# Patient Record
Sex: Female | Born: 1985
Health system: Southern US, Community
[De-identification: ages and names within clinical notes are randomized; demographics above are authoritative.]

## PROBLEM LIST (undated history)

## (undated) DIAGNOSIS — F411 Generalized anxiety disorder: Secondary | ICD-10-CM

## (undated) DIAGNOSIS — Z8659 Personal history of other mental and behavioral disorders: Secondary | ICD-10-CM

## (undated) DIAGNOSIS — G5692 Unspecified mononeuropathy of left upper limb: Secondary | ICD-10-CM

## (undated) DIAGNOSIS — R002 Palpitations: Secondary | ICD-10-CM

## (undated) DIAGNOSIS — G8929 Other chronic pain: Principal | ICD-10-CM

## (undated) DIAGNOSIS — K219 Gastro-esophageal reflux disease without esophagitis: Secondary | ICD-10-CM

## (undated) DIAGNOSIS — E041 Nontoxic single thyroid nodule: Secondary | ICD-10-CM

## (undated) DIAGNOSIS — F909 Attention-deficit hyperactivity disorder, unspecified type: Secondary | ICD-10-CM

## (undated) DIAGNOSIS — F419 Anxiety disorder, unspecified: Secondary | ICD-10-CM

## (undated) DIAGNOSIS — Z972 Presence of dental prosthetic device (complete) (partial): Secondary | ICD-10-CM

## (undated) DIAGNOSIS — I10 Essential (primary) hypertension: Secondary | ICD-10-CM

## (undated) DIAGNOSIS — Z973 Presence of spectacles and contact lenses: Secondary | ICD-10-CM

## (undated) DIAGNOSIS — F32A Depression, unspecified: Secondary | ICD-10-CM

## (undated) DIAGNOSIS — I7300 Raynaud's syndrome without gangrene: Secondary | ICD-10-CM

## (undated) DIAGNOSIS — F329 Major depressive disorder, single episode, unspecified: Secondary | ICD-10-CM

## (undated) HISTORY — DX: Raynaud's syndrome without gangrene: I73.00

## (undated) HISTORY — PX: WISDOM TOOTH EXTRACTION: SHX21

---
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
## 2007-05-10 ENCOUNTER — Other Ambulatory Visit: Admission: RE | Admit: 2007-05-10 | Discharge: 2007-05-10 | Payer: Self-pay | Admitting: Family Medicine

## 2008-05-22 ENCOUNTER — Other Ambulatory Visit: Admission: RE | Admit: 2008-05-22 | Discharge: 2008-05-22 | Payer: Self-pay | Admitting: Family Medicine

## 2009-06-25 ENCOUNTER — Other Ambulatory Visit: Admission: RE | Admit: 2009-06-25 | Discharge: 2009-06-25 | Payer: Self-pay | Admitting: Family Medicine

## 2010-06-16 ENCOUNTER — Other Ambulatory Visit
Admission: RE | Admit: 2010-06-16 | Discharge: 2010-06-16 | Payer: Self-pay | Source: Home / Self Care | Admitting: Family Medicine

## 2011-06-29 ENCOUNTER — Other Ambulatory Visit: Payer: Self-pay | Admitting: Family Medicine

## 2011-06-29 ENCOUNTER — Other Ambulatory Visit (HOSPITAL_COMMUNITY)
Admission: RE | Admit: 2011-06-29 | Discharge: 2011-06-29 | Disposition: A | Discharge status: Home / Self Care | Payer: BC Managed Care – PPO | Source: Ambulatory Visit | Attending: Family Medicine | Admitting: Family Medicine

## 2011-06-29 DIAGNOSIS — Z124 Encounter for screening for malignant neoplasm of cervix: Secondary | ICD-10-CM | POA: Insufficient documentation

## 2011-06-29 DIAGNOSIS — Z1159 Encounter for screening for other viral diseases: Secondary | ICD-10-CM | POA: Insufficient documentation

## 2012-07-19 ENCOUNTER — Other Ambulatory Visit (HOSPITAL_COMMUNITY)
Admission: RE | Admit: 2012-07-19 | Discharge: 2012-07-19 | Disposition: A | Discharge status: Home / Self Care | Payer: BC Managed Care – PPO | Source: Ambulatory Visit | Attending: Family Medicine | Admitting: Family Medicine

## 2012-07-19 ENCOUNTER — Other Ambulatory Visit: Payer: Self-pay | Admitting: Family Medicine

## 2012-07-19 DIAGNOSIS — Z1151 Encounter for screening for human papillomavirus (HPV): Secondary | ICD-10-CM | POA: Insufficient documentation

## 2012-07-19 DIAGNOSIS — Z124 Encounter for screening for malignant neoplasm of cervix: Secondary | ICD-10-CM | POA: Insufficient documentation

## 2012-07-19 DIAGNOSIS — R87810 Cervical high risk human papillomavirus (HPV) DNA test positive: Secondary | ICD-10-CM | POA: Insufficient documentation

## 2013-08-22 ENCOUNTER — Other Ambulatory Visit (HOSPITAL_COMMUNITY)
Admission: RE | Admit: 2013-08-22 | Discharge: 2013-08-22 | Disposition: A | Discharge status: Home / Self Care | Payer: BC Managed Care – PPO | Source: Ambulatory Visit | Attending: Family Medicine | Admitting: Family Medicine

## 2013-08-22 ENCOUNTER — Other Ambulatory Visit: Payer: Self-pay | Admitting: Family Medicine

## 2013-08-22 DIAGNOSIS — Z124 Encounter for screening for malignant neoplasm of cervix: Secondary | ICD-10-CM | POA: Insufficient documentation

## 2014-08-24 ENCOUNTER — Other Ambulatory Visit: Payer: Self-pay | Admitting: Family Medicine

## 2014-08-24 ENCOUNTER — Other Ambulatory Visit (HOSPITAL_COMMUNITY)
Admission: RE | Admit: 2014-08-24 | Discharge: 2014-08-24 | Disposition: A | Discharge status: Home / Self Care | Payer: No Typology Code available for payment source | Source: Ambulatory Visit | Attending: Family Medicine | Admitting: Family Medicine

## 2014-08-24 DIAGNOSIS — Z124 Encounter for screening for malignant neoplasm of cervix: Secondary | ICD-10-CM | POA: Insufficient documentation

## 2014-08-30 LAB — CYTOLOGY - PAP

## 2015-07-15 ENCOUNTER — Encounter (HOSPITAL_COMMUNITY): Payer: Self-pay

## 2015-07-15 ENCOUNTER — Emergency Department (INDEPENDENT_AMBULATORY_CARE_PROVIDER_SITE_OTHER)
Admission: EM | Admit: 2015-07-15 | Discharge: 2015-07-15 | Disposition: A | Discharge status: Home / Self Care | Payer: BLUE CROSS/BLUE SHIELD | Source: Home / Self Care

## 2015-07-15 DIAGNOSIS — J069 Acute upper respiratory infection, unspecified: Secondary | ICD-10-CM

## 2015-07-15 LAB — POCT RAPID STREP A: Streptococcus, Group A Screen (Direct): NEGATIVE

## 2015-07-15 MED ORDER — HYDROCODONE-HOMATROPINE 5-1.5 MG/5ML PO SYRP: 5.0000 mL | ORAL_SOLUTION | Freq: Four times a day (QID) | ORAL | Status: DC | PRN

## 2015-07-15 NOTE — Discharge Instructions (Signed)
Upper Respiratory Infection, Adult We have diagnosed U with a mild upper respiratory infection Your strep test was non-suggestive of strep throat There are many causes for upper respiratory illness and most of them are viral Please contact your physician if you or no better in a week and consider getting a chest x-ray and labs however from my standpoint I think you're stable and we will send home on a cough suppressant   Most upper respiratory infections (URIs) are a viral infection of the air passages leading to the lungs. A URI affects the nose, throat, and upper air passages. The most common type of URI is nasopharyngitis and is typically referred to as "the common cold." URIs run their course and usually go away on their own. Most of the time, a URI does not require medical attention, but sometimes a bacterial infection in the upper airways can follow a viral infection. This is called a secondary infection. Sinus and middle ear infections are common types of secondary upper respiratory infections. Bacterial pneumonia can also complicate a URI. A URI can worsen asthma and chronic obstructive pulmonary disease (COPD). Sometimes, these complications can require emergency medical care and may be life threatening.  CAUSES Almost all URIs are caused by viruses. A virus is a type of germ and can spread from one person to another.  RISKS FACTORS You may be at risk for a URI if:   You smoke.   You have chronic heart or lung disease.  You have a weakened defense (immune) system.   You are very young or very old.   You have nasal allergies or asthma.  You work in crowded or poorly ventilated areas.  You work in health care facilities or schools. SIGNS AND SYMPTOMS  Symptoms typically develop 2-3 days after you come in contact with a cold virus. Most viral URIs last 7-10 days. However, viral URIs from the influenza virus (flu virus) can last 14-18 days and are typically more severe. Symptoms  may include:   Runny or stuffy (congested) nose.   Sneezing.   Cough.   Sore throat.   Headache.   Fatigue.   Fever.   Loss of appetite.   Pain in your forehead, behind your eyes, and over your cheekbones (sinus pain).  Muscle aches.  DIAGNOSIS  Your health care provider may diagnose a URI by:  Physical exam.  Tests to check that your symptoms are not due to another condition such as:  Strep throat.  Sinusitis.  Pneumonia.  Asthma. TREATMENT  A URI goes away on its own with time. It cannot be cured with medicines, but medicines may be prescribed or recommended to relieve symptoms. Medicines may help:  Reduce your fever.  Reduce your cough.  Relieve nasal congestion. HOME CARE INSTRUCTIONS   Take medicines only as directed by your health care provider.   Gargle warm saltwater or take cough drops to comfort your throat as directed by your health care provider.  Use a warm mist humidifier or inhale steam from a shower to increase air moisture. This may make it easier to breathe.  Drink enough fluid to keep your urine clear or pale yellow.   Eat soups and other clear broths and maintain good nutrition.   Rest as needed.   Return to work when your temperature has returned to normal or as your health care provider advises. You may need to stay home longer to avoid infecting others. You can also use a face mask and careful hand  washing to prevent spread of the virus.  Increase the usage of your inhaler if you have asthma.   Do not use any tobacco products, including cigarettes, chewing tobacco, or electronic cigarettes. If you need help quitting, ask your health care provider. PREVENTION  The best way to protect yourself from getting a cold is to practice good hygiene.   Avoid oral or hand contact with people with cold symptoms.   Wash your hands often if contact occurs.  There is no clear evidence that vitamin C, vitamin E, echinacea, or  exercise reduces the chance of developing a cold. However, it is always recommended to get plenty of rest, exercise, and practice good nutrition.  SEEK MEDICAL CARE IF:   You are getting worse rather than better.   Your symptoms are not controlled by medicine.   You have chills.  You have worsening shortness of breath.  You have brown or red mucus.  You have yellow or brown nasal discharge.  You have pain in your face, especially when you bend forward.  You have a fever.  You have swollen neck glands.  You have pain while swallowing.  You have white areas in the back of your throat. SEEK IMMEDIATE MEDICAL CARE IF:   You have severe or persistent:  Headache.  Ear pain.  Sinus pain.  Chest pain.  You have chronic lung disease and any of the following:  Wheezing.  Prolonged cough.  Coughing up blood.  A change in your usual mucus.  You have a stiff neck.  You have changes in your:  Vision.  Hearing.  Thinking.  Mood. MAKE SURE YOU:   Understand these instructions.  Will watch your condition.  Will get help right away if you are not doing well or get worse.   This information is not intended to replace advice given to you by your health care provider. Make sure you discuss any questions you have with your health care provider.   Document Released: 12/23/2000 Document Revised: 11/13/2014 Document Reviewed: 10/04/2013 Elsevier Interactive Patient Education [employer]

## 2015-07-15 NOTE — ED Notes (Signed)
Patient complains of having a cough for the past  Five days Not able to sleep because all she does is cough Patient states having fever on an off as well

## 2015-07-15 NOTE — ED Provider Notes (Signed)
CSN: [id number]     Arrival date & time 07/15/15  1305 History   None    Chief Complaint  Patient presents with  . Cough    HPI   30 year old female No significant past medical history presents with cold Tells me that when she has a cold this is typical for her She has had however fever as high as 101 No sick contacts Works in [employer] therefore exposed to many sick people No myalgias No chest pain however has pain on coughing No nausea No vomiting No blurred or double vision however does feel some throat tenderness and discomfort and feels like her glands are swollen No dysuria No diarrhea   History reviewed. No pertinent past medical history. History reviewed. No pertinent past surgical history. No family history on file. Social History  Substance Use Topics  . Smoking status: None  . Smokeless tobacco: None  . Alcohol Use: None   OB History    No data available     Review of Systems  Allergies  Review of patient's allergies indicates no known allergies.  Home Medications   Prior to Admission medications   Not on File   Meds Ordered and Administered this Visit  Medications - No data to display  BP 131/80 mmHg  Pulse 97  Temp(Src) 98.6 F (37 C) (Oral)  Resp 18  SpO2 100% No data found.   Physical Exam EOMI NCAT slightly tired-appearing female no icterus no pallor Slightly injected bilateral fauces TMs are bilaterally slightly swollen however no insufflation test was done Chest is clinically clear with no added sound no rails no rhonchi Abdomen is soft nontender nondistended no rebound No lower extremity edema   ED Course  Procedures (including critical care time)  Labs Review Labs Reviewed - No data to display  Imaging Review No results found.   Visual Acuity Review  Right Eye Distance:   Left Eye Distance:   Bilateral Distance:    Right Eye Near:   Left Eye Near:    Bilateral Near:         MDM  No diagnosis  found. Impression  Possible upper respiratory illness likely viral We will get rapid strep I will prescribe dextromethorphan to reduce cough symptoms at night  Her strep test is negative and therefore we will treat this as a viral illness Return precautions have been stressed She should follow-up with her primary care physician if no better within the next 2-4 days  Pleas KochJai Nikalas Bramel, MD [facility] [phone]) [phone]     Rhetta MuraJai-Gurmukh Dawnette Mione, MD 07/15/15 1421

## 2015-07-17 LAB — CULTURE, GROUP A STREP

## 2015-08-29 ENCOUNTER — Other Ambulatory Visit (HOSPITAL_COMMUNITY)
Admission: RE | Admit: 2015-08-29 | Discharge: 2015-08-29 | Disposition: A | Discharge status: Home / Self Care | Payer: BLUE CROSS/BLUE SHIELD | Source: Ambulatory Visit | Attending: Family Medicine | Admitting: Family Medicine

## 2015-08-29 ENCOUNTER — Other Ambulatory Visit: Payer: Self-pay | Admitting: Family Medicine

## 2015-08-29 DIAGNOSIS — Z01411 Encounter for gynecological examination (general) (routine) with abnormal findings: Secondary | ICD-10-CM | POA: Diagnosis present

## 2015-09-02 LAB — CYTOLOGY - PAP

## 2016-06-03 DIAGNOSIS — M5412 Radiculopathy, cervical region: Secondary | ICD-10-CM | POA: Diagnosis not present

## 2016-06-03 DIAGNOSIS — G5602 Carpal tunnel syndrome, left upper limb: Secondary | ICD-10-CM | POA: Diagnosis not present

## 2016-06-22 DIAGNOSIS — I7300 Raynaud's syndrome without gangrene: Secondary | ICD-10-CM | POA: Diagnosis not present

## 2016-06-22 DIAGNOSIS — M5412 Radiculopathy, cervical region: Secondary | ICD-10-CM | POA: Diagnosis not present

## 2016-06-27 ENCOUNTER — Encounter (HOSPITAL_COMMUNITY): Payer: Self-pay | Admitting: Emergency Medicine

## 2016-06-27 ENCOUNTER — Emergency Department (HOSPITAL_COMMUNITY)
Admission: EM | Admit: 2016-06-27 | Discharge: 2016-06-27 | Disposition: A | Discharge status: Home / Self Care | Payer: BLUE CROSS/BLUE SHIELD | Attending: Emergency Medicine | Admitting: Emergency Medicine

## 2016-06-27 DIAGNOSIS — G5602 Carpal tunnel syndrome, left upper limb: Secondary | ICD-10-CM | POA: Insufficient documentation

## 2016-06-27 DIAGNOSIS — M25512 Pain in left shoulder: Secondary | ICD-10-CM | POA: Diagnosis not present

## 2016-06-27 MED ORDER — LORAZEPAM 0.5 MG PO TABS: 1.0000 mg | ORAL_TABLET | Freq: Three times a day (TID) | ORAL | 0 refills | Status: DC | PRN

## 2016-06-27 MED ORDER — HYDROCODONE-ACETAMINOPHEN 5-325 MG PO TABS: 2.0000 | ORAL_TABLET | Freq: Four times a day (QID) | ORAL | 0 refills | Status: DC | PRN

## 2016-06-27 NOTE — Discharge Instructions (Signed)
Please read attached information. If you experience any new or worsening signs or symptoms please return to the emergency room for evaluation. Please follow-up with your primary care provider or specialist as discussed. Please use medication prescribed only as directed and discontinue taking if you have any concerning signs or symptoms. Do not take prescribed medications together as they can cause significant life-threatening complications. Please allow at least 6 hours in between medication administration.

## 2016-06-27 NOTE — ED Provider Notes (Signed)
[facility]-EMERGENCY DEPT Provider Note   CSN: [id number] Arrival date & time: 06/27/16  1616  By signing my name below, I, Vista Minkobert Ross, attest that this documentation has been prepared under the direction and in the presence of H&R BlockJeffrey Anaysha Andre PA-C.  Electronically Signed: Vista Minkobert Ross, ED Scribe. 06/27/16. 6:45 PM.  History   Chief Complaint Chief Complaint  Patient presents with  . Shoulder Pain  . Hand Pain   HPI HPI Comments: Michelle Villa is a 30 y.o. female, with no pertinent PMHx, who presents to the Emergency Department complaining of gradually worsening, constant left shoulder and left hand pain and intermittent numbness that started approximately 3 weeks ago. Pt states that these symptoms started as a dull ache in her left shoulder for one week and has progressively gotten worse, started to have numbness and pain in her left hand. Pt states, "it feels like there is a knot below my left shoulder blade". Pt saw her PCP immediately when the intermittent numbness in her fingers started approximately 3 weeks ago. She was prescribed Prednisone and Hydrocodone for her symptoms which only mildly alleviates the symptoms for a short period. She is scheduled to follow up with Neurologist, Dr. Lucia GaskinsAhern, on 07/21/15.   The history is provided by the patient. No language interpreter was used.    History reviewed. No pertinent past medical history.  There are no active problems to display for this patient.   Past Surgical History:  Procedure Laterality Date  . WISDOM TOOTH EXTRACTION      OB History    No data available       Home Medications    Prior to Admission medications   Medication Sig Start Date End Date Taking? Authorizing Provider  HYDROcodone-acetaminophen (NORCO/VICODIN) 5-325 MG tablet Take 2 tablets by mouth every 6 (six) hours as needed. 06/27/16   Tinnie GensJeffrey Mustapha Colson, PA-C  HYDROcodone-homatropine (HYCODAN) 5-1.5 MG/5ML syrup Take 5 mLs by mouth every 6 (six) hours as  needed for cough. 07/15/15   Rhetta MuraJai-Gurmukh Samtani, MD  LORazepam (ATIVAN) 0.5 MG tablet Take 2 tablets (1 mg total) by mouth 3 (three) times daily as needed for anxiety. 06/27/16   Eyvonne MechanicJeffrey Edi Gorniak, PA-C    Family History Family History  Problem Relation Age of Onset  . Hypertension Father   . Hyperlipidemia Father     Social History Social History  Substance Use Topics  . Smoking status: Never Smoker  . Smokeless tobacco: Never Used  . Alcohol use 0.6 oz/week    1 Glasses of wine per week    Allergies   Patient has no known allergies.   Review of Systems Review of Systems A complete 10 system review of systems was obtained and all systems are negative except as noted in the HPI and PMH.    Physical Exam Updated Vital Signs BP 150/94 (BP Location: Right Arm)   Pulse 95   Temp 98.7 F (37.1 C) (Oral)   Resp 18   Ht 5\' 3"  (1.6 m)   Wt 131 lb 2 oz (59.5 kg)   LMP 05/30/2016 (Approximate)   SpO2 100%   BMI 23.23 kg/m   Physical Exam  Constitutional: She is oriented to person, place, and time. She appears well-developed and well-nourished. No distress.  HENT:  Head: Normocephalic and atraumatic.  Neck: Normal range of motion.  Pulmonary/Chest: Effort normal.  Musculoskeletal: Normal range of motion. She exhibits tenderness.  TTP of the left trapezius and surrounding scapular musculature. No rashes, swelling or deformities noted. Full active  ROM. No pain or neuro with axial compression.   Left arm grip strength 5/5, sensation grossly intact. Reduced sensation 1st,2nd and 3rd digits. Minor pain to compression over carpal tunnel.  Neurological: She is alert and oriented to person, place, and time.  Skin: Skin is warm and dry. She is not diaphoretic.  Psychiatric: She has a normal mood and affect. Judgment normal.  Nursing note and vitals reviewed.   ED Treatments / Results  DIAGNOSTIC STUDIES: Oxygen Saturation is 100% on RA, normal by my  interpretation.  COORDINATION OF CARE: 6:44 PM-Discussed treatment plan with pt at bedside and pt agreed to plan.   Labs (all labs ordered are listed, but only abnormal results are displayed) Labs Reviewed - No data to display  EKG  EKG Interpretation None       Radiology No results found.  Procedures Procedures (including critical care time)  Medications Ordered in ED Medications - No data to display   Initial Impression / Assessment and Plan / ED Course  I have reviewed the triage vital signs and the nursing notes.  Pertinent labs & imaging results that were available during my care of the patient were reviewed by me and considered in my medical decision making (see chart for details).  Clinical Course     Patient presents with pain in her shoulder and arm and hand. She has associated numbness in the first 3 digits. Patient is very anxious, and as her exam goes on she becomes more and more anxious. She is very concerned about the pain, and does not provide a clear history. She originally noted pain followed by neurological complaints at week later, later in the exam she reports typical started the same time. Patient has a normal neuro exam with slightly decreased sensation of the first 3 digits. She also has pain over the carpal tunnel. Patient most concerned about being able to continue exercising and working. I have low suspicion for significant pathology in this patient, likely muscle spasm or impingement. She has no involvement of the neck or spine. Patient and he has a neurology follow-up, she is encouraged to follow up with a neurologist. Due to significant anxiety and questionable spasm of the scapular muscle  she will be given a benzodiazepine. Patient will also be given short course of hydrocodone. She was extensively counseled on not using these 2 medications together as they can potentially cause life-threatening problems. Both the patient and her fianc verbalized her  understanding and agreement to today's plan had no further questions or concerns at time of discharge  Final Clinical Impressions(s) / ED Diagnoses   Final diagnoses:  Acute pain of left shoulder  Carpal tunnel syndrome of left wrist    New Prescriptions Discharge Medication List as of 06/27/2016  7:00 PM    START taking these medications   Details  HYDROcodone-acetaminophen (NORCO/VICODIN) 5-325 MG tablet Take 2 tablets by mouth every 6 (six) hours as needed., Starting Sat 06/27/2016, Print    LORazepam (ATIVAN) 0.5 MG tablet Take 2 tablets (1 mg total) by mouth 3 (three) times daily as needed for anxiety., Starting Sat 06/27/2016, Print        I personally performed the services described in this documentation, which was scribed in my presence. The recorded information has been reviewed and is accurate.    Eyvonne MechanicJeffrey Auther Lyerly, PA-C 06/28/16 1115    Donnetta HutchingBrian Cook, MD 07/03/16 [phone]

## 2016-06-27 NOTE — ED Triage Notes (Signed)
Pt reports pain in l/shoulder radiating to l/arm and hand x 3 weeks. PCP scheduled appointment for January 6 with neurologist. Pt has been using pain medication for spasms in l/hand and arm. Pt is requesting evaluation for harp pains in hands today.

## 2016-07-02 DIAGNOSIS — G5602 Carpal tunnel syndrome, left upper limb: Secondary | ICD-10-CM | POA: Diagnosis not present

## 2016-07-13 DIAGNOSIS — I7789 Other specified disorders of arteries and arterioles: Secondary | ICD-10-CM

## 2016-07-13 DIAGNOSIS — E042 Nontoxic multinodular goiter: Secondary | ICD-10-CM

## 2016-07-13 DIAGNOSIS — I999 Unspecified disorder of circulatory system: Secondary | ICD-10-CM

## 2016-07-13 HISTORY — DX: Nontoxic multinodular goiter: E04.2

## 2016-07-13 HISTORY — DX: Other specified disorders of arteries and arterioles: I77.89

## 2016-07-13 HISTORY — DX: Unspecified disorder of circulatory system: I99.9

## 2016-07-20 ENCOUNTER — Encounter: Payer: Self-pay | Admitting: Neurology

## 2016-07-20 ENCOUNTER — Ambulatory Visit (INDEPENDENT_AMBULATORY_CARE_PROVIDER_SITE_OTHER): Payer: BLUE CROSS/BLUE SHIELD | Admitting: Neurology

## 2016-07-20 VITALS — BP 132/85 | HR 91 | Resp 20 | Ht 63.0 in | Wt 146.0 lb

## 2016-07-20 DIAGNOSIS — I7300 Raynaud's syndrome without gangrene: Secondary | ICD-10-CM | POA: Diagnosis not present

## 2016-07-20 DIAGNOSIS — D7589 Other specified diseases of blood and blood-forming organs: Secondary | ICD-10-CM

## 2016-07-20 DIAGNOSIS — M5010 Cervical disc disorder with radiculopathy, unspecified cervical region: Secondary | ICD-10-CM

## 2016-07-20 DIAGNOSIS — I742 Embolism and thrombosis of arteries of the upper extremities: Secondary | ICD-10-CM

## 2016-07-20 DIAGNOSIS — R202 Paresthesia of skin: Secondary | ICD-10-CM

## 2016-07-20 DIAGNOSIS — R29898 Other symptoms and signs involving the musculoskeletal system: Secondary | ICD-10-CM | POA: Diagnosis not present

## 2016-07-20 NOTE — Patient Instructions (Addendum)
Remember to drink plenty of fluid, eat healthy meals and do not skip any meals. Try to eat protein with a every meal and eat a healthy snack such as fruit or nuts in between meals. Try to keep a regular sleep-wake schedule and try to exercise daily, particularly in the form of walking, 20-30 minutes a day, if you can.   As far as diagnostic testing: MRI cervical spine, emg/ncs and physical therpay  I would like to see you back for emg/ncs, sooner if we need to. Please call us with any interim questions, concerns, problems, updates or refill requests.   Do not lift more than 5 pounds.   Our phone number is [phone]. We also have an after hours call service for urgent matters and there is a physician on-call for urgent questions. For any emergencies you know to call 911 or go to the nearest emergency room

## 2016-07-20 NOTE — Progress Notes (Addendum)
[name]    Provider:  Dr Lucia Gaskins Referring Provider: Shirlean Mylar, MD Primary Care Physician:  Frederich Chick, MD  CC:  Constant hand/shoulder pain, cold fingers go numb  Addendum: emg/ncs was negative. US revealed Ulnar artery thrombosis and vascular consult pending. MRI of the cervical spine shows fatty marrow changes. Artery occlusion and cervical fatty marrow changes both highly unusual in a 31 year old otherwise healthy female. Will refer to hematology for evaluation.   HPI:  Michelle Villa is a 31 y.o. female here as a referral from Dr. Hyman Hopes for possible cervical radiculopathy. Past medical history of dysmenorrhea, is also a Academic librarian and takes vitamin B12.Her left 3 fingers go blue, digits 1-3 and hurt. It started without inciting events except maybe she has slipped multiple times at work and symptoms started before Thanksgiving and progressively worsening. She also has pain in her shoulder she has a knot on the back and when she presses on it it feels better but makes her fingers tingle. She feels it constantly. Hurts with use and radiates into the forearm and hand. Her left hand feels like a block of ice. She wakes up in the middle of the night with pain in her hand. She has gone to a chiropractor, had massages, nothing is helping maybe temporarily but nothing has resolved the symptoms. The pain starts in the back of the left shoulder. It can be severe in pain. She feels her left arm is weak and she is having difficulty picking up objects and drops things. It is affecting her life as she works in [employer] and difficulty with serving food. Digits on the left hand 1-3 feel tingling, numb, cold, she sleeps with a carpal tunnel brace at night but still feels like her hand is falling asleep. Progressively worsening and continuous all day long. She has had at least 6 weeks of chiropractor, massage and medical management with steroids and percocet.   Reviewed notes, labs and  imaging from outside physicians, which showed:  Reviewed primary care notes. Patient complains of pain that starts her left shoulder blade and radiates down her left arm and left hand. Her left hand is much colder than her right hand. Patient's been seen several times for the same issue. Pain is becoming even worse and more persistent. She was given hydrocodone acetaminophen, prednisone, and referral with neurology and an EMG test was recommended. An MRI of the cervical spine was also ordered. She was diagnosed with Raynaud's phenomena, noted this is benign, good blood flow and exam, advised to keep hands warm, also given alprazolam before MRI. Patient was also seen in the emergency room 06/27/2016 with gradually worsening constant left shoulder and left hand pain and intermittent numbness. She reported a dull ache in the left shoulder for 1 week and numbness and pain in her left hand. She was prescribed prednisone and hydrocodone for her symptoms which only mildly alleviated symptoms. Exam showed tenderness of the left trapezius since around the scapular musculature. No rashes swelling or deformities noted. Full active range of motion.  Labs: rapid strep 07/15/2015 negative  Review of Systems: Patient complains of symptoms per HPI as well as the following symptoms: Numbness, weakness, trouble sleeping. . Pertinent negatives per HPI. All others negative.   Social History   Social History  . Marital status: Single    Spouse name: N/A  . Number of children: N/A  . Years of education: N/A   Occupational History  . waitress  Social History Main Topics  . Smoking status: Never Smoker  . Smokeless tobacco: Never Used  . Alcohol use 4.2 oz/week    7 Standard drinks or equivalent per week  . Drug use:     Types: Marijuana  . Sexual activity: Not on file   Other Topics Concern  . Not on file   Social History Narrative  . No narrative on file    Family History  Problem Relation Age of  Onset  . Hypertension Father   . Hyperlipidemia Father   . Neuropathy Neg Hx     Past Medical History:  Diagnosis Date  . Raynaud phenomenon     Past Surgical History:  Procedure Laterality Date  . WISDOM TOOTH EXTRACTION      Current Outpatient Prescriptions  Medication Sig Dispense Refill  . Cyanocobalamin (VITAMIN B-12) 3000 MCG SUBL Place under the tongue.    . drospirenone-ethinyl estradiol (YASMIN,ZARAH,SYEDA) 3-0.03 MG tablet Take 1 tablet by mouth daily.    Marland Kitchen. ibuprofen (ADVIL,MOTRIN) 100 MG tablet Take 100 mg by mouth every 6 (six) hours as needed for fever.    . Multiple Vitamins-Minerals (ECHINACEA ACZ PO) Take by mouth.    . Multiple Vitamins-Minerals (MULTI COMPLETE PO) Take by mouth.     No current facility-administered medications for this visit.     Allergies as of 07/20/2016  . (No Known Allergies)    Vitals: BP 132/85   Pulse 91   Resp 20   Ht 5\' 3"  (1.6 m)   Wt 146 lb (66.2 kg)   LMP 05/30/2016 (Approximate)   BMI 25.86 kg/m  Last Weight:  Wt Readings from Last 1 Encounters:  07/20/16 146 lb (66.2 kg)   Last Height:   Ht Readings from Last 1 Encounters:  07/20/16 5\' 3"  (1.6 m)   Physical exam: Exam: Gen: NAD, conversant, well nourised, obese, well groomed                     CV: RRR, no MRG. No Carotid Bruits. No peripheral edema, warm, nontender Eyes: Conjunctivae clear without exudates or hemorrhage  Neuro: Detailed Neurologic Exam  Speech:    Speech is normal; fluent and spontaneous with normal comprehension.  Cognition:    The patient is oriented to person, place, and time;     recent and remote memory intact;     language fluent;     normal attention, concentration,     fund of knowledge Cranial Nerves:    The pupils are equal, round, and reactive to light. The fundi are normal and spontaneous venous pulsations are present. Visual fields are full to finger confrontation. Extraocular movements are intact. Trigeminal sensation is  intact and the muscles of mastication are normal. The face is symmetric. The palate elevates in the midline. Hearing intact. Voice is normal. Shoulder shrug is normal. The tongue has normal motion without fasciculations.   Coordination:    Normal finger to nose and heel to shin. Normal rapid alternating movements.   Gait:    Heel-toe and tandem gait are normal.   Motor Observation:    No asymmetry, no atrophy, and no involuntary movements noted.Tenderness of palpation of the left trapezius and scapular muscles, full range of motion  Tone:    Normal muscle tone.    Posture:    Posture is normal. normal erect    Strength:    Left deltoid 4/5, decreased grip strength and interossei/opponens strength of the left hand.  Sensation: dec left hand     Reflex Exam:  DTR's:    Deep tendon reflexes in the upper and lower extremities are normal bilaterally.   Toes:    The toes are downgoing bilaterally.   Clonus:    Clonus is absent.  + Tinel sign and +Phalen's maneuver     Assessment/Plan:  31 year old with 2 months of left hand paresthesias and weakness, left arm weakness, radiating pain, she has tried medical management, chiropractor, massage therapy for at least 6 weeks. Needs MRI of the cervical spine to evaluate for cervical radiculopathy for surgical intervention or epidural steroid injection therapy, also an EMG nerve conduction study of the bilateral upper extremities, we will refer to physical therapy as well. Do not lift more than 5 punds, continue to wear wrist braces at night. + Tinel sign and +Phalen's maneuver.  Addendum: emg/ncs was negative. US revealed Ulnar artery thrombosis and vascular consult pending. MRI of the cervical spine shows fatty marrow changes. Artery occlusion and cervical fatty marrow changes both highly unusual in a 31 year old otherwise healthy female. Will refer to hematology for evaluation.    Cc: Dr. Yolande Jolly, MD  [facility] [address] [place], [place] [address]  Phone [phone] Fax [phone]

## 2016-07-21 ENCOUNTER — Telehealth: Payer: Self-pay | Admitting: Neurology

## 2016-07-21 ENCOUNTER — Telehealth: Payer: Self-pay | Admitting: *Deleted

## 2016-07-21 NOTE — Telephone Encounter (Signed)
Called and spoke to pt fiance (on DPR), Sharlet SalinaBenjamin. Advised we have openings for appt tomorrow, check in 315pm, Thursday, check in 930am and Friday, check in 8am. He is going to try and get a hold of the patient and call back to let us know if these work. Advised them to call [facility] phone number. He verbalized understanding.

## 2016-07-21 NOTE — Telephone Encounter (Signed)
Called and LVM for pt to call back. Offered EMG.NCS appt with Dr Terrace ArabiaYan this Friday at 815am, check in 800am. Asked her to call back ASAP to let us know if this works. Gave [facility] phone number.

## 2016-07-21 NOTE — Telephone Encounter (Signed)
Pt returned Emma's call  She said 8 am on Friday will work

## 2016-07-21 NOTE — Telephone Encounter (Signed)
[employer] did not approve the MRI, [facility] and she said she was unable to approve it on her level due to the patient has not had 6 weeks of physical therapy. The phone number for the peer to peer is [phone] and the member ID is [id number] & DOB is May 11, 1986. Please do in 2 business days. Thank you for your help!

## 2016-07-21 NOTE — Telephone Encounter (Signed)
Schedule pt for Friday at 815am with Dr Terrace ArabiaYan. Pt advised to check in at 8am.

## 2016-07-21 NOTE — Telephone Encounter (Signed)
LVM again for pt to call and schedule EMG/NCS. Gave [facility] phone number.

## 2016-07-22 ENCOUNTER — Telehealth: Payer: Self-pay | Admitting: Neurology

## 2016-07-22 NOTE — Telephone Encounter (Signed)
Patient is scheduled to have her MRI on 07/29/16 at our [facility] mobile unit. But she informed me that she is somewhat claustrophobia and needs something to calm her nerves.

## 2016-07-22 NOTE — Telephone Encounter (Signed)
Dr Lucia GaskinsAhern- ok to give xanax for MRI?

## 2016-07-23 ENCOUNTER — Ambulatory Visit: Payer: BLUE CROSS/BLUE SHIELD | Admitting: Physical Therapy

## 2016-07-23 MED ORDER — ALPRAZOLAM 0.5 MG PO TABS: ORAL_TABLET | ORAL | 0 refills | Status: DC

## 2016-07-23 NOTE — Telephone Encounter (Signed)
Noted, thank you for your help!  °

## 2016-07-23 NOTE — Telephone Encounter (Signed)
Faxed printed/signed rx to pt pharmacy. Received confirmation. 

## 2016-07-23 NOTE — Telephone Encounter (Signed)
Called and spoke to pt. Advised we sent in rx xanax to take prior to MRI. Went over instructions and advised she must have a driver to and from the test. Can cause sedation. She verbalized understanding.

## 2016-07-23 NOTE — Telephone Encounter (Signed)
Printed rx, awaiting AA,MD signature 

## 2016-07-23 NOTE — Telephone Encounter (Signed)
That's fine. thanks

## 2016-07-23 NOTE — Telephone Encounter (Signed)
Authorized [id number] [phone] 2018

## 2016-07-24 ENCOUNTER — Ambulatory Visit (INDEPENDENT_AMBULATORY_CARE_PROVIDER_SITE_OTHER): Payer: BLUE CROSS/BLUE SHIELD | Admitting: Neurology

## 2016-07-24 ENCOUNTER — Ambulatory Visit (INDEPENDENT_AMBULATORY_CARE_PROVIDER_SITE_OTHER): Payer: Self-pay | Admitting: Neurology

## 2016-07-24 DIAGNOSIS — R202 Paresthesia of skin: Secondary | ICD-10-CM | POA: Diagnosis not present

## 2016-07-24 DIAGNOSIS — R29898 Other symptoms and signs involving the musculoskeletal system: Secondary | ICD-10-CM

## 2016-07-24 DIAGNOSIS — M542 Cervicalgia: Secondary | ICD-10-CM | POA: Diagnosis not present

## 2016-07-24 DIAGNOSIS — Z0289 Encounter for other administrative examinations: Secondary | ICD-10-CM

## 2016-07-24 DIAGNOSIS — M5010 Cervical disc disorder with radiculopathy, unspecified cervical region: Secondary | ICD-10-CM

## 2016-07-24 NOTE — Procedures (Signed)
        Full Name: Michelle Villa Gender: Female MRN #: [id number] Date of Birth: [birth year]    Visit Date: 07/24/2016 08:22 Age: 31 Years 7 Months Old Examining Physician: Naomie DeanAntonia Ahern, MD  Referring Physician: Lucia GaskinsAhern History: 31 years old right-handed female presenting subacute onset of left first 3 finger numbness, purplish discoloration, subjective weakness, left neck pain.    Summary of test:  Nerve conduction study: Left median, ulnar sensory and motor responses were normal.  Electromyography: Selected needle examination of left upper extremity and left cervical paraspinal muscles was normal.  Conclusion: This is a normal study, there is no electrodiagnostic evidence of left upper extremity neuropathy or left cervical radiculopathy.    ------------------------------- Levert FeinsteinYijun Heidemarie Goodnow, M.D.  [facility] [address] [place], [place] [address] Tel: [phone] Fax: [phone]        [facility]    Nerve / Sites Rec. Site Peak Lat Ref.  Amp Ref. Segments Distance    ms ms V V  cm  L Median - Orthodromic (Dig II, Mid palm)     Dig II Wrist 2.8 ?3.4 33 ?10 Dig II - Wrist 13  L Ulnar - Orthodromic, (Dig V, Mid palm)     Dig V Wrist 2.6 ?3.1 15 ?5 Dig V - Wrist 11         [facility]    Nerve / Sites Muscle Latency Ref. Amplitude Ref. Rel Amp Segments Distance Lat Diff Velocity Ref. Area    ms ms mV mV %  cm ms m/s m/s mVms  L Median - APB     Wrist APB 2.9 ?4.4 8.0 ?4.0 100 Wrist - APB 7    36.3     Upper arm APB 6.7  8.2  102 Upper arm - Wrist 22 3.8 58  36.5  L Ulnar - ADM     Wrist ADM 2.3 ?3.3 10.5 ?6.0 100 Wrist - ADM 7    38.6     B.Elbow ADM 4.8  8.6  82.4 B.Elbow - Wrist 15 2.6 59 ?49 33.9     A.Elbow ADM 6.7  9.5  110 A.Elbow - B.Elbow 10 1.9 53 ?49 38.1         A.Elbow - Wrist  4.4            F  Wave    Nerve F Lat Ref.   ms ms  L Median - APB 24.7 ?31.0  L Ulnar - ADM 24.9 ?32.0         EMG full       EMG Summary Table    Spontaneous  MUAP Recruitment  Muscle IA Fib PSW Fasc Other Amp Dur. Poly Pattern  L. First dorsal interosseous Normal None None None _______ Normal Normal Normal Normal  L. Pronator teres Normal None None None _______ Normal Normal Normal Normal  L. Extensor digitorum communis Normal None None None _______ Normal Normal Normal Normal  L. Brachioradialis Normal None None None _______ Normal Normal Normal Normal  L. Biceps brachii Normal None None None _______ Normal Normal Normal Normal  L. Deltoid Normal None None None _______ Normal Normal Normal Normal  L. Triceps brachii Normal None None None _______ Normal Normal Normal Normal  L. Cervical paraspinals Normal None None None _______ Normal Normal Normal Normal

## 2016-07-27 ENCOUNTER — Encounter: Payer: Self-pay | Admitting: Physical Therapy

## 2016-07-27 ENCOUNTER — Ambulatory Visit: Payer: BLUE CROSS/BLUE SHIELD | Attending: Neurology | Admitting: Physical Therapy

## 2016-07-27 DIAGNOSIS — M542 Cervicalgia: Secondary | ICD-10-CM | POA: Insufficient documentation

## 2016-07-27 DIAGNOSIS — M6281 Muscle weakness (generalized): Secondary | ICD-10-CM | POA: Diagnosis not present

## 2016-07-27 DIAGNOSIS — M25512 Pain in left shoulder: Secondary | ICD-10-CM

## 2016-07-28 NOTE — Therapy (Signed)
[facility] [address] [place], [place], [address] Phone: [phone]   Fax:  [phone]  Physical Therapy Evaluation  Patient Details  Name: Michelle Villa MRN: [id number] Date of Birth: [birth year] Referring Provider: Anson FretAntonia B Ahern, MD  Encounter Date: 07/27/2016      PT End of Session - 07/28/16 0729    Visit Number 1   Date for PT Re-Evaluation 09/22/16   PT Start Time 1145   PT Stop Time 1225   PT Time Calculation (min) 40 min   Activity Tolerance Patient tolerated treatment well   Behavior During Therapy [facility] for tasks assessed/performed      Past Medical History:  Diagnosis Date  . Raynaud phenomenon     Past Surgical History:  Procedure Laterality Date  . WISDOM TOOTH EXTRACTION      There were no vitals filed for this visit.       Subjective Assessment - 07/27/16 1150    Subjective Lt neck pain and Lt arm goes numb and pain in forearm.  Hurts any time she uses the arm.  Resting it is about 6/10, but over 10/10 by the end of the night if she has to do a lot.     Limitations Lifting;Other (comment)   Currently in Pain? Yes   Pain Score 10-Worst pain ever   Pain Location Shoulder   Pain Orientation Left;Upper   Pain Descriptors / Indicators Aching;Sharp;Radiating;Shooting;Burning;Tingling   Pain Type Acute pain   Pain Radiating Towards down to arm and into first three fingers   Pain Onset More than a month ago   Pain Frequency Constant   Aggravating Factors  lifting or grabbing   Pain Relieving Factors resting and not using arm   Effect of Pain on Daily Activities can't do normal activities   Multiple Pain Sites No            [facility] PT Assessment - 07/28/16 0001      Assessment   Medical Diagnosis R29.898 Lt arm weakness; M50.10 cervical disc disorder with radiculopathy of cervical region; R20.2 Lt hand parasthesia   Onset Date/Surgical Date --  November 2017   Hand  Dominance Right   Prior Therapy None     Precautions   Precautions None     Restrictions   Weight Bearing Restrictions No     Home Environment   Living Environment Private residence   Living Arrangements Spouse/significant other     Prior Function   Level of Independence Independent   Vocation Full time employment   Multimedia programmerVocation Requirements manager at [employer]   Leisure working out - [employer] and cardio equipment     Cognition   Overall Cognitive Status Within Functional Limits for tasks assessed     Observation/Other Assessments   Focus on Therapeutic Outcomes (FOTO)  42% limitation     Posture/Postural Control   Posture/Postural Control No significant limitations     AROM   Overall AROM  Within functional limits for tasks performed     Strength   Right Shoulder Flexion 5/5   Right Shoulder Extension 5/5   Right Shoulder ABduction 5/5   Right Shoulder Internal Rotation 5/5   Right Shoulder External Rotation 5/5   Left Shoulder Flexion 3+/5   Left Shoulder Extension 4/5   Left Shoulder ABduction 3+/5   Left Shoulder Internal Rotation 4/5   Left Shoulder External Rotation 3+/5   Right Hand Grip (lbs) 60   Left Hand Grip (lbs) 30  Cervical Flexion 5/5   Cervical Extension 5/5  increased pain   Cervical - Right Side Bend 5/5   Cervical - Left Side Bend 5/5  increased pain   Cervical - Right Rotation 5/5   Cervical - Left Rotation 5/5     Palpation   Palpation comment tender to palpation to Lt side: entire circumference of humeral head, scalenes, upper trap, rhomboids, mid trap, cervical paraspinals     Special Tests    Special Tests Cervical   Cervical Tests Spurling's;Dictraction     Spurling's   Findings Positive   Side Left     Distraction Test   Findngs Positive   side Left   Comment --  increased pain     Ambulation/Gait   Gait Pattern Within Functional Limits                             PT Short Term Goals - 07/28/16  4098      PT SHORT TERM GOAL #1   Title independent with initial HEP   Time 4   Period Weeks   Status New     PT SHORT TERM GOAL #2   Title MMT 4+/5 flexion and abduction to demonstrate increased strength for lifting   Time 4   Period Weeks   Status New     PT SHORT TERM GOAL #3   Title pain decreased by 30% for improved function at work   Time 4   Period Weeks   Status New           PT Long Term Goals - 07/28/16 0735      PT LONG TERM GOAL #1   Title independnet with advanced HEP and able to return to exercising at the gym   Time 8   Period Weeks   Status New     PT LONG TERM GOAL #2   Title FOTO < or  = to 27% limitation   Time 8   Period Weeks   Status New     PT LONG TERM GOAL #3   Title pain reduced by 70% or more for improved function at work and return to activities   Time 8   Period Weeks   Status New     PT LONG TERM GOAL #4   Title grip strength improved to 40lb on Lt hand to demonstrate ability to 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  States she has slipped at work and is "clumsy", but does not recall falling and hurting her shoulder or neck.  Pt is currently not working out and the pain and weakness are effecting her ability to perform her job duties.  Pt has very tender muslce insertion points around the humeral head, upper trap, scalenes.  She has trigger point in the left rhomboids were she feels that is where the pain is coming from.  Pt demonstrates shoulder AROM WNL, but does have some scapular dyskinesia on the left with increased upper trap activity.  She demonstrates  weakness Lt grip is 50% of Rt, and  sholder 3+/5 throughout Lt side.  Pt had neurological assessment for nerve conductivity which found no impairments.  Pt is also getting decreased blood flow to Lt hand and her hand is cold and clammy.  Pressure on scalenes increases her symptoms down the arm.  Pt presents with multiple muskuloskeletal impairments that should be addressed with skilled PT in order to return to maximum function and prior activity level.   Rehab Potential Excellent   Clinical Impairments Affecting Rehab Potential none   PT Frequency 2x / week   PT Duration 8 weeks   PT Treatment/Interventions Biofeedback;Cryotherapy;Electrical Stimulation;Iontophoresis 4mg /ml Dexamethasone;Moist Heat;Therapeutic activities;Therapeutic exercise;Neuromuscular re-education;Patient/family education;Manual techniques;Dry needling;Taping   PT Next Visit Plan check cervical AROM, neck and pec stretches, taping, gentle RTC and scapular strengthening, f/u with patient about MRI results   Recommended Other Services none   Consulted and Agree with Plan of Care Patient      Patient will benefit from skilled therapeutic intervention in order to improve the following deficits and impairments:  Decreased activity tolerance, Decreased coordination, Decreased endurance, Pain, Impaired UE functional use, Decreased strength  Visit Diagnosis: Muscle weakness (generalized)  Acute pain of left shoulder  Cervicalgia     Problem List Patient Active Problem List   Diagnosis Date Noted  . Raynaud's phenomenon 07/20/2016    Vincente Poli, PT 07/28/2016, 8:25 AM  [facility] [address], [address] [place], [place], [address] Phone: [phone]   Fax:  [phone]  Name: Michelle Villa MRN: [id number] Date of Birth: Sep 28, 1985

## 2016-07-29 ENCOUNTER — Other Ambulatory Visit: Payer: BLUE CROSS/BLUE SHIELD

## 2016-08-02 ENCOUNTER — Telehealth: Payer: Self-pay | Admitting: Neurology

## 2016-08-02 NOTE — Telephone Encounter (Signed)
Michelle Villa, Dr. Zannie CoveYan's emg/ncs was normal. I'm sure she told patient but I would call and let her know just in case. She has an MRI cervical spine scheduled and if that is negative I may send her for vascular studies to make sure she does not have any vessel disorder that can be causing the pain and coldness in the hand. We can wait until we see the results of the MRI cervical spine or I could order  It now anyway it is up to patient. Please discuss and let me know thanks!

## 2016-08-03 ENCOUNTER — Encounter: Payer: Self-pay | Admitting: Physical Therapy

## 2016-08-03 ENCOUNTER — Ambulatory Visit: Payer: BLUE CROSS/BLUE SHIELD | Admitting: Physical Therapy

## 2016-08-03 DIAGNOSIS — M25512 Pain in left shoulder: Secondary | ICD-10-CM

## 2016-08-03 DIAGNOSIS — M6281 Muscle weakness (generalized): Secondary | ICD-10-CM | POA: Diagnosis not present

## 2016-08-03 DIAGNOSIS — M542 Cervicalgia: Secondary | ICD-10-CM

## 2016-08-03 NOTE — Telephone Encounter (Signed)
Called and spoke to patient. Relayed Dr Trevor MaceAhern's message below. Pt is agreeable to have her refer her for vascular study. She would like her to place order now.   She is scheduled for MRI this Wed. Advised she will hear back about results within the next week after study. She verbalized understanding.

## 2016-08-03 NOTE — Therapy (Signed)
[facility] [address], [address] [place], [place], [address] Phone: [phone]   Fax:  [phone]  Physical Therapy Treatment  Patient Details  Name: Michelle Villa MRN: [id number] Date of Birth: Mar 02, 1986 Referring Provider: Anson FretAntonia B Ahern, MD  Encounter Date: 08/03/2016      PT End of Session - 08/03/16 1140    Visit Number 2   Date for PT Re-Evaluation 09/22/16   PT Start Time 1105   PT Stop Time 1140   PT Time Calculation (min) 35 min   Activity Tolerance Patient tolerated treatment well   Behavior During Therapy [facility] for tasks assessed/performed      Past Medical History:  Diagnosis Date  . Raynaud phenomenon     Past Surgical History:  Procedure Laterality Date  . WISDOM TOOTH EXTRACTION      There were no vitals filed for this visit.      Subjective Assessment - 08/03/16 1112    Subjective Pt reports pain in Lt shoulder under shoulder blade. No tingleing at the moment but increases with use.    Limitations Lifting;Other (comment)   Currently in Pain? Yes   Pain Score 7    Pain Location Shoulder   Pain Orientation Left   Pain Descriptors / Indicators Aching;Sharp;Radiating;Shooting   Pain Type Acute pain   Pain Radiating Towards Down arm and into first three fingers   Pain Onset More than a month ago   Pain Frequency Constant   Aggravating Factors  lifting or grabbing   Pain Relieving Factors resting or not using arm   Effect of Pain on Daily Activities can't do normal activities   Multiple Pain Sites No                         [facility] Adult PT Treatment/Exercise - 08/03/16 0001      Exercises   Exercises Shoulder     Shoulder Exercises: Standing   Row Strengthening;10 reps;Theraband   Theraband Level (Shoulder Row) Level 2 (Red)   Other Standing Exercises 6 way isometrics     Manual Therapy   Manual Therapy Soft tissue mobilization;Taping   Manual therapy comments  Tape to Lt shoulder to facilitate lower trap and assist uppertrap   Soft tissue mobilization Pt supine  Lt upper trap and surrounding scapular muscles                  PT Short Term Goals - 07/28/16 0733      PT SHORT TERM GOAL #1   Title independent with initial HEP   Time 4   Period Weeks   Status New     PT SHORT TERM GOAL #2   Title MMT 4+/5 flexion and abduction to demonstrate increased strength for lifting   Time 4   Period Weeks   Status New     PT SHORT TERM GOAL #3   Title pain decreased by 30% for improved function at work   Time 4   Period Weeks   Status New           PT Long Term Goals - 07/28/16 0735      PT LONG TERM GOAL #1   Title independnet with advanced HEP and able to return to exercising at the gym   Time 8   Period Weeks   Status New     PT LONG TERM GOAL #2   Title FOTO < or  = to 27%  limitation   Time 8   Period Weeks   Status New     PT LONG TERM GOAL #3   Title pain reduced by 70% or more for improved function at work and return to activities   Time 8   Period Weeks   Status New     PT LONG TERM GOAL #4   Title grip strength improved to 40lb on Lt hand to demonstrate 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 No tingling at the moment. Pt has weakness and pain in middle and lower traps, tightness in Lt levator scapula, weakness in all scaupular muscles. Pt had some discumfort under Lt shoulder blade with row. Able to tolerate isometrics well with some muscle fatigue. Pt will continue to benefit from skilled therapy for shoulder strengthening.    Rehab Potential Excellent   Clinical Impairments Affecting Rehab Potential none   PT Frequency 2x / week   PT Duration 8 weeks   PT Treatment/Interventions Biofeedback;Cryotherapy;Electrical  Stimulation;Iontophoresis 4mg /ml Dexamethasone;Moist Heat;Therapeutic activities;Therapeutic exercise;Neuromuscular re-education;Patient/family education;Manual techniques;Dry needling;Taping   PT Next Visit Plan Cervical ROM, gentle shoulder strenghtening and stretching, asses responce to tape, try other modalities   Consulted and Agree with Plan of Care Patient      Patient will benefit from skilled therapeutic intervention in order to improve the following deficits and impairments:  Decreased activity tolerance, Decreased coordination, Decreased endurance, Pain, Impaired UE functional use, Decreased strength  Visit Diagnosis: Muscle weakness (generalized)  Acute pain of left shoulder  Cervicalgia     Problem List Patient Active Problem List   Diagnosis Date Noted  . Raynaud's phenomenon 07/20/2016    [name] PTA 08/03/2016, 1:09 PM  [facility] [address] [place], [place], [address] Phone: [phone]   Fax:  [phone]  Name: Michelle Villa MRN: [id number] Date of Birth: Jan 16, 1986

## 2016-08-05 ENCOUNTER — Other Ambulatory Visit: Payer: Self-pay | Admitting: Neurology

## 2016-08-05 ENCOUNTER — Ambulatory Visit: Payer: BLUE CROSS/BLUE SHIELD | Admitting: Physical Therapy

## 2016-08-05 ENCOUNTER — Encounter: Payer: Self-pay | Admitting: Physical Therapy

## 2016-08-05 ENCOUNTER — Ambulatory Visit (INDEPENDENT_AMBULATORY_CARE_PROVIDER_SITE_OTHER): Payer: BLUE CROSS/BLUE SHIELD

## 2016-08-05 DIAGNOSIS — M6281 Muscle weakness (generalized): Secondary | ICD-10-CM

## 2016-08-05 DIAGNOSIS — R202 Paresthesia of skin: Secondary | ICD-10-CM

## 2016-08-05 DIAGNOSIS — R29898 Other symptoms and signs involving the musculoskeletal system: Secondary | ICD-10-CM | POA: Diagnosis not present

## 2016-08-05 DIAGNOSIS — M25512 Pain in left shoulder: Secondary | ICD-10-CM | POA: Diagnosis not present

## 2016-08-05 DIAGNOSIS — M542 Cervicalgia: Secondary | ICD-10-CM | POA: Diagnosis not present

## 2016-08-05 DIAGNOSIS — M5010 Cervical disc disorder with radiculopathy, unspecified cervical region: Secondary | ICD-10-CM

## 2016-08-05 DIAGNOSIS — I70209 Unspecified atherosclerosis of native arteries of extremities, unspecified extremity: Secondary | ICD-10-CM

## 2016-08-05 DIAGNOSIS — R209 Unspecified disturbances of skin sensation: Secondary | ICD-10-CM

## 2016-08-05 DIAGNOSIS — M79602 Pain in left arm: Secondary | ICD-10-CM

## 2016-08-05 NOTE — Telephone Encounter (Signed)
I ordered this study. The order made me select a site there was no "external" option so [facility] not sure if  imaging does this study. Irving BurtonEmily, I don't order this study often so unsure how to get it done let me know thanks

## 2016-08-05 NOTE — Patient Instructions (Signed)
Strengthening: Isometric Internal Rotation    Using door frame for resistance, press palm of right hand into ball using light pressure. Keep elbow in at side. Hold __5__ seconds. Repeat __10__ times per set. Do __1__ sets per session. Do __1__ sessions per day.  http://orth.exer.us/817   Copyright  VHI. All rights reserved.  Strengthening: Isometric Abduction    Using wall for resistance, press left arm into ball using light pressure. Hold __5__ seconds. Repeat _10___ times per set. Do ___1_ sets per session. Do __1__ sessions per day.  http://orth.exer.us/807   Copyright  VHI. All rights reserved.  Flexion (Isometric)    Press right fist against wall. Hold __5__ seconds. Repeat __10__ times. Do __1__ sessions per day.  http://gt2.exer.us/114   Copyright  VHI. All rights reserved.  External Rotation (Isometric)    Place back of left fist against door frame, with elbow bent. Press fist against door frame. Hold __5__ seconds. Repeat __10__ times. Do __1__ sessions per day.  http://gt2.exer.us/110   Copyright  VHI. All rights reserved.  Extension (Isometric)    Place left bent elbow and back of arm against wall. Press elbow against wall. Hold __5__ seconds. Repeat __10__ times. Do __1__ sessions per day.  http://gt2.exer.us/112   Copyright  VHI. All rights reserved.

## 2016-08-05 NOTE — Telephone Encounter (Signed)
Noted, thank you

## 2016-08-05 NOTE — Therapy (Signed)
[facility] [address] [place], [place], [address] Phone: [phone]   Fax:  [phone]  Physical Therapy Treatment  Patient Details  Name: Michelle Villa MRN: [id number] Date of Birth: 24-Dec-1985 Referring Provider: Anson Fret, MD  Encounter Date: 08/05/2016      PT End of Session - 08/05/16 1156    Visit Number 3   Date for PT Re-Evaluation 09/22/16   PT Start Time 1111   PT Stop Time 1154   PT Time Calculation (min) 43 min   Activity Tolerance Patient tolerated treatment well   Behavior During Therapy [facility] for tasks assessed/performed      Past Medical History:  Diagnosis Date  . Raynaud phenomenon     Past Surgical History:  Procedure Laterality Date  . WISDOM TOOTH EXTRACTION      There were no vitals filed for this visit.      Subjective Assessment - 08/05/16 1112    Subjective Pt states still having pain but was a little better after a full day of activities.  States at the end of the day she is still in more pain and she is trying to do as little as possible.   Limitations Lifting   Currently in Pain? Yes   Pain Score 7    Pain Location Shoulder   Pain Orientation Left   Pain Descriptors / Indicators Aching;Sharp;Radiating;Shooting   Pain Type Acute pain   Pain Onset More than a month ago   Pain Frequency Constant   Aggravating Factors  lifting, working   Pain Relieving Factors resting   Effect of Pain on Daily Activities can't do normal activities   Multiple Pain Sites No                         [facility] Adult PT Treatment/Exercise - 08/05/16 0001      Shoulder Exercises: Standing   External Rotation Strengthening;Left;15 reps;Theraband   Theraband Level (Shoulder External Rotation) Level 1 (Yellow)   Internal Rotation Strengthening;Left;Theraband;10 reps   Theraband Level (Shoulder Internal Rotation) Level 1 (Yellow)   Extension Strengthening;Both;15  reps;Theraband   Theraband Level (Shoulder Extension) Level 1 (Yellow)   Row Strengthening;Theraband;20 reps   Theraband Level (Shoulder Row) Level 1 (Yellow)     Shoulder Exercises: Isometric Strengthening   Flexion 3X5"   Extension 5X5"   External Rotation 5X5"   Internal Rotation 5X5"   ABduction 5X5"     Manual Therapy   Manual Therapy Soft tissue mobilization   Soft tissue mobilization Pt supine; Lt side - subclavius, scalenes, anterior delt, pecs                PT Education - 08/05/16 1155    Education provided Yes   Person(s) Educated Patient   Methods Explanation;Demonstration;Tactile cues;Verbal cues;Handout   Comprehension Verbalized understanding;Returned demonstration          PT Short Term Goals - 08/05/16 1206      PT SHORT TERM GOAL #1   Title independent with initial HEP   Time 4   Period Weeks   Status On-going     PT SHORT TERM GOAL #2   Title MMT 4+/5 flexion and abduction to demonstrate increased strength for lifting   Time 4   Period Weeks   Status On-going     PT SHORT TERM GOAL #3   Title pain decreased by 30% for improved function at work   Time 4  Period Weeks   Status On-going           PT Long Term Goals - 07/28/16 0735      PT LONG TERM GOAL #1   Title independnet with advanced HEP and able to return to exercising at the gym   Time 8   Period Weeks   Status New     PT LONG TERM GOAL #2   Title FOTO < or  = to 27% limitation   Time 8   Period Weeks   Status New     PT LONG TERM GOAL #3   Title pain reduced by 70% or more for improved function at work and return to activities   Time 8   Period Weeks   Status New     PT LONG TERM GOAL #4   Title grip strength improved to 40lb on Lt hand to demonstrate 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 Pt tolerated more pressure during soft tissue release and appeared less anxious today.  Pt had MRI and will find out results next week.  Pt will benefit from skilled PT to continue to strengthen and improve UE function.   Rehab Potential Excellent   Clinical Impairments Affecting Rehab Potential none   PT Frequency 2x / week   PT Duration 8 weeks   PT Treatment/Interventions Biofeedback;Cryotherapy;Electrical Stimulation;Iontophoresis 4mg /ml Dexamethasone;Moist Heat;Therapeutic activities;Therapeutic exercise;Neuromuscular re-education;Patient/family education;Manual techniques;Dry needling;Taping   PT Next Visit Plan continue strenngthening, manual and taping if needed   PT Home Exercise Plan progress as needed - currently doing isometric exercises   Consulted and Agree with Plan of Care Patient      Patient will benefit from skilled therapeutic intervention in order to improve the following deficits and impairments:  Decreased activity tolerance, Decreased coordination, Decreased endurance, Pain, Impaired UE functional use, Decreased strength  Visit Diagnosis: Muscle weakness (generalized)  Acute pain of left shoulder  Cervicalgia     Problem List Patient Active Problem List   Diagnosis Date Noted  . Raynaud's phenomenon 07/20/2016    Vincente PoliJakki Crosser, PT 08/05/2016, 12:09 PM   [facility] [address] [place], [place], [address] Phone: [phone]   Fax:  [phone]  Name: Michelle Villa MRN: [id number] Date of Birth: 1986-01-16

## 2016-08-05 NOTE — Telephone Encounter (Signed)
US Upper Ext Art Left   Order has been sent  [facility] preformed [facility] telephone [phone] - fax [phone].  Patient has telephone number as well spoke to her at check out.

## 2016-08-10 ENCOUNTER — Ambulatory Visit: Payer: BLUE CROSS/BLUE SHIELD | Admitting: Physical Therapy

## 2016-08-10 ENCOUNTER — Encounter: Payer: Self-pay | Admitting: Physical Therapy

## 2016-08-10 ENCOUNTER — Telehealth: Payer: Self-pay | Admitting: Neurology

## 2016-08-10 DIAGNOSIS — M6281 Muscle weakness (generalized): Secondary | ICD-10-CM | POA: Diagnosis not present

## 2016-08-10 DIAGNOSIS — M25512 Pain in left shoulder: Secondary | ICD-10-CM

## 2016-08-10 DIAGNOSIS — M542 Cervicalgia: Secondary | ICD-10-CM | POA: Diagnosis not present

## 2016-08-10 NOTE — Therapy (Signed)
[facility] [address], [address] [place], [place], [address] Phone: [phone]   Fax:  [phone]  Physical Therapy Treatment  Patient Details  Name: Michelle Villa MRN: [id number] Date of Birth: 1985-11-30 Referring Provider: Anson FretAntonia B Ahern, MD  Encounter Date: 08/10/2016      PT End of Session - 08/10/16 1201    Visit Number 4   Date for PT Re-Evaluation 09/22/16   PT Start Time 1107   PT Stop Time 1152   PT Time Calculation (min) 45 min   Activity Tolerance Patient tolerated treatment well   Behavior During Therapy [facility] for tasks assessed/performed      Past Medical History:  Diagnosis Date  . Raynaud phenomenon     Past Surgical History:  Procedure Laterality Date  . WISDOM TOOTH EXTRACTION      There were no vitals filed for this visit.      Subjective Assessment - 08/10/16 1109    Subjective Pt reports symptoms have been better, hand still gets tingly but not constant and getting better.    Currently in Pain? Yes   Pain Score 5    Pain Location Shoulder   Pain Orientation Left   Pain Descriptors / Indicators Aching;Sharp;Radiating;Shooting   Pain Type Acute pain   Pain Radiating Towards Down arm and into first three fingers   Pain Onset More than a month ago   Pain Frequency Constant   Aggravating Factors  lifting, working   Pain Relieving Factors resting   Multiple Pain Sites No                         [facility] Adult PT Treatment/Exercise - 08/10/16 0001      Shoulder Exercises: Prone   Extension Strengthening;Left;20 reps  #2   Horizontal ABduction 1 Strengthening;Both;20 reps  #0     Shoulder Exercises: Standing   Protraction Strengthening;Both;20 reps  Serratus wall slides   Flexion Strengthening;Left;20 reps  #1   ABduction Strengthening;Left;20 reps  #1   Other Standing Exercises Shoulder shrugs  Lt #5 x20     Shoulder Exercises: Power Tower   Extension 20  reps  #20   Row 20 reps  #20   Other Power Tower Exercises Lat bar  #20     Manual Therapy   Manual Therapy Soft tissue mobilization   Soft tissue mobilization Prone upper trap, scalenes, levator scap                PT Education - 08/10/16 1158    Education provided Yes   Education Details Shoulder strengthening   Person(s) Educated Patient   Methods Explanation;Demonstration;Handout   Comprehension Verbalized understanding          PT Short Term Goals - 08/05/16 1206      PT SHORT TERM GOAL #1   Title independent with initial HEP   Time 4   Period Weeks   Status On-going     PT SHORT TERM GOAL #2   Title MMT 4+/5 flexion and abduction to demonstrate increased strength for lifting   Time 4   Period Weeks   Status On-going     PT SHORT TERM GOAL #3   Title pain decreased by 30% for improved function at work   Time 4   Period Weeks   Status On-going           PT Long Term Goals - 07/28/16 [phone]  PT LONG TERM GOAL #1   Title independnet with advanced HEP and able to return to exercising at the gym   Time 8   Period Weeks   Status New     PT LONG TERM GOAL #2   Title FOTO < or  = to 27% limitation   Time 8   Period Weeks   Status New     PT LONG TERM GOAL #3   Title pain reduced by 70% or more for improved function at work and return to activities   Time 8   Period Weeks   Status New     PT LONG TERM GOAL #4   Title grip strength improved to 40lb on Lt hand to demonstrate 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 Pt able to tolerate all exercises well with no increase in pain or tingling but muscle fatigue. Pt appears to have decreased upper trap activation in Lt shoulder.  Decreased deltoid strength and  scapular strength. Pt will continue to benefit from skilled therapy for shoulder strengthening and stability.     Rehab Potential Excellent   Clinical Impairments Affecting Rehab Potential none   PT Frequency 2x / week   PT Duration 8 weeks   PT Treatment/Interventions Biofeedback;Cryotherapy;Electrical Stimulation;Iontophoresis 4mg /ml Dexamethasone;Moist Heat;Therapeutic activities;Therapeutic exercise;Neuromuscular re-education;Patient/family education;Manual techniques;Dry needling;Taping   PT Next Visit Plan progress shoulder strength in standing and prone, upper trap activation, levator scap release   Consulted and Agree with Plan of Care Patient      Patient will benefit from skilled therapeutic intervention in order to improve the following deficits and impairments:  Decreased activity tolerance, Decreased coordination, Decreased endurance, Pain, Impaired UE functional use, Decreased strength  Visit Diagnosis: Muscle weakness (generalized)  Acute pain of left shoulder  Cervicalgia     Problem List Patient Active Problem List   Diagnosis Date Noted  . Raynaud's phenomenon 07/20/2016    [name] PTA 08/10/2016, 12:08 PM  [facility] [address], [address] [place], [place], [address] Phone: [phone]   Fax:  [phone]  Name: Michelle Villa MRN: [id number] Date of Birth: 1986-03-26

## 2016-08-10 NOTE — Patient Instructions (Addendum)
Abduction: Horizontal - Prone (Dumbbell)    Lie with right arm hanging down. Lift arm out to side, thumb up. Repeat __10__ times per set. Do _2___ sets per session. Do __3__ sessions per week. Use __1 or 2__ lb weight.   Copyright  VHI. All rights reserved.  Extension - Prone (Dumbbell)    Lie with right arm hanging off side of bed. Lift hand back and up. Repeat __10__ times per set. Do __2__ sets per session. Do _3___ sessions per week. Use __1 or 2__ lb weight.   Copyright  VHI. All rights reserved.  ABDUCTION: Standing - Stable: Exercise Band (Active)    Stand, right arm down. Against yellow resistance band, lift arm out to side and up as high as possible, keeping elbow straight. Complete _2__ sets of __10_ repetitions. Perform _3__ sessions per week.  Copyright  VHI. All rights reserved.  Dessa PhiKatherine Wanda Rideout, PTA 08/10/16 11:54 AM  [facility] [address], [address] [place], [place] [address] Phone # [phone] Fax [phone]

## 2016-08-10 NOTE — Telephone Encounter (Signed)
Called and left a message for patient that I will call back to discuss results below. Given the fatty marrow changes need to follow up with some labs including checking for osteoporosis, anemia, other blood disorders with an ife/spep. Also she needs to follow up with pcp for the goiter seen.   IMPRESSION:  Abnormal MRI cervical spine (without) demonstrating: 1. Heterogenous mass within the thyroid gland may represent multi-nodular goiter. 2. Non-specific, heterogenous, T1 and T2 hyperintense marrow signal changes, STIR isointense, scattered throughout the vertebral bodies from C2 to T2. Considerations include fatty marrow changes vs hemangiomas. 3. No spinal stenosis or foraminal stenosis.

## 2016-08-11 NOTE — Telephone Encounter (Signed)
Tried calling back, answering machine again left message thanks

## 2016-08-12 ENCOUNTER — Encounter: Payer: Self-pay | Admitting: Physical Therapy

## 2016-08-12 ENCOUNTER — Ambulatory Visit: Payer: BLUE CROSS/BLUE SHIELD | Admitting: Physical Therapy

## 2016-08-12 DIAGNOSIS — M25512 Pain in left shoulder: Secondary | ICD-10-CM

## 2016-08-12 DIAGNOSIS — M6281 Muscle weakness (generalized): Secondary | ICD-10-CM | POA: Diagnosis not present

## 2016-08-12 DIAGNOSIS — M542 Cervicalgia: Secondary | ICD-10-CM | POA: Diagnosis not present

## 2016-08-12 NOTE — Patient Instructions (Signed)

## 2016-08-12 NOTE — Telephone Encounter (Signed)
Michelle Villa, I called again but didn;t reach her I think possibly she works in the evenings. Please remind me to try and call her mid morning tomorrow and maybe I will catch her then thanls

## 2016-08-12 NOTE — Therapy (Signed)
[facility] [address] [place], [place], [address] Phone: [phone]   Fax:  [phone]  Physical Therapy Treatment  Patient Details  Name: Michelle Villa MRN: [id number] Date of Birth: [birth year] Referring Provider: Anson Fret, MD  Encounter Date: 08/12/2016      PT End of Session - 08/12/16 1106    Visit Number 5   Date for PT Re-Evaluation 09/22/16   PT Start Time 1106   PT Stop Time 1146   PT Time Calculation (min) 40 min   Activity Tolerance Patient tolerated treatment well   Behavior During Therapy [facility] for tasks assessed/performed      Past Medical History:  Diagnosis Date  . Raynaud phenomenon     Past Surgical History:  Procedure Laterality Date  . WISDOM TOOTH EXTRACTION      There were no vitals filed for this visit.      Subjective Assessment - 08/12/16 1107    Subjective Pt reports shoulder feeling sore after strenghtneing but doing well.    Limitations Lifting   Currently in Pain? Yes   Pain Score 6    Pain Location Shoulder   Pain Orientation Left   Pain Descriptors / Indicators Aching;Sharp;Radiating   Pain Type Acute pain   Pain Radiating Towards Down arm and into first three fingers   Pain Onset More than a month ago   Pain Frequency Constant   Aggravating Factors  Lifting, working   Pain Relieving Factors resting                         [facility] Adult PT Treatment/Exercise - 08/12/16 0001      Shoulder Exercises: Prone   Extension Strengthening;Left;20 reps  #2   Horizontal ABduction 1 Strengthening;Both;20 reps  #0     Shoulder Exercises: Standing   Protraction Strengthening;Both;20 reps  Serratus wall slides   Flexion Strengthening;Left;20 reps  #2   ABduction Strengthening;Left;20 reps  #2   Other Standing Exercises Shoulder shrugs  Lt #5 x20     Shoulder Exercises: Power Tower   Extension 20 reps  #20   Row 20 reps  #20   Other  Power [employer] Exercises Lat bar  #20     Modalities   Modalities Electrical Stimulation;Moist Heat;Iontophoresis     Moist Heat Therapy   Number Minutes Moist Heat 15 Minutes   Moist Heat Location Shoulder  Left     Electrical Stimulation   Electrical Stimulation Location Left shoulder upper trap, vertebral boarder scapula   Electrical Stimulation Action IFC   Electrical Stimulation Parameters To tolerance, 15 minutes   Electrical Stimulation Goals Pain;Tone     Iontophoresis   Type of Iontophoresis Dexamethasone  #1   Location Lt shoulder over levator   Dose 1 mL   Time 6 hour patch                PT Education - 08/12/16 1144    Education provided Yes   Education Details ionto   Person(s) Educated Patient   Methods Explanation;Handout   Comprehension Verbalized understanding          PT Short Term Goals - 08/12/16 1108      PT SHORT TERM GOAL #1   Title independent with initial HEP   Time 4   Period Weeks   Status On-going     PT SHORT TERM GOAL #2   Title MMT 4+/5 flexion and abduction to demonstrate  increased strength for lifting   Time 4   Period Weeks   Status On-going     PT SHORT TERM GOAL #3   Title pain decreased by 30% for improved function at work   Time 4   Period Weeks   Status On-going           PT Long Term Goals - 08/12/16 1115      PT LONG TERM GOAL #1   Title independnet with advanced HEP and able to return to exercising at the gym   Time 8   Period Weeks   Status On-going     PT LONG TERM GOAL #2   Title FOTO < or  = to 27% limitation   Time 8   Period Weeks   Status On-going     PT LONG TERM GOAL #3   Title pain reduced by 70% or more for improved function at work and return to activities   Time 8   Period Weeks   Status On-going     PT LONG TERM GOAL #4   Title grip strength improved to 40lb on Lt hand to demonstrate 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 Able to tolerate all exercises well with no increase in pain or symptoms. Discussed with patient possibility of dry needling to decrease tension in shoulder. Pt is open to dry needling but also wanting to try other interventions first. Pt responded well to Estim. Will follow up with results from first ionot patch. Pt will continue to benefit from skilled therapy for shoulder strength and stability.    Rehab Potential Excellent   Clinical Impairments Affecting Rehab Potential none   PT Frequency 2x / week   PT Duration 8 weeks   PT Treatment/Interventions Biofeedback;Cryotherapy;Electrical Stimulation;Iontophoresis 4mg /ml Dexamethasone;Moist Heat;Therapeutic activities;Therapeutic exercise;Neuromuscular re-education;Patient/family education;Manual techniques;Dry needling;Taping   PT Next Visit Plan Ionot #2 ;progress shoulder strength in standing and prone, upper trap activation, levator scap release   Consulted and Agree with Plan of Care Patient      Patient will benefit from skilled therapeutic intervention in order to improve the following deficits and impairments:  Decreased activity tolerance, Decreased coordination, Decreased endurance, Pain, Impaired UE functional use, Decreased strength  Visit Diagnosis: Muscle weakness (generalized)  Acute pain of left shoulder  Cervicalgia     Problem List Patient Active Problem List   Diagnosis Date Noted  . Raynaud's phenomenon 07/20/2016    Dessa PhiKatherine Ryun Velez PTA 08/12/2016, 11:58 AM  [facility] [address] [place], [place], [address] Phone: [phone]   Fax:  [phone]  Name: Michelle Villa MRN: [id number] Date of Birth: Nov 10, 1985

## 2016-08-12 NOTE — Telephone Encounter (Signed)
Patient returning a call regarding MRI results. °

## 2016-08-12 NOTE — Telephone Encounter (Signed)
Dr Lucia GaskinsAhern- patient is returning your call about MRI results

## 2016-08-13 ENCOUNTER — Telehealth: Payer: Self-pay | Admitting: Neurology

## 2016-08-13 ENCOUNTER — Other Ambulatory Visit: Payer: Self-pay | Admitting: Neurology

## 2016-08-13 DIAGNOSIS — D7589 Other specified diseases of blood and blood-forming organs: Secondary | ICD-10-CM

## 2016-08-13 NOTE — Telephone Encounter (Signed)
Routed MRI c-spine to [facility] with the following note:  "Dr. Hyman HopesWebb, I have asked patient to follow up with you on the goiter seen on this imaging. I am ordering some labs(cbc, cmp, ife, iron studies) and a bone density test to try and find a cause for the fatty marrow. FYI thanks. Thank you"

## 2016-08-13 NOTE — Telephone Encounter (Signed)
Spoke to patient. Will forward to Shirlean Mylararol Webb for evaluation of the goiter. Will order labs for the fatty marrow changes and recommended patient follow up with me in 6 months in the office so we can order a repeat MRI to follow and ensure is stable, advised patient's responsibility to follow up with me in 6 months in the office to discuss and order repeat imaging thanks

## 2016-08-13 NOTE — Telephone Encounter (Signed)
Dr Lucia GaskinsAhern- here is phone note so you can document what you spoke with patient about, thank you

## 2016-08-17 ENCOUNTER — Other Ambulatory Visit: Payer: Self-pay | Admitting: Neurology

## 2016-08-17 ENCOUNTER — Telehealth: Payer: Self-pay | Admitting: Physical Therapy

## 2016-08-17 ENCOUNTER — Encounter: Payer: Self-pay | Admitting: Physical Therapy

## 2016-08-17 ENCOUNTER — Telehealth: Payer: Self-pay | Admitting: Neurology

## 2016-08-17 ENCOUNTER — Ambulatory Visit (HOSPITAL_COMMUNITY)
Admission: RE | Admit: 2016-08-17 | Discharge: 2016-08-17 | Disposition: A | Discharge status: Home / Self Care | Payer: BLUE CROSS/BLUE SHIELD | Source: Ambulatory Visit | Attending: Surgery | Admitting: Surgery

## 2016-08-17 ENCOUNTER — Ambulatory Visit: Payer: BLUE CROSS/BLUE SHIELD | Attending: Neurology | Admitting: Physical Therapy

## 2016-08-17 DIAGNOSIS — M542 Cervicalgia: Secondary | ICD-10-CM

## 2016-08-17 DIAGNOSIS — M25512 Pain in left shoulder: Secondary | ICD-10-CM | POA: Insufficient documentation

## 2016-08-17 DIAGNOSIS — R209 Unspecified disturbances of skin sensation: Secondary | ICD-10-CM | POA: Diagnosis not present

## 2016-08-17 DIAGNOSIS — M79602 Pain in left arm: Secondary | ICD-10-CM

## 2016-08-17 DIAGNOSIS — M6281 Muscle weakness (generalized): Secondary | ICD-10-CM | POA: Diagnosis not present

## 2016-08-17 DIAGNOSIS — I70208 Unspecified atherosclerosis of native arteries of extremities, other extremity: Secondary | ICD-10-CM

## 2016-08-17 DIAGNOSIS — I70209 Unspecified atherosclerosis of native arteries of extremities, unspecified extremity: Secondary | ICD-10-CM | POA: Diagnosis not present

## 2016-08-17 NOTE — Telephone Encounter (Signed)
Erica with [facility] is calling to give preliminary findings on a Duplex for the patient.

## 2016-08-17 NOTE — Telephone Encounter (Signed)
Michelle DikeJennifer , I have sent referral  To [facility] . Please get with Dr. Lucia GaskinsAhern on Her results.

## 2016-08-17 NOTE — Therapy (Signed)
[facility] [address], [address] [place], [place], [address] Phone: [phone]   Fax:  [phone]  Physical Therapy Treatment  Patient Details  Name: Michelle MaineLynette Rider-Vega MRN: [id number] Date of Birth: 09-Mar-1986 Referring Provider: Anson FretAntonia B Ahern, MD  Encounter Date: 08/17/2016      PT End of Session - 08/17/16 1108    Visit Number 6   Date for PT Re-Evaluation 09/22/16   PT Start Time 1106   PT Stop Time 1203   PT Time Calculation (min) 57 min   Activity Tolerance Patient tolerated treatment well   Behavior [facility] for tasks assessed/performed      Past Medical History:  Diagnosis Date  . Raynaud phenomenon     Past Surgical History:  Procedure Laterality Date  . WISDOM TOOTH EXTRACTION      There were no vitals filed for this visit.      Subjective Assessment - 08/17/16 1107    Subjective Pt reports shoulder is ok, increased pain over weekend. Had burning and itching with ionot patch. Will discontinue.   Limitations Lifting   Currently in Pain? Yes   Pain Score 6    Pain Location Shoulder   Pain Orientation Left   Pain Descriptors / Indicators Aching;Sharp;Radiating   Pain Type Acute pain   Pain Radiating Towards Down arm and into first three fingers   Pain Onset More than a month ago   Pain Frequency Constant   Aggravating Factors  Lifting, working   Pain Relieving Factors Resting                         [facility] Adult PT Treatment/Exercise - 08/17/16 0001      Shoulder Exercises: Prone   Flexion Strengthening;Left;20 reps  #2   Extension Strengthening;Left;20 reps  #3   Horizontal ABduction 1 Strengthening;Both;20 reps  #2     Shoulder Exercises: Standing   Flexion Strengthening;Left;20 reps  #3   ABduction Strengthening;Left;20 reps  #3   Other Standing Exercises D1 extension/ D2 extnsion     Shoulder Exercises: Power Tower   Extension 20 reps  #20   Row 20  reps  #20   Other Power [employer]ower Exercises Lat bar  #20     Modalities   Modalities Electrical Stimulation;Moist Heat     Moist Heat Therapy   Number Minutes Moist Heat 15 Minutes   Moist Heat Location Shoulder  Left     Electrical Stimulation   Electrical Stimulation Location Left shoulder upper trap, vertebral boarder scapula   Electrical Stimulation Action IFC   Electrical Stimulation Parameters To tolerance, 15 minutes   Electrical Stimulation Goals Tone;Pain     Iontophoresis   Type of Iontophoresis --  Discontinue     Manual Therapy   Manual Therapy Soft tissue mobilization   Soft tissue mobilization Prone upper trap, scalenes, levator scap                  PT Short Term Goals - 08/17/16 1109      PT SHORT TERM GOAL #1   Title independent with initial HEP   Time 4   Period Weeks   Status Achieved     PT SHORT TERM GOAL #2   Title MMT 4+/5 flexion and abduction to demonstrate increased strength for lifting   Time 4   Period Weeks   Status On-going     PT SHORT TERM GOAL #3   Title  pain decreased by 30% for improved function at work   Time 4   Period Weeks   Status On-going           PT Long Term Goals - 08/17/16 1109      PT LONG TERM GOAL #1   Title independnet with advanced HEP and able to return to exercising at the gym   Time 8   Period Weeks   Status On-going     PT LONG TERM GOAL #2   Title FOTO < or  = to 27% limitation   Time 8   Period Weeks   Status On-going               Plan - 08/17/16 1320    Clinical Impression Statement Pt reports having increased soreness and symptoms on Saturday, possibly due to it being the end of her work week. Pt able to tolerate all exercsies well with some muscle fatigue. Had difficulty with D2 extneison exercsies but able to tolerate. Pt had skin sensitivity with ionot patch, will discontinue. Pt responded well to manual massage. Discussed dry needling with patient and patient is  agreeable to try. Pt will continue to benefit from skilled therapy for shoulder strength and stability.    Rehab Potential Excellent   Clinical Impairments Affecting Rehab Potential none   PT Frequency 2x / week   PT Duration 8 weeks   PT Treatment/Interventions Biofeedback;Cryotherapy;Electrical Stimulation;Iontophoresis 4mg /ml Dexamethasone;Moist Heat;Therapeutic activities;Therapeutic exercise;Neuromuscular re-education;Patient/family education;Manual techniques;Dry needling;Taping   PT Next Visit Plan Progress shoulder strength in standing and prone, upper trap activation, levator scap release   Consulted and Agree with Plan of Care Patient      Patient will benefit from skilled therapeutic intervention in order to improve the following deficits and impairments:  Decreased activity tolerance, Decreased coordination, Decreased endurance, Pain, Impaired UE functional use, Decreased strength  Visit Diagnosis: Muscle weakness (generalized)  Acute pain of left shoulder  Cervicalgia     Problem List Patient Active Problem List   Diagnosis Date Noted  . Raynaud's phenomenon 07/20/2016    [name] PTA 08/17/2016, 1:27 PM  [facility] [address] [place], [place], [address] Phone: [phone]   Fax:  [phone]  Name: Lisvet Rasheed MRN: [id number] Date of Birth: Oct 13, 1985

## 2016-08-17 NOTE — Telephone Encounter (Signed)
Michelle Villa called back to report that pt's radial artery is occluded.

## 2016-08-17 NOTE — Telephone Encounter (Signed)
Michelle Villa,  I have a patient with an occluded radial artery in her left arm. Do you know where I would send them? I assume vascular surgery maybe dr todd early? would u call their office and see? And also ask how soon the patient should be seen? Young woman has been ongoing for several months with pain and cold hand prelim US read radial artery occlusion.  Victorino DikeJennifer please let patient know we are sending a referral thanks

## 2016-08-17 NOTE — Telephone Encounter (Signed)
[facility] Left VM mssg to call back.

## 2016-08-17 NOTE — Telephone Encounter (Signed)
PTA called patient to schedule a dry needle session with therapist.  Dessa PhiKatherine Matthews, PTA 08/17/16 5:07 PM

## 2016-08-18 NOTE — Telephone Encounter (Signed)
Michelle HarmanDana did you speak with patient about this? Let me know. If not I can have jennifer call.  There is no formal report yet, would you call over there Victorino DikeJennifer and see if you can let them know it has not been read? thanks

## 2016-08-18 NOTE — Telephone Encounter (Signed)
I sent  Referral on 08/17/2016 [facility] . Victorino DikeJennifer will have to call results. I can only call Normal results.

## 2016-08-18 NOTE — Telephone Encounter (Signed)
Received faxed report from [facility]. Upper extremity duplex eval showed "no color or spectral Doppler flow noted in the left ULNAR artery in its entirety with thrombus visualized." Discussed w/ Dr. Lucia GaskinsAhern. Called to notify pt of results and to let her know that referral has been sent to [facility]. May call back w/ additional questions/concerns.

## 2016-08-18 NOTE — Telephone Encounter (Signed)
Candise BowensJen, let patient know her radial artery was reported occluded on prelim report not sure if the formal report is out yet? But just so she knows that is why she may get a call from vascular thanks

## 2016-08-19 ENCOUNTER — Encounter: Payer: Self-pay | Admitting: Physical Therapy

## 2016-08-19 ENCOUNTER — Ambulatory Visit: Payer: BLUE CROSS/BLUE SHIELD | Admitting: Physical Therapy

## 2016-08-19 DIAGNOSIS — M6281 Muscle weakness (generalized): Secondary | ICD-10-CM

## 2016-08-19 DIAGNOSIS — M542 Cervicalgia: Secondary | ICD-10-CM | POA: Diagnosis not present

## 2016-08-19 DIAGNOSIS — M25512 Pain in left shoulder: Secondary | ICD-10-CM

## 2016-08-19 NOTE — Therapy (Signed)
[facility] [address] [place], [place], [address] Phone: [phone]   Fax:  [phone]  Physical Therapy Treatment  Patient Details  Name: Michelle Villa MRN: [id number] Date of Birth: June 24, 1986 Referring Provider: Anson FretAntonia B Ahern, MD  Encounter Date: 08/19/2016      PT End of Session - 08/19/16 1112    Visit Number 7   Date for PT Re-Evaluation 09/22/16   PT Start Time 1110   PT Stop Time 1206   PT Time Calculation (min) 56 min   Activity Tolerance Patient tolerated treatment well   Behavior During Therapy [facility] for tasks assessed/performed      Past Medical History:  Diagnosis Date  . Raynaud phenomenon     Past Surgical History:  Procedure Laterality Date  . WISDOM TOOTH EXTRACTION      There were no vitals filed for this visit.      Subjective Assessment - 08/19/16 1110    Subjective Pt reports shoulder hurting today, possibly from the rain.   Limitations Lifting   Currently in Pain? Yes   Pain Score 8    Pain Location Shoulder   Pain Orientation Left   Pain Descriptors / Indicators Aching;Sharp;Radiating   Pain Type Acute pain   Pain Onset More than a month ago   Pain Frequency Constant                         [facility] Adult PT Treatment/Exercise - 08/19/16 0001      Shoulder Exercises: Prone   Retraction Strengthening;Both;10 reps     Shoulder Exercises: Standing   Horizontal ABduction Strengthening;Left;20 reps;Theraband   Theraband Level (Shoulder Horizontal ABduction) Level 1 (Yellow)   External Rotation Strengthening;Left;15 reps;Theraband   Theraband Level (Shoulder External Rotation) Level 1 (Yellow)   Other Standing Exercises D1 extension/ D2 extnsion  yellow band     Shoulder Exercises: Power [employer]ower   Other Power Tower Exercises Lat bar  #20     Modalities   Modalities Electrical Stimulation;Moist Heat     Moist Heat Therapy   Number Minutes Moist  Heat 15 Minutes   Moist Heat Location Shoulder  Left     Electrical Stimulation   Electrical Stimulation Location Left shoulder upper trap, vertebral boarder scapula   Electrical Stimulation Action IFC   Electrical Stimulation Parameters 11 ma   Electrical Stimulation Goals Tone;Pain     Manual Therapy   Manual Therapy Soft tissue mobilization;Taping   Manual therapy comments Stability tape to Lt shoulder   Soft tissue mobilization Prone upper trap, scalenes, levator scap                  PT Short Term Goals - 08/17/16 1109      PT SHORT TERM GOAL #1   Title independent with initial HEP   Time 4   Period Weeks   Status Achieved     PT SHORT TERM GOAL #2   Title MMT 4+/5 flexion and abduction to demonstrate increased strength for lifting   Time 4   Period Weeks   Status On-going     PT SHORT TERM GOAL #3   Title pain decreased by 30% for improved function at work   Time 4   Period Weeks   Status On-going           PT Long Term Goals - 08/17/16 1109      PT LONG TERM GOAL #1  Title independnet with advanced HEP and able to return to exercising at the gym   Time 8   Period Weeks   Status On-going     PT LONG TERM GOAL #2   Title FOTO < or  = to 27% limitation   Time 8   Period Weeks   Status On-going               Plan - 08/19/16 1155    Clinical Impression Statement Pt continues to have tightness in neck and dull ache radiating down Lt hand. Able to tolerate all exercises well and is progressing with strengthening. Pt will continue to benefit from skilled therapy for shoudler strength, stability, and mangement of pain symptoms.    Rehab Potential Excellent   Clinical Impairments Affecting Rehab Potential none   PT Frequency 2x / week   PT Duration 8 weeks   PT Treatment/Interventions Biofeedback;Cryotherapy;Electrical Stimulation;Iontophoresis 4mg /ml Dexamethasone;Moist Heat;Therapeutic activities;Therapeutic exercise;Neuromuscular  re-education;Patient/family education;Manual techniques;Dry needling;Taping   PT Next Visit Plan Dry needling Lt shoulder/ neck, shoulder strengthening   Consulted and Agree with Plan of Care Patient      Patient will benefit from skilled therapeutic intervention in order to improve the following deficits and impairments:  Decreased activity tolerance, Decreased coordination, Decreased endurance, Pain, Impaired UE functional use, Decreased strength  Visit Diagnosis: Muscle weakness (generalized)  Acute pain of left shoulder  Cervicalgia     Problem List Patient Active Problem List   Diagnosis Date Noted  . Raynaud's phenomenon 07/20/2016    [name] PTA 08/19/2016, 12:10 PM  [facility] [address], [address] [place], [place], [address] Phone: [phone]   Fax:  [phone]  Name: Michelle Villa MRN: [id number] Date of Birth: [birth year]

## 2016-08-19 NOTE — Telephone Encounter (Signed)
Patient requesting a call back to discuss previous message about referral to [facility]ascular. She has questions.

## 2016-08-19 NOTE — Telephone Encounter (Signed)
Returned pt TC. Said that she did get the results that were called to her yesterday. But, is confused b/c she just went to vascular earlier this week for ultrasound study and now she has another appt scheduled there on 09/09/16. Let her know that the new appt is for evaluation and treatment of the occluded artery that was seen during the duplex study. Verbalized understanding. Asks if there are any recommendations prior to her upcoming appt. Pt is on birth control pills which may increase her chances of developing blood clots.

## 2016-08-20 NOTE — Telephone Encounter (Signed)
Called pt back w/ recommendations of starting OTC blood thinner -  baby aspirin, taking 81 mg per day. Verbalized understanding and appreciation for call.

## 2016-08-20 NOTE — Telephone Encounter (Signed)
I spoke to Michelle Villa her pcp and she recommends a daily baby aspirin but no other changes for now. It is unclear why her artery is occluded and we will not know until she goes to vascular. If her hand acutely changes, worsens, she needs to call vascular or proceed to the emergency room asap otherwise looks like she has an appointment this month. Thanks!

## 2016-08-24 ENCOUNTER — Other Ambulatory Visit (INDEPENDENT_AMBULATORY_CARE_PROVIDER_SITE_OTHER): Payer: Self-pay

## 2016-08-24 ENCOUNTER — Ambulatory Visit: Payer: BLUE CROSS/BLUE SHIELD

## 2016-08-24 DIAGNOSIS — M25512 Pain in left shoulder: Secondary | ICD-10-CM

## 2016-08-24 DIAGNOSIS — Z0289 Encounter for other administrative examinations: Secondary | ICD-10-CM

## 2016-08-24 DIAGNOSIS — D7589 Other specified diseases of blood and blood-forming organs: Secondary | ICD-10-CM | POA: Diagnosis not present

## 2016-08-24 DIAGNOSIS — M542 Cervicalgia: Secondary | ICD-10-CM

## 2016-08-24 DIAGNOSIS — M6281 Muscle weakness (generalized): Secondary | ICD-10-CM | POA: Diagnosis not present

## 2016-08-24 NOTE — Therapy (Addendum)
[facility] [address], [address] [place], [place], [address] Phone: [phone]   Fax:  [phone]  Physical Therapy Treatment  Patient Details  Name: Michelle Villa MRN: [id number] Date of Birth: 05/23/1986 Referring Provider: Anson Fret, MD  Encounter Date: 08/24/2016      PT End of Session - 08/24/16 1228    Visit Number 8   Date for PT Re-Evaluation 09/22/16   PT Start Time 1150  dry needling   PT Stop Time 1240   PT Time Calculation (min) 50 min   Activity Tolerance Patient tolerated treatment well   Behavior During Therapy [facility] for tasks assessed/performed      Past Medical History:  Diagnosis Date  . Raynaud phenomenon     Past Surgical History:  Procedure Laterality Date  . WISDOM TOOTH EXTRACTION      There were no vitals filed for this visit.      Subjective Assessment - 08/24/16 1152    Subjective Wants to try needling.     Currently in Pain? Yes   Pain Score 5    Pain Location Shoulder   Pain Orientation Left   Pain Descriptors / Indicators Sharp;Radiating   Pain Type Acute pain   Pain Onset More than a month ago   Pain Frequency Constant   Aggravating Factors  lifting, work   Pain Relieving Factors rest, not working                         [facility] Adult PT Treatment/Exercise - 08/24/16 0001      Electrical Stimulation   Electrical Stimulation Location Left shoulder upper trap, vertebral boarder scapula   Electrical Stimulation Action IFC   Electrical Stimulation Parameters 15 minutes   Electrical Stimulation Goals Pain     Manual Therapy   Manual Therapy Soft tissue mobilization;Myofascial release   Manual therapy comments elongation and trigger point release to Lt rhomboid, subscapularis and upper trap with pt prone   Soft tissue mobilization PA mobs T4-7 grade 3          Trigger Point Dry Needling - 08/24/16 1157    Consent Given? Yes   Education  Handout Provided Yes  pneumothorax precatustion   Muscles Treated Upper Body Rhomboids;Subscapularis;Upper trapezius   Upper Trapezius Response Twitch reponse elicited;Palpable increased muscle length   Rhomboids Response Twitch response elicited;Palpable increased muscle length   Subscapularis Response Twitch response elicited;Palpable increased muscle length              PT Education - 08/24/16 1152    Education provided Yes   Education Details DN info   Person(s) Educated Patient   Methods Explanation;Handout   Comprehension Verbalized understanding          PT Short Term Goals - 08/24/16 1248      PT SHORT TERM GOAL #3   Title pain decreased by 30% for improved function at work   Time 4   Period Weeks   Status On-going           PT Long Term Goals - 08/24/16 1249      PT LONG TERM GOAL #1   Title independnet with advanced HEP and able to return to exercising at the gym   Time 8   Period Weeks   Status On-going     PT LONG TERM GOAL #3   Title pain reduced by 70% or more for improved function at work and return  to activities   Time 8   Period Weeks   Status On-going               Plan - 08/24/16 1228    Clinical Impression Statement Pt with active trigger points and pain in Lt rhomboids and subscapularis.  Pt with improved tissue mobility after dry needling today.  Pt is tolerating all exercises in the clinic and at home for flexiblity and strength.  Pt will continue to benefit from skilled PT for postural strength, flexibility and manual/modalities for pain.     Rehab Potential Excellent   PT Frequency 2x / week   PT Duration 8 weeks   PT Treatment/Interventions Biofeedback;Cryotherapy;Electrical Stimulation;Iontophoresis 4mg /ml Dexamethasone;Moist Heat;Therapeutic activities;Therapeutic exercise;Neuromuscular re-education;Patient/family education;Manual techniques;Dry needling;Taping   PT Next Visit Plan assess response to dry needling, manual,  strength, flexiblity, modalities.   Consulted and Agree with Plan of Care Patient      Patient will benefit from skilled therapeutic intervention in order to improve the following deficits and impairments:  Decreased activity tolerance, Decreased coordination, Decreased endurance, Pain, Impaired UE functional use, Decreased strength  Visit Diagnosis: Muscle weakness (generalized)  Acute pain of left shoulder  Cervicalgia     Problem List Patient Active Problem List   Diagnosis Date Noted  . Raynaud's phenomenon 07/20/2016     Lorrene ReidKelly Duane Trias, PT 08/24/16 12:50 PM  [facility] [address], [address] [place], [place], [address] Phone: [phone]   Fax:  [phone]  Name: Michelle Villa MRN: [id number] Date of Birth: 16-Feb-1986

## 2016-08-24 NOTE — Patient Instructions (Signed)

## 2016-08-26 ENCOUNTER — Ambulatory Visit: Payer: BLUE CROSS/BLUE SHIELD

## 2016-08-26 DIAGNOSIS — M542 Cervicalgia: Secondary | ICD-10-CM

## 2016-08-26 DIAGNOSIS — M6281 Muscle weakness (generalized): Secondary | ICD-10-CM | POA: Diagnosis not present

## 2016-08-26 DIAGNOSIS — M25512 Pain in left shoulder: Secondary | ICD-10-CM | POA: Diagnosis not present

## 2016-08-26 NOTE — Therapy (Signed)
[facility] [address], [address] [place], [place], [address] Phone: [phone]   Fax:  [phone]  Physical Therapy Treatment  Patient Details  Name: Michelle Villa MRN: [id number] Date of Birth: 1986-06-20 Referring Provider: Anson FretAntonia B Ahern, MD  Encounter Date: 08/26/2016      PT End of Session - 08/26/16 0929    Visit Number 9   Date for PT Re-Evaluation 09/22/16   PT Start Time 0856  pt was late   PT Stop Time 0945   PT Time Calculation (min) 49 min   Activity Tolerance Patient tolerated treatment well   Behavior During Therapy [facility] for tasks assessed/performed      Past Medical History:  Diagnosis Date  . Raynaud phenomenon     Past Surgical History:  Procedure Laterality Date  . WISDOM TOOTH EXTRACTION      There were no vitals filed for this visit.      Subjective Assessment - 08/26/16 0859    Subjective Felt good after dry needling.  Then went to work the next day and aggravated everything again.     Currently in Pain? Yes   Pain Score 8    Pain Location Shoulder   Pain Orientation Left   Pain Descriptors / Indicators Tightness;Sharp;Radiating   Pain Type Acute pain   Pain Onset More than a month ago   Pain Frequency Constant                         [facility] Adult PT Treatment/Exercise - 08/26/16 0001      Shoulder Exercises: Supine   Horizontal ABduction Strengthening;20 reps;Theraband   Theraband Level (Shoulder Horizontal ABduction) Level 1 (Yellow)  on foam roll   External Rotation Strengthening;Both;20 reps   Theraband Level (Shoulder External Rotation) Level 1 (Yellow)  on foam roll   Other Supine Exercises supine on foam roll: decompression x 3 minutes     Modalities   Modalities Electrical Stimulation;Moist Heat;Ultrasound     Moist Heat Therapy   Number Minutes Moist Heat 15 Minutes   Moist Heat Location Cervical;Shoulder     Electrical Stimulation   Electrical Stimulation Location Lt scapular border   Electrical Stimulation Action IFC   Electrical Stimulation Parameters 15 minutes   Electrical Stimulation Goals Pain     Ultrasound   Ultrasound Location Lt rhomboids   Ultrasound Parameters 1.2 w/cm2 cont x 6 minutes     Manual Therapy   Manual Therapy Joint mobilization   Soft tissue mobilization PA mobs T4-7 grade 3                  PT Short Term Goals - 08/24/16 1248      PT SHORT TERM GOAL #3   Title pain decreased by 30% for improved function at work   Time 4   Period Weeks   Status On-going           PT Long Term Goals - 08/24/16 1249      PT LONG TERM GOAL #1   Title independnet with advanced HEP and able to return to exercising at the gym   Time 8   Period Weeks   Status On-going     PT LONG TERM GOAL #3   Title pain reduced by 70% or more for improved function at work and return to activities   Time 8   Period Weeks   Status On-going  Plan - 08/26/16 0904    Clinical Impression Statement Pt reports reduced pain after dry needling last session.  Pt with flare-up of pain at work yesterday.  Pt demonstrates postural weakness with resisted exercise today.  Pt with continued tension over Lt medial scapular border.  Pt will continued to benefit from skilled PT for manual, flexibility and modalities for pain.     Rehab Potential Excellent   PT Frequency 2x / week   PT Duration 8 weeks   PT Treatment/Interventions Biofeedback;Cryotherapy;Electrical Stimulation;Iontophoresis 4mg /ml Dexamethasone;Moist Heat;Therapeutic activities;Therapeutic exercise;Neuromuscular re-education;Patient/family education;Manual techniques;Dry needling;Taping   PT Next Visit Plan  dry needling, manual, strength, flexiblity, modalities.   Consulted and Agree with Plan of Care Patient      Patient will benefit from skilled therapeutic intervention in order to improve the following deficits and  impairments:  Decreased activity tolerance, Decreased coordination, Decreased endurance, Pain, Impaired UE functional use, Decreased strength  Visit Diagnosis: Muscle weakness (generalized)  Acute pain of left shoulder  Cervicalgia     Problem List Patient Active Problem List   Diagnosis Date Noted  . Raynaud's phenomenon 07/20/2016     Michelle Villa, PT 08/26/16 9:32 AM  [facility] [address] [place], [place], [address] Phone: [phone]   Fax:  [phone]  Name: Michelle Villa MRN: [id number] Date of Birth: October 06, 1985

## 2016-08-27 ENCOUNTER — Encounter: Payer: BLUE CROSS/BLUE SHIELD | Admitting: Neurology

## 2016-08-27 LAB — FERRITIN: Ferritin: 236 ng/mL — ABNORMAL HIGH (ref 15–150)

## 2016-08-27 LAB — COMPREHENSIVE METABOLIC PANEL
ALT: 26 IU/L (ref 0–32)
AST: 29 IU/L (ref 0–40)
Albumin/Globulin Ratio: 2 (ref 1.2–2.2)
Albumin: 4.3 g/dL (ref 3.5–5.5)
Alkaline Phosphatase: 46 IU/L (ref 39–117)
BUN/Creatinine Ratio: 11 (ref 9–23)
BUN: 9 mg/dL (ref 6–20)
Bilirubin Total: 0.6 mg/dL (ref 0.0–1.2)
CO2: 22 mmol/L (ref 18–29)
Calcium: 9.3 mg/dL (ref 8.7–10.2)
Chloride: 101 mmol/L (ref 96–106)
Creatinine, Ser: 0.8 mg/dL (ref 0.57–1.00)
GFR calc Af Amer: 116 mL/min/{1.73_m2} (ref >59)
GFR calc non Af Amer: 101 mL/min/{1.73_m2} (ref >59)
Globulin, Total: 2.1 g/dL (ref 1.5–4.5)
Glucose: 95 mg/dL (ref 65–99)
Potassium: 4.5 mmol/L (ref 3.5–5.2)
Sodium: 138 mmol/L (ref 134–144)
Total Protein: 6.4 g/dL (ref 6.0–8.5)

## 2016-08-27 LAB — CBC WITH DIFFERENTIAL/PLATELET
Basophils Absolute: 0 10*3/uL (ref 0.0–0.2)
Basos: 0 %
EOS (ABSOLUTE): 0.3 10*3/uL (ref 0.0–0.4)
Eos: 6 %
Hematocrit: 38.9 % (ref 34.0–46.6)
Hemoglobin: 13.1 g/dL (ref 11.1–15.9)
Immature Grans (Abs): 0 10*3/uL (ref 0.0–0.1)
Immature Granulocytes: 0 %
Lymphocytes Absolute: 2.4 10*3/uL (ref 0.7–3.1)
Lymphs: 42 %
MCH: 33.7 pg — ABNORMAL HIGH (ref 26.6–33.0)
MCHC: 33.7 g/dL (ref 31.5–35.7)
MCV: 100 fL — ABNORMAL HIGH (ref 79–97)
Monocytes Absolute: 0.5 10*3/uL (ref 0.1–0.9)
Monocytes: 10 %
Neutrophils Absolute: 2.3 10*3/uL (ref 1.4–7.0)
Neutrophils: 42 %
Platelets: 230 10*3/uL (ref 150–379)
RBC: 3.89 x10E6/uL (ref 3.77–5.28)
RDW: 12.8 % (ref 12.3–15.4)
WBC: 5.6 10*3/uL (ref 3.4–10.8)

## 2016-08-27 LAB — MULTIPLE MYELOMA PANEL, SERUM
Albumin SerPl Elph-Mcnc: 3.8 g/dL (ref 2.9–4.4)
Albumin/Glob SerPl: 1.5 (ref 0.7–1.7)
Alpha 1: 0.2 g/dL (ref 0.0–0.4)
Alpha2 Glob SerPl Elph-Mcnc: 0.7 g/dL (ref 0.4–1.0)
B-Globulin SerPl Elph-Mcnc: 1 g/dL (ref 0.7–1.3)
Gamma Glob SerPl Elph-Mcnc: 0.7 g/dL (ref 0.4–1.8)
Globulin, Total: 2.6 g/dL (ref 2.2–3.9)
IgA/Immunoglobulin A, Serum: 159 mg/dL (ref 87–352)
IgG (Immunoglobin G), Serum: 689 mg/dL — ABNORMAL LOW (ref 700–1600)
IgM (Immunoglobulin M), Srm: 41 mg/dL (ref 26–217)

## 2016-08-27 LAB — IRON AND TIBC
Iron Saturation: 53 % (ref 15–55)
Iron: 140 ug/dL (ref 27–159)
Total Iron Binding Capacity: 263 ug/dL (ref 250–450)
UIBC: 123 ug/dL — ABNORMAL LOW (ref 131–425)

## 2016-08-31 ENCOUNTER — Telehealth: Payer: Self-pay | Admitting: *Deleted

## 2016-08-31 ENCOUNTER — Ambulatory Visit: Payer: BLUE CROSS/BLUE SHIELD

## 2016-08-31 DIAGNOSIS — M25512 Pain in left shoulder: Secondary | ICD-10-CM | POA: Diagnosis not present

## 2016-08-31 DIAGNOSIS — M542 Cervicalgia: Secondary | ICD-10-CM

## 2016-08-31 DIAGNOSIS — M6281 Muscle weakness (generalized): Secondary | ICD-10-CM | POA: Diagnosis not present

## 2016-08-31 NOTE — Telephone Encounter (Signed)
I'd like her to see hematology if she is willing, they would be more qualified to answer questions. If she is willing I can place referral thanks

## 2016-08-31 NOTE — Therapy (Signed)
[facility] [address], [address] [place], [place], [address] Phone: [phone]   Fax:  [phone]  Physical Therapy Treatment  Patient Details  Name: Lucila MaineLynette Rider-Vega MRN: [id number] Date of Birth: 17-Jun-1986 Referring Provider: Anson FretAntonia B Ahern, MD  Encounter Date: 08/31/2016      PT End of Session - 08/31/16 0926    Visit Number 10   Date for PT Re-Evaluation 09/22/16   PT Start Time 0852  pt late   PT Stop Time 0940   PT Time Calculation (min) 48 min   Activity Tolerance Patient tolerated treatment well   Behavior During Therapy [facility] for tasks assessed/performed      Past Medical History:  Diagnosis Date  . Raynaud phenomenon     Past Surgical History:  Procedure Laterality Date  . WISDOM TOOTH EXTRACTION      There were no vitals filed for this visit.      Subjective Assessment - 08/31/16 0858    Subjective I have to see MD today due to mass on my Thyroid.     Currently in Pain? Yes   Pain Score 8    Pain Location Shoulder   Pain Orientation Left   Pain Descriptors / Indicators Tightness;Sharp;Radiating   Pain Type Acute pain   Pain Onset More than a month ago   Pain Frequency Constant   Aggravating Factors  lifting, work   Pain Relieving Factors rest, not working, dry needling                         [facility] Adult PT Treatment/Exercise - 08/31/16 0001      Shoulder Exercises: ROM/Strengthening   UBE (Upper Arm Bike) Level 1x 6 minutes   PT present to discuss progress     Modalities   Modalities Moist Heat;Electrical Stimulation     Moist Heat Therapy   Number Minutes Moist Heat 15 Minutes   Moist Heat Location Cervical;Shoulder     Electrical Stimulation   Electrical Stimulation Location Lt scapular border   Electrical Stimulation Action IFC   Electrical Stimulation Parameters 15 minutes   Electrical Stimulation Goals Pain     Manual Therapy   Manual Therapy Soft  tissue mobilization;Myofascial release   Manual therapy comments elongation and trigger point release to Lt rhomboid, subscapularis and upper trap with pt prone   Soft tissue mobilization PA mobs T4-7 grade 3          Trigger Point Dry Needling - 08/31/16 0901    Consent Given? Yes   Muscles Treated Upper Body Rhomboids;Subscapularis;Upper trapezius   Upper Trapezius Response Twitch reponse elicited;Palpable increased muscle length   Rhomboids Response Twitch response elicited;Palpable increased muscle length   Subscapularis Response Twitch response elicited;Palpable increased muscle length                PT Short Term Goals - 08/31/16 0859      PT SHORT TERM GOAL #3   Title pain decreased by 30% for improved function at work   Time 4   Period Weeks   Status On-going           PT Long Term Goals - 08/24/16 1249      PT LONG TERM GOAL #1   Title independnet with advanced HEP and able to return to exercising at the gym   Time 8   Period Weeks   Status On-going     PT LONG TERM GOAL #3  Title pain reduced by 70% or more for improved function at work and return to activities   Time 8   Period Weeks   Status On-going               Plan - 08/31/16 0903    Clinical Impression Statement (P)  Pt with reduced pain after dry needling last week.  Pt reports aggravation of symptoms when she works and not enough days off to rest.  Pt with continued trigger points in Lt rhomboids, subscapularis and upper traps and demonstrated improved mobility after dry neelding and manual therapy today.  Pt remains posturally weak. Pt will continue to benefit from skilled PT for manual, flexibility, strength and modalities.     Rehab Potential (P)  Excellent   PT Frequency (P)  2x / week   PT Duration (P)  8 weeks   PT Treatment/Interventions (P)  Biofeedback;Cryotherapy;Electrical Stimulation;Iontophoresis 4mg /ml Dexamethasone;Moist Heat;Therapeutic activities;Therapeutic  exercise;Neuromuscular re-education;Patient/family education;Manual techniques;Dry needling;Taping   PT Next Visit Plan (P)   manual, strength, flexiblity, modalities.   Consulted and Agree with Plan of Care (P)  Patient      Patient will benefit from skilled therapeutic intervention in order to improve the following deficits and impairments:  (P) Decreased activity tolerance, Decreased coordination, Decreased endurance, Pain, Impaired UE functional use, Decreased strength  Visit Diagnosis: Muscle weakness (generalized)  Acute pain of left shoulder  Cervicalgia     Problem List Patient Active Problem List   Diagnosis Date Noted  . Raynaud's phenomenon 07/20/2016     Lorrene Reid, PT 08/31/16 9:28 AM  [facility] [address] [place], [place], [address] Phone: [phone]   Fax:  [phone]  Name: Kanda Deluna MRN: [id number] Date of Birth: Feb 04, 1986

## 2016-08-31 NOTE — Telephone Encounter (Signed)
Spoke with patient and advised her that Dr Lucia GaskinsAhern stated she may continue taking her MVI. Patient inquired if a hematology appointment was necessary. This RN advised that Dr Trevor MaceAhern's concern is related to her blood clot. She recommends a specialist evaluate her for possible causes of increased clotting of her blood. Patient verbalized understanding, agreement to referral.

## 2016-08-31 NOTE — Telephone Encounter (Signed)
Patient called back with two questions: she stated Dr Lucia GaskinsAhern talked to her about osteoporosis, and she wanted to know if she should talk to her PCP about it. Advised she could discuss with PCP or GYN provider. She then stated she is taking a MVI with iron, and she wanted to know if she should stop taking the MVI. Advised her will route to Dr Lucia GaskinsAhern and call her with reply.  She verbalized understanding, appreciation.

## 2016-08-31 NOTE — Telephone Encounter (Signed)
Per Dr Lucia GaskinsAhern, spoke with patient and informed her that her lab results showed that her iron stores are a little elevated which is non specific. The other labs are unremarkable. Dr Lucia GaskinsAhern stated that given her clot in the left arm, Dr Lucia GaskinsAhern would like to send her to hematology to ensure there is nothing that is causing her to be more susceptible to clotting in her blood.  Patient stated that she would see a hematologist if Dr Lucia GaskinsAhern recommended that. Advised her will route to Dr Lucia GaskinsAhern. She verbalized understanding, had no questions.

## 2016-09-02 ENCOUNTER — Ambulatory Visit: Payer: BLUE CROSS/BLUE SHIELD | Admitting: Physical Therapy

## 2016-09-02 ENCOUNTER — Encounter: Payer: Self-pay | Admitting: Vascular Surgery

## 2016-09-02 ENCOUNTER — Encounter: Payer: Self-pay | Admitting: Physical Therapy

## 2016-09-02 DIAGNOSIS — M6281 Muscle weakness (generalized): Secondary | ICD-10-CM | POA: Diagnosis not present

## 2016-09-02 DIAGNOSIS — M25512 Pain in left shoulder: Secondary | ICD-10-CM | POA: Diagnosis not present

## 2016-09-02 DIAGNOSIS — M542 Cervicalgia: Secondary | ICD-10-CM | POA: Diagnosis not present

## 2016-09-02 NOTE — Addendum Note (Signed)
Addended by: Naomie DeanAHERN, ANTONIA B on: 09/02/2016 01:17 PM   Modules accepted: Orders

## 2016-09-02 NOTE — Therapy (Signed)
[facility] [address], [address] [place], [place], [address] Phone: [phone]   Fax:  [phone]  Physical Therapy Treatment  Patient Details  Name: Michelle Villa MRN: [id number] Date of Birth: 1985/07/25 Referring Provider: Anson Fret, MD  Encounter Date: 09/02/2016      PT End of Session - 09/02/16 1101    Visit Number 11   Date for PT Re-Evaluation 09/22/16   PT Start Time 1103   PT Stop Time 1141   PT Time Calculation (min) 38 min   Activity Tolerance Patient tolerated treatment well   Behavior During Therapy [facility] for tasks assessed/performed      Past Medical History:  Diagnosis Date  . Raynaud phenomenon     Past Surgical History:  Procedure Laterality Date  . WISDOM TOOTH EXTRACTION      There were no vitals filed for this visit.      Subjective Assessment - 09/02/16 1105    Subjective Overall doing better and feeling like can do more.  Shoulder pain is way down, but still feeling forearm and feels like it is where they said there is a blood clot.   Limitations Lifting   Currently in Pain? Yes   Pain Score 7    Pain Location Arm   Pain Orientation Left   Pain Descriptors / Indicators Aching   Pain Type Acute pain   Pain Radiating Towards forearm   Pain Onset More than a month ago   Effect of Pain on Daily Activities limited at work   Multiple Pain Sites No                         [facility] Adult PT Treatment/Exercise - 09/02/16 0001      Shoulder Exercises: Standing   Horizontal ABduction Strengthening;Left;20 reps;Theraband   Theraband Level (Shoulder Horizontal ABduction) Level 2 (Red)   External Rotation Strengthening;Left;15 reps;Theraband   Theraband Level (Shoulder External Rotation) Level 2 (Red)   Other Standing Exercises D1 extension/ D2 extnsion - 20x each  yellow band     Shoulder Exercises: ROM/Strengthening   UBE (Upper Arm Bike) Level 1x 6 minutes    PT present to discuss progress   Wall Wash 4 ways with red weighted ball - 20x each way   Wall Pushups 20 reps   Other ROM/Strengthening Exercises wrist extensor stretch - 3 x 30 sec hold 2 ways     Shoulder Exercises: Power Radiation protection practitioner 25 reps  #20   Row 25 reps  #20   External Rotation Limitations 20# 2x 10   Internal Rotation Limitations 15# 2x10   Other Power Tower Exercises Lat bar - 20x with cues for posture and bracing with abdominals  #20                  PT Short Term Goals - 09/02/16 1115      PT SHORT TERM GOAL #1   Title independent with initial HEP   Time 4   Period Weeks   Status Achieved     PT SHORT TERM GOAL #2   Title MMT 4+/5 flexion and abduction to demonstrate increased strength for lifting   Time 4   Period Weeks   Status On-going     PT SHORT TERM GOAL #3   Title pain decreased by 30% for improved function at work   Time 4   Period Weeks   Status Achieved  PT Long Term Goals - 09/02/16 1109      PT LONG TERM GOAL #1   Title independnet with advanced HEP and able to return to exercising at the gym   Baseline tried exercises at the gym and were hurting so hasn't been back to that yet   Time 8   Period Weeks   Status On-going     PT LONG TERM GOAL #2   Title FOTO < or  = to 27% limitation   Baseline 32%   Time 8   Period Weeks   Status On-going     PT LONG TERM GOAL #3   Title pain reduced by 70% or more for improved function at work and return to activities   Baseline 80%   Time 8   Period Weeks   Status Achieved     PT LONG TERM GOAL #4   Title grip strength improved to 40lb on Lt hand to demonstrate 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  She  reports 80% less pain . Continues to have pain in forearm and is being treated medically for blood clot found in her forearm.  Reviewed exercises and able to perform HEP at this time.  Will need skilled PT to ensure she is able to successfully perform HEP safely witout increased pain and able to feel comfortable porgressing on her own.   Rehab Potential Excellent   PT Frequency Biweekly   PT Duration 2 weeks   PT Treatment/Interventions Biofeedback;Cryotherapy;Electrical Stimulation;Iontophoresis 4mg /ml Dexamethasone;Moist Heat;Therapeutic activities;Therapeutic exercise;Neuromuscular re-education;Patient/family education;Manual techniques;Dry needling;Taping   PT Next Visit Plan discharge anticipated next visit in two weeks, review and update HEP as needed to ensure patient is able to progress strengthening on her own   PT Home Exercise Plan current HEP: shoulder IR, ER, ext, row, horizontal abduction, D2 both ways   Consulted and Agree with Plan of Care Patient      Patient will benefit from skilled therapeutic intervention in order to improve the following deficits and impairments:  Decreased activity tolerance, Decreased coordination, Decreased endurance, Pain, Impaired UE functional use, Decreased strength  Visit Diagnosis: Muscle weakness (generalized)  Acute pain of left shoulder  Cervicalgia     Problem List Patient Active Problem List   Diagnosis Date Noted  . Raynaud's phenomenon 07/20/2016    Vincente PoliJakki Crosser, PT 09/02/2016, 12:03 PM  [facility] [address], [address] [place], [place], [address] Phone: [phone]   Fax:  [phone]  Name: Lucila MaineLynette Rider-Vega MRN: [id number] Date of Birth: 16-Oct-1985

## 2016-09-09 ENCOUNTER — Ambulatory Visit (INDEPENDENT_AMBULATORY_CARE_PROVIDER_SITE_OTHER): Payer: BLUE CROSS/BLUE SHIELD | Admitting: Vascular Surgery

## 2016-09-09 ENCOUNTER — Other Ambulatory Visit: Payer: Self-pay

## 2016-09-09 ENCOUNTER — Encounter: Payer: Self-pay | Admitting: Vascular Surgery

## 2016-09-09 VITALS — BP 127/78 | HR 97 | Temp 98.8°F | Resp 16 | Ht 63.0 in | Wt 144.0 lb

## 2016-09-09 DIAGNOSIS — I739 Peripheral vascular disease, unspecified: Secondary | ICD-10-CM | POA: Diagnosis not present

## 2016-09-09 NOTE — Progress Notes (Signed)
Referring Physician: Dr Lucia GaskinsAhern  Patient name: Michelle Villa MRN: [id number] DOB: [birth year] Sex: female  REASON FOR CONSULT: Left hand pain  HPI: Michelle Villa is a 31 y.o. female, with a history of left shoulder and hand pain for about 4 months. The patient denies any trauma. She states that all of the symptoms have improved slightly since November. She is currently getting physical therapy. She was seen by neurology for evaluation of this. She had a negative EMG and cervical spine MRI. 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 The patient denies any family history of early atherosclerosis. She denies any prior history of IV drug abuse. She had been told in the past that she had Raynaud's. However, her symptoms always been unilateral on the left side. Her primary complaint is pain in digits 1, 2 and 3 of the left hand with numbness. She also occasionally has pain extending from the midforearm down to the hand. She was placed on aspirin recently.  Past Medical History:  Diagnosis Date  . Raynaud phenomenon    Past Surgical History:  Procedure Laterality Date  . WISDOM TOOTH EXTRACTION      Family History  Problem Relation Age of Onset  . Hypertension Father   . Hyperlipidemia Father   . Neuropathy Neg Hx     SOCIAL HISTORY: Social History   Social History  . Marital status: Single    Spouse name: N/A  . Number of children: N/A  . Years of education: N/A   Occupational History  . waitress    Social History Main Topics  . Smoking status: Never Smoker  . Smokeless tobacco: Never Used  . Alcohol use 4.2 oz/week    7 Standard drinks or equivalent per week  . Drug use: Yes    Types: Marijuana  . Sexual activity: Not on file   Other Topics Concern  . Not on file   Social History Narrative  . No narrative on file    No Known Allergies  Current Outpatient Prescriptions  Medication Sig  Dispense Refill  . aspirin EC 81 MG tablet Take 81 mg by mouth daily.    . Cyanocobalamin (VITAMIN B-12) 3000 MCG SUBL Place 3,000 mcg under the tongue daily.     . drospirenone-ethinyl estradiol (YASMIN,ZARAH,SYEDA) 3-0.03 MG tablet Take 1 tablet by mouth daily.    . Multiple Vitamins-Minerals (ECHINACEA ACZ PO) Take 1 tablet by mouth daily.     . Multiple Vitamins-Minerals (MULTI COMPLETE PO) Take 1 tablet by mouth daily.     . traMADol (ULTRAM) 50 MG tablet Take 50-100 mg by mouth 2 (two) times daily as needed for moderate pain.     Marland Kitchen. OVER THE COUNTER MEDICATION Take 1 tablet by mouth at bedtime as needed (sleep). Passion Flower otc sleep aid     No current facility-administered medications for this visit.     ROS:   General:  No weight loss, Fever, chills  HEENT: No recent headaches, no nasal bleeding, no visual changes, no sore throat  Neurologic: No dizziness, blackouts, seizures. No recent symptoms of stroke or mini- stroke. No recent episodes of slurred speech, or temporary blindness.  Cardiac: No recent episodes of chest pain/pressure, no shortness of breath at rest.  No shortness of breath with exertion.  Denies history of atrial fibrillation or irregular heartbeat  Vascular: No history of rest pain in feet.  No history of claudication.  No  history of non-healing ulcer, No history of DVT   Pulmonary: No home oxygen, no productive cough, no hemoptysis,  No asthma or wheezing  Musculoskeletal:  [ ]  Arthritis, [ ]  Low back pain,  [ ]  Joint pain  Hematologic:No history of hypercoagulable state.  No history of easy bleeding.  No history of anemia  Gastrointestinal: No hematochezia or melena,  No gastroesophageal reflux, no trouble swallowing  Urinary: [ ]  chronic Kidney disease, [ ]  on HD - [ ]  MWF or [ ]  TTHS, [ ]  Burning with urination, [ ]  Frequent urination, [ ]  Difficulty urinating;   Skin: No rashes  Psychological: No history of anxiety,  No history of  depression   Physical Examination  Vitals:   09/09/16 0928  BP: 127/78  Pulse: 97  Resp: 16  Temp: 98.8 F (37.1 C)  TempSrc: Oral  SpO2: 98%  Weight: 144 lb (65.3 kg)  Height: 5\' 3"  (1.6 m)    Body mass index is 25.51 kg/m.  General:  Alert and oriented, no acute distress HEENT: Normal Neck: No bruit or JVD Pulmonary: Clear to auscultation bilaterally Cardiac: Regular Rate and Rhythm without murmur Abdomen: Soft, non-tender, non-distended, no mass Skin: No rash or ulcer, left hand slightly cooler than the right Extremity Pulses:  2+ radial, brachial, femoral, dorsalis pedis, posterior tibial pulses with the exception of left upper extremity, she has a 1+ left radial pulse absent ulnar pulse  Musculoskeletal: No deformity or edema  Neurologic: Upper and lower extremity motor 5/5 and symmetric  DATA: She had a left upper extremity duplex performed on 08/18/2016. I reviewed this today. This showed patency of her left subclavian axillary brachial and radial arteries. There was triphasic flow. Her ulnar artery was occluded over the entire segment.   ASSESSMENT:  Left hand numbness tingling with decreased temperature known ulnar artery occlusion in a patient with no real known risk factors for this.   PLAN:  Arch aortogram left upper extremity arteriogram. Softener symptoms sound like thoracic outlet syndrome so there is a possibility she could have a area of her artery that embolized to her ulnar artery. She does not have history of atrial fibrillation. Risk benefits possible palpitations and procedure details including not limited to stroke bleeding vessel injury were discussed with the patient today. She understands and agrees to proceed. She will continue her aspirin. We'll consider hematology workup depending on whether or not she has any significant findings on her arteriogram. If she has nothing that is reconstructable consideration may need to be given for possible  sympathectomy. I believe Raynaud's is less likely since she only has unilateral symptoms.   Fabienne Bruns, MD [facility] Office: [phone] Pager: [phone]

## 2016-09-11 ENCOUNTER — Encounter (HOSPITAL_COMMUNITY)
Admission: RE | Disposition: A | Discharge status: Home / Self Care | Payer: Self-pay | Source: Ambulatory Visit | Attending: Vascular Surgery

## 2016-09-11 ENCOUNTER — Encounter (HOSPITAL_COMMUNITY): Payer: Self-pay | Admitting: Vascular Surgery

## 2016-09-11 ENCOUNTER — Telehealth: Payer: Self-pay | Admitting: Vascular Surgery

## 2016-09-11 ENCOUNTER — Ambulatory Visit (HOSPITAL_COMMUNITY)
Admission: RE | Admit: 2016-09-11 | Discharge: 2016-09-11 | Disposition: A | Discharge status: Home / Self Care | Payer: BLUE CROSS/BLUE SHIELD | Source: Ambulatory Visit | Attending: Vascular Surgery | Admitting: Vascular Surgery

## 2016-09-11 ENCOUNTER — Other Ambulatory Visit: Payer: Self-pay | Admitting: *Deleted

## 2016-09-11 DIAGNOSIS — F1290 Cannabis use, unspecified, uncomplicated: Secondary | ICD-10-CM | POA: Diagnosis not present

## 2016-09-11 DIAGNOSIS — Z7982 Long term (current) use of aspirin: Secondary | ICD-10-CM | POA: Insufficient documentation

## 2016-09-11 DIAGNOSIS — M79642 Pain in left hand: Secondary | ICD-10-CM | POA: Diagnosis not present

## 2016-09-11 DIAGNOSIS — G540 Brachial plexus disorders: Secondary | ICD-10-CM

## 2016-09-11 DIAGNOSIS — I739 Peripheral vascular disease, unspecified: Secondary | ICD-10-CM | POA: Diagnosis not present

## 2016-09-11 DIAGNOSIS — Z8249 Family history of ischemic heart disease and other diseases of the circulatory system: Secondary | ICD-10-CM | POA: Diagnosis not present

## 2016-09-11 DIAGNOSIS — I70208 Unspecified atherosclerosis of native arteries of extremities, other extremity: Secondary | ICD-10-CM | POA: Diagnosis not present

## 2016-09-11 DIAGNOSIS — Z01810 Encounter for preprocedural cardiovascular examination: Secondary | ICD-10-CM

## 2016-09-11 DIAGNOSIS — I7300 Raynaud's syndrome without gangrene: Secondary | ICD-10-CM | POA: Insufficient documentation

## 2016-09-11 HISTORY — PX: UPPER EXTREMITY ANGIOGRAPHY: CATH118270

## 2016-09-11 HISTORY — PX: AORTIC ARCH ANGIOGRAPHY: CATH118224

## 2016-09-11 LAB — POCT I-STAT, CHEM 8
BUN: 14 mg/dL (ref 6–20)
Calcium, Ion: 1.16 mmol/L (ref 1.15–1.40)
Chloride: 110 mmol/L (ref 101–111)
Creatinine, Ser: 0.6 mg/dL (ref 0.44–1.00)
Glucose, Bld: 91 mg/dL (ref 65–99)
HCT: 36 % (ref 36.0–46.0)
Hemoglobin: 12.2 g/dL (ref 12.0–15.0)
Potassium: 3.6 mmol/L (ref 3.5–5.1)
Sodium: 142 mmol/L (ref 135–145)
TCO2: 21 mmol/L (ref 0–100)

## 2016-09-11 LAB — PREGNANCY, URINE: Preg Test, Ur: NEGATIVE

## 2016-09-11 SURGERY — AORTIC ARCH ANGIOGRAPHY

## 2016-09-11 MED ORDER — LIDOCAINE HCL (PF) 1 % IJ SOLN
INTRAMUSCULAR | Status: AC
  Filled 2016-09-11: qty 30

## 2016-09-11 MED ORDER — LABETALOL HCL 5 MG/ML IV SOLN: 10.0000 mg | INTRAVENOUS | Status: DC | PRN

## 2016-09-11 MED ORDER — CLOPIDOGREL BISULFATE 300 MG PO TABS
300.0000 mg | ORAL_TABLET | Freq: Once | ORAL | Status: AC
  Administered 2016-09-11: 300 mg via ORAL

## 2016-09-11 MED ORDER — HEPARIN (PORCINE) IN NACL 2-0.9 UNIT/ML-% IJ SOLN
INTRAMUSCULAR | Status: AC
  Filled 2016-09-11: qty 500

## 2016-09-11 MED ORDER — HYDRALAZINE HCL 20 MG/ML IJ SOLN: 5.0000 mg | INTRAMUSCULAR | Status: DC | PRN

## 2016-09-11 MED ORDER — HEPARIN (PORCINE) IN NACL 2-0.9 UNIT/ML-% IJ SOLN
INTRAMUSCULAR | Status: DC | PRN
  Administered 2016-09-11: 500 mL via INTRA_ARTERIAL

## 2016-09-11 MED ORDER — CLOPIDOGREL BISULFATE 75 MG PO TABS: 75.0000 mg | ORAL_TABLET | Freq: Every day | ORAL | 11 refills | Status: DC

## 2016-09-11 MED ORDER — SODIUM CHLORIDE 0.45 % IV SOLN
INTRAVENOUS | Status: DC
  Administered 2016-09-11: 625 mL via INTRAVENOUS

## 2016-09-11 MED ORDER — ACETAMINOPHEN 325 MG PO TABS
325.0000 mg | ORAL_TABLET | ORAL | Status: DC | PRN
  Filled 2016-09-11: qty 2

## 2016-09-11 MED ORDER — CLOPIDOGREL BISULFATE 300 MG PO TABS
ORAL_TABLET | ORAL | Status: AC
  Filled 2016-09-11: qty 1

## 2016-09-11 MED ORDER — CLOPIDOGREL BISULFATE 75 MG PO TABS: 75.0000 mg | ORAL_TABLET | Freq: Every day | ORAL | Status: DC

## 2016-09-11 MED ORDER — MIDAZOLAM HCL 2 MG/2ML IJ SOLN
INTRAMUSCULAR | Status: AC
  Filled 2016-09-11: qty 2

## 2016-09-11 MED ORDER — ONDANSETRON HCL 4 MG/2ML IJ SOLN: 4.0000 mg | Freq: Four times a day (QID) | INTRAMUSCULAR | Status: DC | PRN

## 2016-09-11 MED ORDER — SODIUM CHLORIDE 0.9 % IV SOLN: INTRAVENOUS | Status: DC

## 2016-09-11 MED ORDER — MORPHINE SULFATE (PF) 10 MG/ML IV SOLN
2.0000 mg | INTRAVENOUS | Status: DC | PRN
  Administered 2016-09-11: 2 mg via INTRAVENOUS

## 2016-09-11 MED ORDER — MIDAZOLAM HCL 2 MG/2ML IJ SOLN
INTRAMUSCULAR | Status: DC | PRN
  Administered 2016-09-11: 1 mg via INTRAVENOUS

## 2016-09-11 MED ORDER — MORPHINE SULFATE (PF) 4 MG/ML IV SOLN
INTRAVENOUS | Status: AC
  Filled 2016-09-11: qty 1

## 2016-09-11 MED ORDER — IODIXANOL 320 MG/ML IV SOLN
INTRAVENOUS | Status: DC | PRN
  Administered 2016-09-11: 85 mL via INTRAVENOUS

## 2016-09-11 MED ORDER — LIDOCAINE HCL (PF) 1 % IJ SOLN
INTRAMUSCULAR | Status: DC | PRN
  Administered 2016-09-11: 20 mL

## 2016-09-11 MED ORDER — ACETAMINOPHEN 325 MG RE SUPP
325.0000 mg | RECTAL | Status: DC | PRN
  Filled 2016-09-11: qty 2

## 2016-09-11 SURGICAL SUPPLY — 8 items
CATH ANGIO 5F BER2 100CM (CATHETERS) ×1 IMPLANT
CATH ANGIO 5F PIGTAIL 100CM (CATHETERS) ×1 IMPLANT
KIT PV (KITS) ×2 IMPLANT
SHEATH PINNACLE 5F 10CM (SHEATH) ×2 IMPLANT
SYR MEDRAD MARK V 150ML (SYRINGE) ×2 IMPLANT
TRANSDUCER W/STOPCOCK (MISCELLANEOUS) ×2 IMPLANT
TRAY PV CATH (CUSTOM PROCEDURE TRAY) ×2 IMPLANT
WIRE HITORQ VERSACORE ST 145CM (WIRE) ×1 IMPLANT

## 2016-09-11 NOTE — Discharge Instructions (Signed)
Return to Work [name]  was treated at our facility. Injury or illness was: ___Work related _xx__Not work related ___Undetermined if work related Return to work No work 09/11/16  09/12/16  Employee may return to work on ______________________.  Employee may return to modified work on _3/6/18 no more than 10 lb lift. 09/16/16 regular work duty_. Work activity restrictions Work activities that are not tolerated include: ___Bending ___Prolonged sitting ___Lifting more than ________ lb ___Squatting ___ Prolonged standing ___Climbing ___Reaching ___Pushing and pulling ___ Walking ___Other ______________________ These restrictions are effective until ______________________. Show this Return to Work statement to your supervisor at work as soon as possible. Your employer should be aware of your condition and can help with the necessary work activity restrictions. If you wish to return to work sooner than the date that is listed above, or if you have further problems that make it difficult for you to return at that time, please call our clinic or your health care provider. _Dr C. Fields/ Michelle Villa RN________________________________________ [facility] Provider Name (printed) _________________________________________ [facility] Provider (signature) _________________________________________  Date 09/11/16        This information is not intended to replace advice given to you by your health care provider. Make sure you discuss any questions you have with your health care provider. Document Released: 06/29/2005 Document Revised: 06/12/2016 Document Reviewed: 01/26/2014 Elsevier Interactive Patient Education  2017 [employer] Angiogram, Care After This sheet gives you information about how to care for yourself after your procedure. Your health care provider may also give you more specific instructions. If you have problems or questions, contact your health care  provider. What can I expect after the procedure? After the procedure, it is common to have bruising and tenderness at the catheter insertion area. Follow these instructions at home: Insertion site care   Follow instructions from your health care provider about how to take care of your insertion site. Make sure you:  Wash your hands with soap and water before you change your bandage (dressing). If soap and water are not available, use hand sanitizer.  Change your dressing as told by your health care provider.  Leave stitches (sutures), skin glue, or adhesive strips in place. These skin closures may need to stay in place for 2 weeks or longer. If adhesive strip edges start to loosen and curl up, you may trim the loose edges. Do not remove adhesive strips completely unless your health care provider tells you to do that.  Do not take baths, swim, or use a hot tub until your health care provider approves.  You may shower 24-48 hours after the procedure or as told by your health care provider.  Gently wash the site with plain soap and water.  Pat the area dry with a clean towel.  Do not rub the site. This may cause bleeding.  Do not apply powder or lotion to the site. Keep the site clean and dry.  Check your insertion site every day for signs of infection. Check for:  Redness, swelling, or pain.  Fluid or blood.  Warmth.  Pus or a bad smell. Activity   Rest as told by your health care provider, usually for 1-2 days.  Do not lift anything that is heavier than 10 lbs. (4.5 kg) or as told by your health care provider.  Do not drive for 24 hours if you were given a medicine to help you relax (sedative).  Do not drive or use heavy machinery while taking prescription  pain medicine. General instructions   Return to your normal activities as told by your health care provider, usually in about a week. Ask your health care provider what activities are safe for you.  If the catheter  site starts bleeding, lie flat and put pressure on the site. If the bleeding does not stop, get help right away. This is a medical emergency.  Drink enough fluid to keep your urine clear or pale yellow. This helps flush the contrast dye from your body.  Take over-the-counter and prescription medicines only as told by your health care provider.  Keep all follow-up visits as told by your health care provider. This is important. Contact a health care provider if:  You have a fever or chills.  You have redness, swelling, or pain around your insertion site.  You have fluid or blood coming from your insertion site.  The insertion site feels warm to the touch.  You have pus or a bad smell coming from your insertion site.  You have bruising around the insertion site.  You notice blood collecting in the tissue around the catheter site (hematoma). The hematoma may be painful to the touch. Get help right away if:  You have severe pain at the catheter insertion area.  The catheter insertion area swells very fast.  The catheter insertion area is bleeding, and the bleeding does not stop when you hold steady pressure on the area.  The area near or just beyond the catheter insertion site becomes pale, cool, tingly, or numb. These symptoms may represent a serious problem that is an emergency. Do not wait to see if the symptoms will go away. Get medical help right away. Call your local emergency services (911 in the [place] Do not drive yourself to the hospital. Summary  After the procedure, it is common to have bruising and tenderness at the catheter insertion area.  After the procedure, it is important to rest and drink plenty of fluids.  Do not take baths, swim, or use a hot tub until your health care provider says it is okay to do so. You may shower 24-48 hours after the procedure or as told by your health care provider.  If the catheter site starts bleeding, lie flat and put pressure on the  site. If the bleeding does not stop, get help right away. This is a medical emergency. This information is not intended to replace advice given to you by your health care provider. Make sure you discuss any questions you have with your health care provider. Document Released: 01/15/2005 Document Revised: 06/03/2016 Document Reviewed: 06/03/2016 Elsevier Interactive Patient Education  2017 [employer]

## 2016-09-11 NOTE — Telephone Encounter (Signed)
-----   Message from Sharee Pimple, RN sent at 09/11/2016 12:48 PM EST ----- Regarding:  CTA of chest and appt w/ [facility] next week    ----- Message ----- From: Sherren Kerns, MD Sent: 09/11/2016   8:38 AM To: [facility] Charge Pool  Arch aortogram, [place] right groin, first order left subclavian artery.  She needs CT angio of chest next week and see me on Thursday after the CT scan  Fabienne Bruns

## 2016-09-11 NOTE — Progress Notes (Addendum)
Site area: RFA Site Prior to Removal:  Level 0 Pressure Applied For: 30 min Manual:   yes Patient Status During Pull:  stable Post Pull Site:  Level 0 Post Pull Instructions Given:  yes Post Pull Pulses Present: palpable Dressing Applied:  tegaderm Bedrest begins @  0920 1420 Comments: growth seen post 25 min

## 2016-09-11 NOTE — Interval H&P Note (Signed)
History and Physical Interval Note:  09/11/2016 7:36 AM  Michelle Villa  has presented today for surgery, with the diagnosis of left radial artery accultion  The various methods of treatment have been discussed with the patient and family. After consideration of risks, benefits and other options for treatment, the patient has consented to  Procedure(s): Aortic Arch Angiography (N/A) Upper Extremity Angiography - Left (N/A) as a surgical intervention .  The patient's history has been reviewed, patient examined, no change in status, stable for surgery.  I have reviewed the patient's chart and labs.  Questions were answered to the patient's satisfaction.     Fabienne BrunsFields, Charles

## 2016-09-11 NOTE — Op Note (Signed)
Procedure: Arch aortogram left upper extremity angiogram  Preoperative diagnosis: Left hand ischemia  Postoperative diagnosis: Same  Anesthesia: Local with sedation  Operative findings: #1 ulcerated narrowing midportion left subclavian artery adjacent to first rib suggestive of thoracic outlet syndrome   #2 left brachial artery occlusion ulnar artery occlusion radial artery occlusion with outflow via the interosseous and minimal digital artery flow  Operative details: After obtaining informed consent, the patient was taken to the PV lab. The patient was placed in supine position the Angio table. Both groins were prepped and draped in usual sterile fashion. Local anesthesia was infiltrated over the right common femoral artery. Ultrasound was used to identify the right common femoral artery. An introducer needle was used to cannulate the right common femoral artery without difficulty. An 035 versacore wire was then threaded up into the abdominal aorta under fluoroscopic guidance a 5 French sheath was placed over the guidewire into the right common femoral artery. This was thoroughly flushed with heparinized saline. Next a 5 [place] pig cath was placed over the guidewire and these were advanced as a unit up into the aortic arch. An arch aortogram was performed in a LAO projection a 40. This showed normal arch anatomy with widely patent innominate right common and right subclavian arteries area the visualized portion of the right vertebral artery is widely patent. The left subclavian artery is patent at its origin. The left vertebral artery is widely patent. The left common carotid artery is widely patent. There is a 50% stenosis with some ulceration and irregularity in the midportion of the left subclavian artery distal to the vertebral and adjacent to where the first rib crosses over this. Next the pig catheter was removed over a guidewire and exchanged for a 5 [place] Berenstein 2 catheter. This was used  to selectively catheterize the proximal left subclavian artery. Contrast angiogram was then performed of the left upper extremity. The distal left subclavian left axillary and proximal left brachial artery is patent. The brachial artery is occluded at the takeoff of the runoff vessels. There is thrombus within the ulnar artery proximally. There is no flow seen within the radial artery. It does reconstitute for a short segment at the level of the wrist. The interosseous artery is the primary runoff to the hand. There is minimal digital artery flow only to the third digit.  At this point the [facility] 2 catheter was pulled back over a guidewire. The 5 French sheath was left in place to be pulled in the holding area. This was thoroughly flushed with heparinized saline.  Operative management: The patient most likely has a left thoracic outlet syndrome that has caused injury to her left subclavian artery and then produced an atheroembolic event to her left upper extremity. We will obtain a CT angiogram of her early next week to make sure that this is not aneurysmal development of the subclavian artery as opposed to narrowing occlusive disease. The patient will eventually require left first rib resection left brachial artery endarterectomy and thrombectomy of her radial and ulnar arteries and possible reconstruction of her left subclavian with either stenting or possible open repair. All of these findings were discussed with the patient today. She was also started on Plavix in addition to her aspirin to reduce risk of embolic events while we are doing operative planning.  Fabienne Brunsharles Fields, MD [facility] [place] Office: [phone] Pager: [phone]

## 2016-09-11 NOTE — Telephone Encounter (Signed)
Sched CTA 09/16/16 at 3:00 at [facility]. Sched [facility] 09/17/16 at 11:45. Lm on hm# to inform pt of appts.

## 2016-09-11 NOTE — H&P (View-Only) (Signed)
Referring Physician: Dr Lucia GaskinsAhern  Patient name: Michelle Villa MRN: [id number] DOB: [birth year] Sex: female  REASON FOR CONSULT: Left hand pain  HPI: Michelle Villa is a 31 y.o. female, with a history of left shoulder and hand pain for about 4 months. The patient denies any trauma. She states that all of the symptoms have improved slightly since November. She is currently getting physical therapy. She was seen by neurology for evaluation of this. She had a negative EMG and cervical spine MRI. 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 The patient denies any family history of early atherosclerosis. She denies any prior history of IV drug abuse. She had been told in the past that she had Raynaud's. However, her symptoms always been unilateral on the left side. Her primary complaint is pain in digits 1, 2 and 3 of the left hand with numbness. She also occasionally has pain extending from the midforearm down to the hand. She was placed on aspirin recently.  Past Medical History:  Diagnosis Date  . Raynaud phenomenon    Past Surgical History:  Procedure Laterality Date  . WISDOM TOOTH EXTRACTION      Family History  Problem Relation Age of Onset  . Hypertension Father   . Hyperlipidemia Father   . Neuropathy Neg Hx     SOCIAL HISTORY: Social History   Social History  . Marital status: Single    Spouse name: N/A  . Number of children: N/A  . Years of education: N/A   Occupational History  . waitress    Social History Main Topics  . Smoking status: Never Smoker  . Smokeless tobacco: Never Used  . Alcohol use 4.2 oz/week    7 Standard drinks or equivalent per week  . Drug use: Yes    Types: Marijuana  . Sexual activity: Not on file   Other Topics Concern  . Not on file   Social History Narrative  . No narrative on file    No Known Allergies  Current Outpatient Prescriptions  Medication Sig  Dispense Refill  . aspirin EC 81 MG tablet Take 81 mg by mouth daily.    . Cyanocobalamin (VITAMIN B-12) 3000 MCG SUBL Place 3,000 mcg under the tongue daily.     . drospirenone-ethinyl estradiol (YASMIN,ZARAH,SYEDA) 3-0.03 MG tablet Take 1 tablet by mouth daily.    . Multiple Vitamins-Minerals (ECHINACEA ACZ PO) Take 1 tablet by mouth daily.     . Multiple Vitamins-Minerals (MULTI COMPLETE PO) Take 1 tablet by mouth daily.     . traMADol (ULTRAM) 50 MG tablet Take 50-100 mg by mouth 2 (two) times daily as needed for moderate pain.     Marland Kitchen. OVER THE COUNTER MEDICATION Take 1 tablet by mouth at bedtime as needed (sleep). Passion Flower otc sleep aid     No current facility-administered medications for this visit.     ROS:   General:  No weight loss, Fever, chills  HEENT: No recent headaches, no nasal bleeding, no visual changes, no sore throat  Neurologic: No dizziness, blackouts, seizures. No recent symptoms of stroke or mini- stroke. No recent episodes of slurred speech, or temporary blindness.  Cardiac: No recent episodes of chest pain/pressure, no shortness of breath at rest.  No shortness of breath with exertion.  Denies history of atrial fibrillation or irregular heartbeat  Vascular: No history of rest pain in feet.  No history of claudication.  No  history of non-healing ulcer, No history of DVT   Pulmonary: No home oxygen, no productive cough, no hemoptysis,  No asthma or wheezing  Musculoskeletal:  [ ]  Arthritis, [ ]  Low back pain,  [ ]  Joint pain  Hematologic:No history of hypercoagulable state.  No history of easy bleeding.  No history of anemia  Gastrointestinal: No hematochezia or melena,  No gastroesophageal reflux, no trouble swallowing  Urinary: [ ]  chronic Kidney disease, [ ]  on HD - [ ]  MWF or [ ]  TTHS, [ ]  Burning with urination, [ ]  Frequent urination, [ ]  Difficulty urinating;   Skin: No rashes  Psychological: No history of anxiety,  No history of  depression   Physical Examination  Vitals:   09/09/16 0928  BP: 127/78  Pulse: 97  Resp: 16  Temp: 98.8 F (37.1 C)  TempSrc: Oral  SpO2: 98%  Weight: 144 lb (65.3 kg)  Height: 5\' 3"  (1.6 m)    Body mass index is 25.51 kg/m.  General:  Alert and oriented, no acute distress HEENT: Normal Neck: No bruit or JVD Pulmonary: Clear to auscultation bilaterally Cardiac: Regular Rate and Rhythm without murmur Abdomen: Soft, non-tender, non-distended, no mass Skin: No rash or ulcer, left hand slightly cooler than the right Extremity Pulses:  2+ radial, brachial, femoral, dorsalis pedis, posterior tibial pulses with the exception of left upper extremity, she has a 1+ left radial pulse absent ulnar pulse  Musculoskeletal: No deformity or edema  Neurologic: Upper and lower extremity motor 5/5 and symmetric  DATA: She had a left upper extremity duplex performed on 08/18/2016. I reviewed this today. This showed patency of her left subclavian axillary brachial and radial arteries. There was triphasic flow. Her ulnar artery was occluded over the entire segment.   ASSESSMENT:  Left hand numbness tingling with decreased temperature known ulnar artery occlusion in a patient with no real known risk factors for this.   PLAN:  Arch aortogram left upper extremity arteriogram. Softener symptoms sound like thoracic outlet syndrome so there is a possibility she could have a area of her artery that embolized to her ulnar artery. She does not have history of atrial fibrillation. Risk benefits possible palpitations and procedure details including not limited to stroke bleeding vessel injury were discussed with the patient today. She understands and agrees to proceed. She will continue her aspirin. We'll consider hematology workup depending on whether or not she has any significant findings on her arteriogram. If she has nothing that is reconstructable consideration may need to be given for possible  sympathectomy. I believe Raynaud's is less likely since she only has unilateral symptoms.   Fabienne Bruns, MD [facility] Office: [phone] Pager: [phone]

## 2016-09-12 ENCOUNTER — Telehealth: Payer: Self-pay | Admitting: Hematology

## 2016-09-12 ENCOUNTER — Encounter: Payer: Self-pay | Admitting: Hematology

## 2016-09-12 NOTE — Telephone Encounter (Signed)
Appt has been scheduled for the pt to see Dr. Candise CheKale on 3/28 at 11am. Aware to arrive 30 minutes early. Demographics verified. Pt agreed to the appt date and time. Letter mailed.

## 2016-09-14 ENCOUNTER — Ambulatory Visit: Payer: BLUE CROSS/BLUE SHIELD | Attending: Neurology

## 2016-09-14 DIAGNOSIS — L231 Allergic contact dermatitis due to adhesives: Secondary | ICD-10-CM | POA: Diagnosis not present

## 2016-09-14 DIAGNOSIS — M6281 Muscle weakness (generalized): Secondary | ICD-10-CM | POA: Insufficient documentation

## 2016-09-14 DIAGNOSIS — M542 Cervicalgia: Secondary | ICD-10-CM | POA: Diagnosis not present

## 2016-09-14 DIAGNOSIS — E039 Hypothyroidism, unspecified: Secondary | ICD-10-CM | POA: Diagnosis not present

## 2016-09-14 DIAGNOSIS — M25512 Pain in left shoulder: Secondary | ICD-10-CM | POA: Insufficient documentation

## 2016-09-14 DIAGNOSIS — F418 Other specified anxiety disorders: Secondary | ICD-10-CM | POA: Diagnosis not present

## 2016-09-14 MED FILL — Morphine Sulfate Inj 4 MG/ML: INTRAMUSCULAR | Qty: 1 | Status: AC

## 2016-09-14 NOTE — Patient Instructions (Addendum)
KEEP HEAD IN NEUTRAL AND SHOULDERS DOWN AND RELAXED   Hold tubing in right hand, arm forward. Pull arm back, elbow straight. Repeat __10__ times per set. Do __2__ sets per session. Do _1-2___ sessions per day.  Copyright  VHI. All rights reserved.     With resistive band anchored in door, grasp both ends. Keeping elbows bent, pull back, squeezing shoulder blades together. Hold _3__ seconds. Repeat _2x10___ times. Do _1-2___ sessions per day.  [email or web address].[email or web address]   [facility] [address], [address] [place], [place] [address] Phone # [phone] Fax [phone] 

## 2016-09-14 NOTE — Therapy (Signed)
[facility] [address] [place], [place], [address] Phone: [phone]   Fax:  [phone]  Physical Therapy Treatment  Patient Details  Name: Michelle Villa MRN: [id number] Date of Birth: 12-06-1985 Referring Provider: Melvenia Beam, MD  Encounter Date: 09/14/2016      PT End of Session - 09/14/16 1001    Visit Number 12   PT Start Time 0931   PT Stop Time 1017   PT Time Calculation (min) 46 min   Activity Tolerance Patient tolerated treatment well   Behavior During Therapy [facility] for tasks assessed/performed      Past Medical History:  Diagnosis Date  . Raynaud phenomenon     Past Surgical History:  Procedure Laterality Date  . AORTIC ARCH ANGIOGRAPHY N/A 09/11/2016   Procedure: Aortic Arch Angiography;  Surgeon: Elam Dutch, MD;  Location: [place] [facility];  Service: Cardiovascular;  Laterality: N/A;  . UPPER EXTREMITY ANGIOGRAPHY N/A 09/11/2016   Procedure: Upper Extremity Angiography - Left;  Surgeon: Elam Dutch, MD;  Location: [place] [facility];  Service: Cardiovascular;  Laterality: N/A;  . WISDOM TOOTH EXTRACTION      There were no vitals filed for this visit.      Subjective Assessment - 09/14/16 0937    Subjective Pt took a week off to determine if doing exercises was helpful.  80% overall improvement since the start of care.  Pt had angiogram in Rt groin last week.  Rt hip flexor is sore.     Currently in Pain? Yes   Pain Score 0-No pain            [facility] PT Assessment - 09/14/16 0001      Assessment   Medical Diagnosis R29.898 Lt arm weakness; M50.10 cervical disc disorder with radiculopathy of cervical region; R20.2 Lt hand parasthesia     Observation/Other Assessments   Focus on Therapeutic Outcomes (FOTO)  32% limitation     Strength   Left Shoulder Flexion 4+/5   Left Shoulder ABduction 4+/5   Left Shoulder Internal Rotation 4+/5   Left Shoulder External Rotation  4/5   Right Hand Grip (lbs) 65   Left Hand Grip (lbs) 65                     [facility] Adult PT Treatment/Exercise - 09/14/16 0001      Shoulder Exercises: Standing   Horizontal ABduction Strengthening;Left;20 reps;Theraband   Theraband Level (Shoulder Horizontal ABduction) Level 2 (Red)  on foam roll   External Rotation Strengthening;Left;15 reps;Theraband   Theraband Level (Shoulder External Rotation) Level 2 (Red)  on foam roll   Extension Strengthening;Both;20 reps   Theraband Level (Shoulder Extension) Level 3 (Green)   Row Strengthening;Both;20 reps     Modalities   Modalities Electrical Stimulation;Moist Heat     Moist Heat Therapy   Number Minutes Moist Heat 15 Minutes   Moist Heat Location Cervical;Shoulder     Electrical Stimulation   Electrical Stimulation Location Lt scapular border   Electrical Stimulation Action IFC   Electrical Stimulation Parameters 15 minutes   Electrical Stimulation Goals Pain                PT Education - 09/14/16 [phone]    Education provided Yes   Education Details scapular attached   Person(s) Educated Patient   Methods Explanation;Handout;Demonstration   Comprehension Verbalized understanding;Returned demonstration          PT Short Term  Goals - 09/02/16 1115      PT SHORT TERM GOAL #1   Title independent with initial HEP   Time 4   Period Weeks   Status Achieved     PT SHORT TERM GOAL #2   Title MMT 4+/5 flexion and abduction to demonstrate increased strength for lifting   Time 4   Period Weeks   Status On-going     PT SHORT TERM GOAL #3   Title pain decreased by 30% for improved function at work   Time 4   Period Weeks   Status Achieved           PT Long Term Goals - 09/14/16 0942      PT LONG TERM GOAL #1   Title independnet with advanced HEP and able to return to exercising at the gym   Status Partially Met     PT LONG TERM GOAL #2   Title FOTO < or  = to 27% limitation   Status  Partially Met     PT LONG TERM GOAL #3   Title pain reduced by 70% or more for improved function at work and return to activities   Status Achieved     PT LONG TERM GOAL #4   Status Achieved               Plan - 09/14/16 1001    Clinical Impression Statement Pt reports 80% overall improvement and is ready for D/C.  Rt grip and shoulder strength have improved.  All goals met except FOTO score.  Pt will continue to address posture, strength and flexibility.     PT Next Visit Plan D/C PT to HEP   Consulted and Agree with Plan of Care Patient      Patient will benefit from skilled therapeutic intervention in order to improve the following deficits and impairments:     Visit Diagnosis: Muscle weakness (generalized)  Acute pain of left shoulder  Cervicalgia     Problem List Patient Active Problem List   Diagnosis Date Noted  . Raynaud's phenomenon 07/20/2016   PHYSICAL THERAPY DISCHARGE SUMMARY  Visits from Start of Care: 12  Current functional level related to goals / functional outcomes: Pt reports 80% overall improvement since the start of care.  Pt will D/C to HEP.     Remaining deficits: Lt UE weakness, postural weakness.  Pt has HEP in place to address remaining deficits.     Education / Equipment: HEP, posture/body mechanics Plan: Patient agrees to discharge.  Patient goals were partially met. Patient is being discharged due to being pleased with the current functional level.  ?????        Sigurd Sos, PT 09/14/16 10:05 AM  [facility] [address], [place], [place], [address] Phone: [phone]   Fax:  [phone]  Name: Michelle Villa MRN: [id number] Date of Birth: [birth year]

## 2016-09-16 ENCOUNTER — Ambulatory Visit
Admit: 2016-09-16 | Discharge: 2016-09-16 | Disposition: A | Discharge status: Home / Self Care | Payer: BLUE CROSS/BLUE SHIELD | Attending: Vascular Surgery | Admitting: Vascular Surgery

## 2016-09-16 DIAGNOSIS — G540 Brachial plexus disorders: Secondary | ICD-10-CM

## 2016-09-16 DIAGNOSIS — Z01810 Encounter for preprocedural cardiovascular examination: Secondary | ICD-10-CM

## 2016-09-16 DIAGNOSIS — I771 Stricture of artery: Secondary | ICD-10-CM | POA: Diagnosis not present

## 2016-09-16 MED ORDER — IOPAMIDOL (ISOVUE-370) INJECTION 76%
75.0000 mL | Freq: Once | INTRAVENOUS | Status: AC | PRN
  Administered 2016-09-16: 75 mL via INTRAVENOUS

## 2016-09-17 ENCOUNTER — Other Ambulatory Visit: Payer: Self-pay | Admitting: *Deleted

## 2016-09-17 ENCOUNTER — Encounter: Payer: Self-pay | Admitting: Vascular Surgery

## 2016-09-17 ENCOUNTER — Ambulatory Visit (HOSPITAL_COMMUNITY)
Admission: RE | Admit: 2016-09-17 | Discharge: 2016-09-17 | Disposition: A | Discharge status: Home / Self Care | Payer: BLUE CROSS/BLUE SHIELD | Source: Ambulatory Visit | Attending: Vascular Surgery | Admitting: Vascular Surgery

## 2016-09-17 ENCOUNTER — Ambulatory Visit (INDEPENDENT_AMBULATORY_CARE_PROVIDER_SITE_OTHER): Payer: BLUE CROSS/BLUE SHIELD | Admitting: Vascular Surgery

## 2016-09-17 ENCOUNTER — Telehealth: Payer: Self-pay | Admitting: *Deleted

## 2016-09-17 ENCOUNTER — Other Ambulatory Visit: Payer: Self-pay

## 2016-09-17 VITALS — BP 129/82 | HR 91 | Temp 98.5°F | Resp 20 | Ht 63.0 in | Wt 145.4 lb

## 2016-09-17 DIAGNOSIS — I728 Aneurysm of other specified arteries: Principal | ICD-10-CM

## 2016-09-17 DIAGNOSIS — IMO0001 Reserved for inherently not codable concepts without codable children: Secondary | ICD-10-CM

## 2016-09-17 DIAGNOSIS — R1903 Right lower quadrant abdominal swelling, mass and lump: Secondary | ICD-10-CM | POA: Insufficient documentation

## 2016-09-17 DIAGNOSIS — T81719A Complication of unspecified artery following a procedure, not elsewhere classified, initial encounter: Secondary | ICD-10-CM

## 2016-09-17 DIAGNOSIS — Z9889 Other specified postprocedural states: Secondary | ICD-10-CM | POA: Insufficient documentation

## 2016-09-17 DIAGNOSIS — I739 Peripheral vascular disease, unspecified: Secondary | ICD-10-CM | POA: Insufficient documentation

## 2016-09-17 DIAGNOSIS — I998 Other disorder of circulatory system: Secondary | ICD-10-CM | POA: Insufficient documentation

## 2016-09-17 DIAGNOSIS — G540 Brachial plexus disorders: Secondary | ICD-10-CM | POA: Diagnosis not present

## 2016-09-17 DIAGNOSIS — E041 Nontoxic single thyroid nodule: Secondary | ICD-10-CM

## 2016-09-17 DIAGNOSIS — E049 Nontoxic goiter, unspecified: Secondary | ICD-10-CM

## 2016-09-17 NOTE — Telephone Encounter (Signed)
-----   Message from Sherren Kernsharles E Fields, MD sent at 09/17/2016  4:58 PM EST ----- Can you get her scheduled for thyroid US prior to her operation  Fabienne Brunsharles Fields ----- Message ----- From: Leory PlowmanInterface, Rad Results In Sent: 09/17/2016   7:29 AM To: Sherren Kernsharles E Fields, MD

## 2016-09-17 NOTE — Progress Notes (Signed)
Referring Physician: Dr Lucia Gaskins   Patient name: Michelle Villa     MRN: [id number]        DOB: 07/25/1985          Sex: female   REASON FOR CONSULT: Left hand pain   HPI: Samanta Gal is a 31 y.o. female, with a history of left shoulder and hand pain for about 4 months. The patient denies any trauma. She states that all of the symptoms have improved slightly since November. She is currently getting physical therapy. She was seen by neurology for evaluation of this. She had a negative EMG and cervical spine MRI. 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 The patient denies any family history of early atherosclerosis. She denies any prior history of IV drug abuse. She had been told in the past that she had Raynaud's. However, her symptoms always been unilateral on the left side. Her primary complaint is pain in digits 1, 2 and 3 of the left hand with numbness. She also occasionally has pain extending from the midforearm down to the hand. She was placed on aspirin recently.  She had an upper extremity arteriogram last week which showed a 70% narrowing of her left subclavian artery at the thoracic outlet between the clavicle and first rib. This also showed multivessel occlusion below the left brachial artery with primary runoff via the interosseous artery and reconstitution of the distal radial artery. She was placed on Plavix. She returns to the office today after recent CT Angio to make sure she did not have aneurysmal development surrounding the artery.       Past Medical History:  Diagnosis Date  . Raynaud phenomenon      Past Surgical History:  Procedure Laterality Date  . WISDOM TOOTH EXTRACTION               Family History  Problem Relation Age of Onset  . Hypertension Father    . Hyperlipidemia Father    . Neuropathy Neg Hx        SOCIAL HISTORY: Social History         Social History  . Marital  status: Single      Spouse name: N/A  . Number of children: N/A  . Years of education: N/A        Occupational History  . waitress           Social History Main Topics  . Smoking status: Never Smoker  . Smokeless tobacco: Never Used  . Alcohol use 4.2 oz/week      7 Standard drinks or equivalent per week  . Drug use: Yes      Types: Marijuana  . Sexual activity: Not on file        Other Topics Concern  . Not on file       Social History Narrative  . No narrative on file      No Known Allergies         Current Outpatient Prescriptions  Medication Sig Dispense Refill  . aspirin EC 81 MG tablet Take 81 mg by mouth daily.      . Cyanocobalamin (VITAMIN B-12) 3000 MCG SUBL Place 3,000 mcg under the tongue daily.       . drospirenone-ethinyl estradiol (YASMIN,ZARAH,SYEDA) 3-0.03 MG tablet Take 1 tablet by mouth daily.      . Multiple Vitamins-Minerals (ECHINACEA ACZ PO) Take 1 tablet by  mouth daily.       . Multiple Vitamins-Minerals (MULTI COMPLETE PO) Take 1 tablet by mouth daily.       . traMADol (ULTRAM) 50 MG tablet Take 50-100 mg by mouth 2 (two) times daily as needed for moderate pain.       Marland Kitchen. OVER THE COUNTER MEDICATION Take 1 tablet by mouth at bedtime as needed (sleep). Passion Flower otc sleep aid        No current facility-administered medications for this visit.       ROS:    General:  No weight loss, Fever, chills   HEENT: No recent headaches, no nasal bleeding, no visual changes, no sore throat   Neurologic: No dizziness, blackouts, seizures. No recent symptoms of stroke or mini- stroke. No recent episodes of slurred speech, or temporary blindness.   Cardiac: No recent episodes of chest pain/pressure, no shortness of breath at rest.  No shortness of breath with exertion.  Denies history of atrial fibrillation or irregular heartbeat   Vascular: No history of rest pain in feet.  No history of claudication.  No history of non-healing ulcer, No history of  DVT    Pulmonary: No home oxygen, no productive cough, no hemoptysis,  No asthma or wheezing   Musculoskeletal:  [ ]  Arthritis, [ ]  Low back pain,  [ ]  Joint pain   Hematologic:No history of hypercoagulable state.  No history of easy bleeding.  No history of anemia   Gastrointestinal: No hematochezia or melena,  No gastroesophageal reflux, no trouble swallowing   Urinary: [ ]  chronic Kidney disease, [ ]  on HD - [ ]  MWF or [ ]  TTHS, [ ]  Burning with urination, [ ]  Frequent urination, [ ]  Difficulty urinating;    Skin: No rashes   Psychological: No history of anxiety,  No history of depression     Physical Examination    Vitals:   09/17/16 1210  BP: 129/82  Pulse: 91  Resp: 20  Temp: 98.5 F (36.9 C)  TempSrc: Oral  SpO2: 100%  Weight: 145 lb 6.4 oz (66 kg)  Height: 5\' 3"  (1.6 m)     General:  Alert and oriented, no acute distress HEENT: Normal Cardiac: Regular Rate and Rhythm Abdomen: Soft, non-tender, non-distended, 1 cm mass right groin pulsatile some ecchymosis around her puncture site Skin: No rash or ulcer, left hand slightly cooler than the right Extremity Pulses:  2+ radial, brachial right side 2+ left brachial absent radial, 2+ femoral, dorsalis pedis, posterior tibial pulses with the exception of left upper extremity, she has an absent left radial pulse absent ulnar pulse  Musculoskeletal: No deformity or edema      Neurologic: Upper and lower extremity motor 5/5 and symmetric   DATA:  CT angiogram findings as listed above     ASSESSMENT:   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 Pulsatile mass right groin Will rule out pseudoaneurysm today.     PLAN:   first rib resection possible left subclavian artery stenting or patch angioplasty of left subclavian artery left brachial radial and ulnar thrombectomy scheduled for 09/28/2016. The patient will need to be out of  work for at least a week after the procedure. She will be an hospital for at least one day after the procedure. She will not be able to do any heavy lifting nothing greater than 5 pounds for 4-6 weeks.    Ultrasound  of right groin today showed no pseudoaneurysm   Fabienne Bruns, MD [facility] Office: [phone] Pager: [phone]

## 2016-09-17 NOTE — Telephone Encounter (Signed)
Called patient and left a message about Dr. Darrick PennaFields wanting to do thyroid ultrasound for incidental finding on CTA of chest. She has a goiter and a thyroid nodule. I will have our office schedule this and contact her.

## 2016-09-18 ENCOUNTER — Other Ambulatory Visit: Payer: Self-pay

## 2016-09-21 NOTE — Telephone Encounter (Signed)
Scheduled 3/14

## 2016-09-21 NOTE — Telephone Encounter (Signed)
It looks like the US is already scheduled. I didn't see any documentation on who scheduled it. Did she need to come back to the office?

## 2016-09-23 ENCOUNTER — Ambulatory Visit
Admission: RE | Admit: 2016-09-23 | Discharge: 2016-09-23 | Disposition: A | Discharge status: Home / Self Care | Payer: BLUE CROSS/BLUE SHIELD | Source: Ambulatory Visit | Attending: Vascular Surgery | Admitting: Vascular Surgery

## 2016-09-23 DIAGNOSIS — E049 Nontoxic goiter, unspecified: Secondary | ICD-10-CM

## 2016-09-23 DIAGNOSIS — E041 Nontoxic single thyroid nodule: Secondary | ICD-10-CM

## 2016-09-23 DIAGNOSIS — E042 Nontoxic multinodular goiter: Secondary | ICD-10-CM | POA: Diagnosis not present

## 2016-09-24 ENCOUNTER — Telehealth: Payer: Self-pay | Admitting: *Deleted

## 2016-09-24 DIAGNOSIS — E042 Nontoxic multinodular goiter: Secondary | ICD-10-CM

## 2016-09-24 NOTE — Telephone Encounter (Signed)
Per Dr. Darrick PennaFields after review of patient's thyroid scan;  Has thyroid nodules that will be investigated by ENT. Her surgery (Brachial embolectomy on 09-28-16) will proceed as planned.  I will have our appt secretary make this referral appt and then contact patient.  I called Villa LassoLynette and explained this plan in detail. She is agreeable to seeing ENT for these nodules and will await our callback.

## 2016-09-24 NOTE — Pre-Procedure Instructions (Signed)
Haywood LassoLynette Rider-Vega  09/24/2016      [facility] [address] - [place], [place] - [address] [place] [address] Phone: [phone] Fax: [phone]    Your procedure is scheduled on Monday, March 19  [facility] Admitting at 5:30 AM              Your surgery or procedure is scheduled for 7:30 AM   Call this number if you have problems the morning of surgery: [phone]   Remember:  Do not eat food or drink liquids after midnight Sunday, March 18.  Take these medicines the morning of surgery with A SIP OF WATER : take if needed and you tolerate without food:acetaminophen (TYLENOL), traMADol (ULTRAM).                 Hold Plavix as instructed by Dr Darrick PennaFields.               STOP taking Aspirin Products (Goody Powder, Excedrin Migraine), Ibuprofen (Advil), Naproxen (Aleve), Viatiams and Herbal Products (ie Fish Oil).                  [place]- Preparing For Surgery  Before surgery, you can play an important role. Because skin is not sterile, your skin needs to be as free of germs as possible. You can reduce the number of germs on your skin by washing with CHG (chlorahexidine gluconate) Soap before surgery.  CHG is an antiseptic cleaner which kills germs and bonds with the skin to continue killing germs even after washing.  Please do not use if you have an allergy to CHG or antibacterial soaps. If your skin becomes reddened/irritated stop using the CHG.  Do not shave (including legs and underarms) for at least 48 hours prior to first CHG shower. It is OK to shave your face.  Please follow these instructions carefully.   1. Shower the NIGHT BEFORE SURGERY and the MORNING OF SURGERY with CHG.   2. If you chose to wash your hair, wash your hair first as usual with your normal shampoo.  3. After you shampoo, rinse your hair and body thoroughly to remove the shampoo.  4. Use CHG as you would any other liquid soap. You  can apply CHG directly to the skin and wash gently with a scrungie or a clean washcloth.   5. Apply the CHG Soap to your body ONLY FROM THE NECK DOWN.  Do not use on open wounds or open sores. Avoid contact with your eyes, ears, mouth and genitals (private parts). Wash genitals (private parts) with your normal soap.  6. Wash thoroughly, paying special attention to the area where your surgery will be performed.  7. Thoroughly rinse your body with warm water from the neck down.  8. DO NOT shower/wash with your normal soap after using and rinsing off the CHG Soap.  9. Pat yourself dry with a CLEAN TOWEL.   10. Wear CLEAN PAJAMAS   11. Place CLEAN SHEETS on your bed the night of your first shower and DO NOT SLEEP WITH PETS.  Day of Surgery: Do not apply any deodorants/lotions. Please wear clean clothes to the hospital/surgery center.    Do not wear jewelry, make-up or nail polish.  Do not wear lotions, powders, or perfumes, or deodorant.  Do not shave 48 hours prior to surgery.  Men may shave face and neck.  Do not bring valuables to the hospital.  [facility] is not responsible for any belongings or valuables.  Contacts, dentures or bridgework may not be worn into surgery.  Leave your suitcase in the car.  After surgery it may be brought to your room.  For patients admitted to the hospital, discharge time will be determined by your treatment team.  Patients discharged the day of surgery will not be allowed to drive home.   Name and phone number of your driver: -  Special instructions: -  Please read over the following fact sheets that you were given.  Patient Instructions for Mupirocin Application, , Pain Booklet, Surgical Site Infections.

## 2016-09-24 NOTE — Telephone Encounter (Signed)
Scheduled appointment with Dr. Dillard Cannonhristopher Newman on 3/20 @ 1:30pm. I just realized that the patient has surgery the day before so she stated that she will call Dr. Allene PyoNewman's office and reschedule the appointment to a day more suitable for her.

## 2016-09-25 ENCOUNTER — Encounter (HOSPITAL_COMMUNITY): Payer: Self-pay

## 2016-09-25 ENCOUNTER — Encounter (HOSPITAL_COMMUNITY)
Admission: RE | Admit: 2016-09-25 | Discharge: 2016-09-25 | Disposition: A | Discharge status: Home / Self Care | Payer: BLUE CROSS/BLUE SHIELD | Source: Ambulatory Visit | Attending: Vascular Surgery | Admitting: Vascular Surgery

## 2016-09-25 DIAGNOSIS — R202 Paresthesia of skin: Secondary | ICD-10-CM | POA: Insufficient documentation

## 2016-09-25 DIAGNOSIS — Z0183 Encounter for blood typing: Secondary | ICD-10-CM | POA: Insufficient documentation

## 2016-09-25 DIAGNOSIS — M79642 Pain in left hand: Secondary | ICD-10-CM | POA: Insufficient documentation

## 2016-09-25 DIAGNOSIS — G540 Brachial plexus disorders: Secondary | ICD-10-CM | POA: Diagnosis not present

## 2016-09-25 DIAGNOSIS — Z01812 Encounter for preprocedural laboratory examination: Secondary | ICD-10-CM | POA: Diagnosis not present

## 2016-09-25 HISTORY — DX: Anxiety disorder, unspecified: F41.9

## 2016-09-25 LAB — COMPREHENSIVE METABOLIC PANEL
ALT: 37 U/L (ref 14–54)
AST: 35 U/L (ref 15–41)
Albumin: 4.1 g/dL (ref 3.5–5.0)
Alkaline Phosphatase: 54 U/L (ref 38–126)
Anion gap: 9 (ref 5–15)
BUN: 10 mg/dL (ref 6–20)
CO2: 24 mmol/L (ref 22–32)
Calcium: 9.3 mg/dL (ref 8.9–10.3)
Chloride: 110 mmol/L (ref 101–111)
Creatinine, Ser: 0.54 mg/dL (ref 0.44–1.00)
GFR calc Af Amer: >60 mL/min (ref >60)
GFR calc non Af Amer: >60 mL/min (ref >60)
Glucose, Bld: 84 mg/dL (ref 65–99)
Potassium: 4.2 mmol/L (ref 3.5–5.1)
Sodium: 143 mmol/L (ref 135–145)
Total Bilirubin: 0.4 mg/dL (ref 0.3–1.2)
Total Protein: 6.5 g/dL (ref 6.5–8.1)

## 2016-09-25 LAB — PROTIME-INR
INR: 1.03
Prothrombin Time: 13.5 seconds (ref 11.4–15.2)

## 2016-09-25 LAB — TYPE AND SCREEN
ABO/RH(D): O POS
Antibody Screen: NEGATIVE

## 2016-09-25 LAB — URINALYSIS, ROUTINE W REFLEX MICROSCOPIC
Bilirubin Urine: NEGATIVE
Glucose, UA: NEGATIVE mg/dL
Hgb urine dipstick: NEGATIVE
Ketones, ur: NEGATIVE mg/dL
Leukocytes, UA: NEGATIVE
Nitrite: NEGATIVE
Protein, ur: NEGATIVE mg/dL
Specific Gravity, Urine: 1.019 (ref 1.005–1.030)
pH: 5 (ref 5.0–8.0)

## 2016-09-25 LAB — CBC
HCT: 39.5 % (ref 36.0–46.0)
Hemoglobin: 13.6 g/dL (ref 12.0–15.0)
MCH: 33.7 pg (ref 26.0–34.0)
MCHC: 34.4 g/dL (ref 30.0–36.0)
MCV: 97.8 fL (ref 78.0–100.0)
Platelets: 236 10*3/uL (ref 150–400)
RBC: 4.04 MIL/uL (ref 3.87–5.11)
RDW: 12.4 % (ref 11.5–15.5)
WBC: 5.2 10*3/uL (ref 4.0–10.5)

## 2016-09-25 LAB — APTT: aPTT: 26 seconds (ref 24–36)

## 2016-09-25 LAB — HCG, SERUM, QUALITATIVE: Preg, Serum: NEGATIVE

## 2016-09-25 LAB — SURGICAL PCR SCREEN
MRSA, PCR: NEGATIVE
Staphylococcus aureus: POSITIVE — AB

## 2016-09-25 NOTE — Progress Notes (Signed)
Called and informed Michelle Villa of pcr results positive for MSSA. [facility].

## 2016-09-25 NOTE — Progress Notes (Signed)
PCP is Dr. Shirlean Mylararol Webb Denies ever seeing a cardiologist. Denies ever having a stress test, card cath, or echo. Denies any chest pain. Reports she stopped taking Plavix on the March 12th.

## 2016-09-27 LAB — ABO/RH: ABO/RH(D): O POS

## 2016-09-28 ENCOUNTER — Inpatient Hospital Stay (HOSPITAL_COMMUNITY): Payer: BLUE CROSS/BLUE SHIELD

## 2016-09-28 ENCOUNTER — Encounter (HOSPITAL_COMMUNITY): Payer: Self-pay | Admitting: *Deleted

## 2016-09-28 ENCOUNTER — Inpatient Hospital Stay (HOSPITAL_COMMUNITY): Payer: BLUE CROSS/BLUE SHIELD | Admitting: Anesthesiology

## 2016-09-28 ENCOUNTER — Encounter (HOSPITAL_COMMUNITY)
Admission: RE | Disposition: A | Discharge status: Home / Self Care | Payer: Self-pay | Source: Ambulatory Visit | Attending: Vascular Surgery

## 2016-09-28 ENCOUNTER — Inpatient Hospital Stay (HOSPITAL_COMMUNITY): Payer: BLUE CROSS/BLUE SHIELD | Admitting: Certified Registered"

## 2016-09-28 ENCOUNTER — Inpatient Hospital Stay (HOSPITAL_COMMUNITY)
Admission: RE | Admit: 2016-09-28 | Discharge: 2016-09-29 | DRG: 253 | Disposition: A | Discharge status: Home / Self Care | Payer: BLUE CROSS/BLUE SHIELD | Source: Ambulatory Visit | Attending: Vascular Surgery | Admitting: Vascular Surgery

## 2016-09-28 DIAGNOSIS — F419 Anxiety disorder, unspecified: Secondary | ICD-10-CM | POA: Diagnosis not present

## 2016-09-28 DIAGNOSIS — I771 Stricture of artery: Secondary | ICD-10-CM | POA: Diagnosis present

## 2016-09-28 DIAGNOSIS — L7632 Postprocedural hematoma of skin and subcutaneous tissue following other procedure: Secondary | ICD-10-CM | POA: Diagnosis not present

## 2016-09-28 DIAGNOSIS — G540 Brachial plexus disorders: Secondary | ICD-10-CM | POA: Diagnosis present

## 2016-09-28 DIAGNOSIS — Q765 Cervical rib: Secondary | ICD-10-CM | POA: Diagnosis present

## 2016-09-28 DIAGNOSIS — I97638 Postprocedural hematoma of a circulatory system organ or structure following other circulatory system procedure: Secondary | ICD-10-CM | POA: Diagnosis not present

## 2016-09-28 DIAGNOSIS — Z79899 Other long term (current) drug therapy: Secondary | ICD-10-CM | POA: Diagnosis not present

## 2016-09-28 DIAGNOSIS — Z7982 Long term (current) use of aspirin: Secondary | ICD-10-CM

## 2016-09-28 DIAGNOSIS — IMO0002 Reserved for concepts with insufficient information to code with codable children: Secondary | ICD-10-CM | POA: Diagnosis present

## 2016-09-28 DIAGNOSIS — M47014 Anterior spinal artery compression syndromes, thoracic region: Secondary | ICD-10-CM | POA: Diagnosis not present

## 2016-09-28 DIAGNOSIS — Y838 Other surgical procedures as the cause of abnormal reaction of the patient, or of later complication, without mention of misadventure at the time of the procedure: Secondary | ICD-10-CM | POA: Diagnosis not present

## 2016-09-28 DIAGNOSIS — I742 Embolism and thrombosis of arteries of the upper extremities: Secondary | ICD-10-CM | POA: Diagnosis not present

## 2016-09-28 DIAGNOSIS — M25512 Pain in left shoulder: Secondary | ICD-10-CM | POA: Diagnosis not present

## 2016-09-28 DIAGNOSIS — J9811 Atelectasis: Secondary | ICD-10-CM | POA: Diagnosis not present

## 2016-09-28 DIAGNOSIS — I739 Peripheral vascular disease, unspecified: Secondary | ICD-10-CM | POA: Diagnosis not present

## 2016-09-28 DIAGNOSIS — I7789 Other specified disorders of arteries and arterioles: Secondary | ICD-10-CM | POA: Diagnosis present

## 2016-09-28 DIAGNOSIS — I998 Other disorder of circulatory system: Secondary | ICD-10-CM | POA: Diagnosis not present

## 2016-09-28 DIAGNOSIS — M79642 Pain in left hand: Secondary | ICD-10-CM | POA: Diagnosis not present

## 2016-09-28 DIAGNOSIS — R200 Anesthesia of skin: Secondary | ICD-10-CM | POA: Diagnosis not present

## 2016-09-28 DIAGNOSIS — S1093XA Contusion of unspecified part of neck, initial encounter: Secondary | ICD-10-CM | POA: Diagnosis not present

## 2016-09-28 DIAGNOSIS — Z9889 Other specified postprocedural states: Secondary | ICD-10-CM

## 2016-09-28 HISTORY — PX: RIB RESECTION: SHX5077

## 2016-09-28 HISTORY — PX: PERIPHERAL VASCULAR INTERVENTION: CATH118257

## 2016-09-28 HISTORY — PX: I & D EXTREMITY: SHX5045

## 2016-09-28 HISTORY — PX: PATCH ANGIOPLASTY: SHX6230

## 2016-09-28 HISTORY — PX: EMBOLECTOMY: SHX44

## 2016-09-28 LAB — POCT ACTIVATED CLOTTING TIME: Activated Clotting Time: 169 seconds

## 2016-09-28 SURGERY — EMBOLECTOMY
Anesthesia: General | Site: Groin | Laterality: Left

## 2016-09-28 SURGERY — IRRIGATION AND DEBRIDEMENT EXTREMITY
Anesthesia: General | Site: Chest | Laterality: Left

## 2016-09-28 MED ORDER — IODIXANOL 320 MG/ML IV SOLN
INTRAVENOUS | Status: DC | PRN
  Administered 2016-09-28: 59.8 mL via INTRAVENOUS

## 2016-09-28 MED ORDER — CYANOCOBALAMIN 250 MCG PO TABS
250.0000 ug | ORAL_TABLET | Freq: Every day | ORAL | Status: DC
  Administered 2016-09-29: 250 ug via ORAL
  Filled 2016-09-28: qty 1

## 2016-09-28 MED ORDER — PHENYLEPHRINE HCL 10 MG/ML IJ SOLN
INTRAVENOUS | Status: DC | PRN
  Administered 2016-09-28: 25 ug/min via INTRAVENOUS

## 2016-09-28 MED ORDER — CLONAZEPAM 0.5 MG PO TABS: 0.5000 mg | ORAL_TABLET | Freq: Every day | ORAL | Status: DC | PRN

## 2016-09-28 MED ORDER — MIDAZOLAM HCL 5 MG/5ML IJ SOLN
INTRAMUSCULAR | Status: DC | PRN
  Administered 2016-09-28: 2 mg via INTRAVENOUS

## 2016-09-28 MED ORDER — SODIUM CHLORIDE 0.9 % IR SOLN
Status: DC | PRN
Start: 2016-09-28 — End: 2016-09-28
  Administered 2016-09-28: 1000 mL

## 2016-09-28 MED ORDER — SODIUM CHLORIDE 0.9 % IV SOLN
INTRAVENOUS | Status: DC | PRN
  Administered 2016-09-28: 500 mL

## 2016-09-28 MED ORDER — SODIUM CHLORIDE 0.45 % IV SOLN
INTRAVENOUS | Status: DC
  Administered 2016-09-28: 19:00:00 via INTRAVENOUS

## 2016-09-28 MED ORDER — NEOSTIGMINE METHYLSULFATE 10 MG/10ML IV SOLN
INTRAVENOUS | Status: DC | PRN
  Administered 2016-09-28: 3 mg via INTRAVENOUS

## 2016-09-28 MED ORDER — DEXTROSE 5 % IV SOLN
INTRAVENOUS | Status: AC
  Filled 2016-09-28: qty 1.5

## 2016-09-28 MED ORDER — DEXTROSE 5 % IV SOLN
1.5000 g | Freq: Two times a day (BID) | INTRAVENOUS | Status: AC
  Administered 2016-09-28 – 2016-09-29 (×2): 1.5 g via INTRAVENOUS
  Filled 2016-09-28 (×2): qty 1.5

## 2016-09-28 MED ORDER — PHENYLEPHRINE 40 MCG/ML (10ML) SYRINGE FOR IV PUSH (FOR BLOOD PRESSURE SUPPORT)
PREFILLED_SYRINGE | INTRAVENOUS | Status: AC
  Filled 2016-09-28: qty 10

## 2016-09-28 MED ORDER — CEFAZOLIN SODIUM 1 G IJ SOLR
INTRAMUSCULAR | Status: AC
  Filled 2016-09-28: qty 10

## 2016-09-28 MED ORDER — DEXAMETHASONE SODIUM PHOSPHATE 10 MG/ML IJ SOLN
INTRAMUSCULAR | Status: AC
  Filled 2016-09-28: qty 1

## 2016-09-28 MED ORDER — OXYCODONE HCL 5 MG PO TABS: 5.0000 mg | ORAL_TABLET | Freq: Once | ORAL | Status: DC | PRN

## 2016-09-28 MED ORDER — GUAIFENESIN-DM 100-10 MG/5ML PO SYRP: 15.0000 mL | ORAL_SOLUTION | ORAL | Status: DC | PRN

## 2016-09-28 MED ORDER — MORPHINE SULFATE (PF) 2 MG/ML IV SOLN
2.0000 mg | INTRAVENOUS | Status: DC | PRN
  Administered 2016-09-28 – 2016-09-29 (×5): 2 mg via INTRAVENOUS
  Filled 2016-09-28: qty 1
  Filled 2016-09-28: qty 2
  Filled 2016-09-28 (×3): qty 1

## 2016-09-28 MED ORDER — ONDANSETRON HCL 4 MG/2ML IJ SOLN
4.0000 mg | Freq: Four times a day (QID) | INTRAMUSCULAR | Status: DC | PRN
  Administered 2016-09-28: 4 mg via INTRAVENOUS
  Filled 2016-09-28: qty 2

## 2016-09-28 MED ORDER — PROPOFOL 10 MG/ML IV BOLUS
INTRAVENOUS | Status: DC | PRN
  Administered 2016-09-28: 180 mg via INTRAVENOUS

## 2016-09-28 MED ORDER — HYDROMORPHONE HCL 1 MG/ML IJ SOLN
INTRAMUSCULAR | Status: AC
  Administered 2016-09-28: 0.5 mg via INTRAVENOUS
  Filled 2016-09-28: qty 1

## 2016-09-28 MED ORDER — EPHEDRINE 5 MG/ML INJ
INTRAVENOUS | Status: AC
  Filled 2016-09-28: qty 10

## 2016-09-28 MED ORDER — LABETALOL HCL 5 MG/ML IV SOLN: 10.0000 mg | INTRAVENOUS | Status: DC | PRN

## 2016-09-28 MED ORDER — TRAMADOL HCL 50 MG PO TABS
50.0000 mg | ORAL_TABLET | Freq: Four times a day (QID) | ORAL | Status: DC | PRN
  Administered 2016-09-29 (×2): 100 mg via ORAL
  Filled 2016-09-28 (×2): qty 2

## 2016-09-28 MED ORDER — HYDROMORPHONE HCL 1 MG/ML IJ SOLN
INTRAMUSCULAR | Status: AC
  Filled 2016-09-28: qty 1

## 2016-09-28 MED ORDER — PHENYLEPHRINE HCL 10 MG/ML IJ SOLN
INTRAMUSCULAR | Status: DC | PRN
  Administered 2016-09-28: 120 ug via INTRAVENOUS

## 2016-09-28 MED ORDER — ARTIFICIAL TEARS OP OINT
TOPICAL_OINTMENT | OPHTHALMIC | Status: DC | PRN
  Administered 2016-09-28: 1 via OPHTHALMIC

## 2016-09-28 MED ORDER — ASPIRIN EC 81 MG PO TBEC
81.0000 mg | DELAYED_RELEASE_TABLET | Freq: Every day | ORAL | Status: DC
Start: 2016-09-29 — End: 2016-09-29
  Administered 2016-09-29: 81 mg via ORAL
  Filled 2016-09-28: qty 1

## 2016-09-28 MED ORDER — ONDANSETRON HCL 4 MG/2ML IJ SOLN
INTRAMUSCULAR | Status: DC | PRN
  Administered 2016-09-28: 4 mg via INTRAVENOUS

## 2016-09-28 MED ORDER — FENTANYL CITRATE (PF) 250 MCG/5ML IJ SOLN
INTRAMUSCULAR | Status: AC
  Filled 2016-09-28: qty 5

## 2016-09-28 MED ORDER — OXYCODONE HCL 5 MG/5ML PO SOLN: 5.0000 mg | Freq: Once | ORAL | Status: DC | PRN

## 2016-09-28 MED ORDER — HEPARIN SODIUM (PORCINE) 1000 UNIT/ML IJ SOLN
INTRAMUSCULAR | Status: DC | PRN
Start: 2016-09-28 — End: 2016-09-28
  Administered 2016-09-28: 5000 [IU] via INTRAVENOUS
  Administered 2016-09-28: 7000 [IU] via INTRAVENOUS

## 2016-09-28 MED ORDER — POTASSIUM CHLORIDE CRYS ER 20 MEQ PO TBCR
20.0000 meq | EXTENDED_RELEASE_TABLET | Freq: Every day | ORAL | Status: AC | PRN
  Administered 2016-09-29: 20 meq via ORAL
  Filled 2016-09-28: qty 1

## 2016-09-28 MED ORDER — ONDANSETRON HCL 4 MG/2ML IJ SOLN: 4.0000 mg | Freq: Four times a day (QID) | INTRAMUSCULAR | Status: DC | PRN

## 2016-09-28 MED ORDER — HYDROMORPHONE HCL 1 MG/ML IJ SOLN
INTRAMUSCULAR | Status: DC | PRN
  Administered 2016-09-28 (×2): 0.5 mg via INTRAVENOUS

## 2016-09-28 MED ORDER — LIDOCAINE HCL (CARDIAC) 20 MG/ML IV SOLN
INTRAVENOUS | Status: DC | PRN
  Administered 2016-09-28: 60 mg via INTRAVENOUS

## 2016-09-28 MED ORDER — MIDAZOLAM HCL 2 MG/2ML IJ SOLN
INTRAMUSCULAR | Status: AC
  Filled 2016-09-28: qty 2

## 2016-09-28 MED ORDER — ROCURONIUM BROMIDE 50 MG/5ML IV SOSY
PREFILLED_SYRINGE | INTRAVENOUS | Status: AC
  Filled 2016-09-28: qty 5

## 2016-09-28 MED ORDER — DEXAMETHASONE SODIUM PHOSPHATE 10 MG/ML IJ SOLN
INTRAMUSCULAR | Status: DC | PRN
Start: 2016-09-28 — End: 2016-09-28
  Administered 2016-09-28: 10 mg via INTRAVENOUS

## 2016-09-28 MED ORDER — DEXTROSE 5 % IV SOLN
1.5000 g | INTRAVENOUS | Status: AC
  Administered 2016-09-28: 1.5 g via INTRAVENOUS
  Administered 2016-09-28: 08:00:00 via INTRAVENOUS
  Administered 2016-09-28: 1.5 g via INTRAVENOUS

## 2016-09-28 MED ORDER — SCOPOLAMINE 1 MG/3DAYS TD PT72
MEDICATED_PATCH | TRANSDERMAL | Status: AC
  Filled 2016-09-28: qty 1

## 2016-09-28 MED ORDER — PROPOFOL 10 MG/ML IV BOLUS
INTRAVENOUS | Status: AC
  Filled 2016-09-28: qty 20

## 2016-09-28 MED ORDER — PHENYLEPHRINE HCL 10 MG/ML IJ SOLN
INTRAMUSCULAR | Status: DC | PRN
  Administered 2016-09-28: 80 ug via INTRAVENOUS

## 2016-09-28 MED ORDER — SUGAMMADEX SODIUM 200 MG/2ML IV SOLN
INTRAVENOUS | Status: DC | PRN
  Administered 2016-09-28: 140 mg via INTRAVENOUS

## 2016-09-28 MED ORDER — ROCURONIUM BROMIDE 100 MG/10ML IV SOLN
INTRAVENOUS | Status: DC | PRN
  Administered 2016-09-28: 30 mg via INTRAVENOUS

## 2016-09-28 MED ORDER — ACETAMINOPHEN 500 MG PO TABS
1000.0000 mg | ORAL_TABLET | Freq: Four times a day (QID) | ORAL | Status: DC | PRN
  Administered 2016-09-29 (×2): 1000 mg via ORAL
  Filled 2016-09-28 (×2): qty 2

## 2016-09-28 MED ORDER — PANTOPRAZOLE SODIUM 40 MG PO TBEC
40.0000 mg | DELAYED_RELEASE_TABLET | Freq: Every day | ORAL | Status: DC
  Administered 2016-09-29: 40 mg via ORAL
  Filled 2016-09-28: qty 1

## 2016-09-28 MED ORDER — SCOPOLAMINE 1 MG/3DAYS TD PT72
MEDICATED_PATCH | TRANSDERMAL | Status: DC | PRN
  Administered 2016-09-28: 1 via TRANSDERMAL

## 2016-09-28 MED ORDER — SODIUM CHLORIDE 0.9 % IV SOLN: INTRAVENOUS | Status: DC

## 2016-09-28 MED ORDER — FENTANYL CITRATE (PF) 100 MCG/2ML IJ SOLN
INTRAMUSCULAR | Status: DC | PRN
  Administered 2016-09-28 (×2): 50 ug via INTRAVENOUS
  Administered 2016-09-28: 100 ug via INTRAVENOUS

## 2016-09-28 MED ORDER — ONDANSETRON HCL 4 MG/2ML IJ SOLN
INTRAMUSCULAR | Status: AC
  Filled 2016-09-28: qty 2

## 2016-09-28 MED ORDER — SODIUM CHLORIDE 0.9 % IV SOLN: 500.0000 mL | Freq: Once | INTRAVENOUS | Status: DC | PRN

## 2016-09-28 MED ORDER — LIDOCAINE 2% (20 MG/ML) 5 ML SYRINGE
INTRAMUSCULAR | Status: AC
  Filled 2016-09-28: qty 5

## 2016-09-28 MED ORDER — LACTATED RINGERS IV SOLN
INTRAVENOUS | Status: DC | PRN
  Administered 2016-09-28 (×2): via INTRAVENOUS

## 2016-09-28 MED ORDER — PROMETHAZINE HCL 25 MG/ML IJ SOLN
12.5000 mg | Freq: Once | INTRAMUSCULAR | Status: AC
  Administered 2016-09-28: 12.5 mg via INTRAVENOUS

## 2016-09-28 MED ORDER — METOPROLOL TARTRATE 5 MG/5ML IV SOLN: 2.0000 mg | INTRAVENOUS | Status: DC | PRN

## 2016-09-28 MED ORDER — SUCCINYLCHOLINE CHLORIDE 20 MG/ML IJ SOLN
INTRAMUSCULAR | Status: DC | PRN
  Administered 2016-09-28: 100 mg via INTRAVENOUS

## 2016-09-28 MED ORDER — LACTATED RINGERS IV SOLN
INTRAVENOUS | Status: DC | PRN
  Administered 2016-09-28: 16:00:00 via INTRAVENOUS

## 2016-09-28 MED ORDER — FENTANYL CITRATE (PF) 100 MCG/2ML IJ SOLN: 25.0000 ug | INTRAMUSCULAR | Status: DC | PRN

## 2016-09-28 MED ORDER — CHLORHEXIDINE GLUCONATE CLOTH 2 % EX PADS: 6.0000 | MEDICATED_PAD | Freq: Once | CUTANEOUS | Status: DC

## 2016-09-28 MED ORDER — PHENOL 1.4 % MT LIQD: 1.0000 | OROMUCOSAL | Status: DC | PRN

## 2016-09-28 MED ORDER — PROPOFOL 10 MG/ML IV BOLUS
INTRAVENOUS | Status: DC | PRN
  Administered 2016-09-28: 150 mg via INTRAVENOUS

## 2016-09-28 MED ORDER — HYDROMORPHONE HCL 1 MG/ML IJ SOLN
0.2500 mg | INTRAMUSCULAR | Status: DC | PRN
  Administered 2016-09-28 (×3): 0.5 mg via INTRAVENOUS

## 2016-09-28 MED ORDER — PHENYLEPHRINE HCL 10 MG/ML IJ SOLN
INTRAVENOUS | Status: DC | PRN
  Administered 2016-09-28: 20 ug/min via INTRAVENOUS

## 2016-09-28 MED ORDER — HYDROMORPHONE HCL 1 MG/ML IJ SOLN
INTRAMUSCULAR | Status: AC
  Filled 2016-09-28: qty 0.5

## 2016-09-28 MED ORDER — 0.9 % SODIUM CHLORIDE (POUR BTL) OPTIME
TOPICAL | Status: DC | PRN
  Administered 2016-09-28: 1000 mL

## 2016-09-28 MED ORDER — FENTANYL CITRATE (PF) 100 MCG/2ML IJ SOLN
INTRAMUSCULAR | Status: DC | PRN
  Administered 2016-09-28 (×2): 50 ug via INTRAVENOUS
  Administered 2016-09-28 (×2): 100 ug via INTRAVENOUS
  Administered 2016-09-28: 150 ug via INTRAVENOUS
  Administered 2016-09-28: 50 ug via INTRAVENOUS

## 2016-09-28 MED ORDER — ALUM & MAG HYDROXIDE-SIMETH 200-200-20 MG/5ML PO SUSP: 15.0000 mL | ORAL | Status: DC | PRN

## 2016-09-28 MED ORDER — HEMOSTATIC AGENTS (NO CHARGE) OPTIME
TOPICAL | Status: DC | PRN
  Administered 2016-09-28: 1 via TOPICAL

## 2016-09-28 MED ORDER — HYDRALAZINE HCL 20 MG/ML IJ SOLN: 5.0000 mg | INTRAMUSCULAR | Status: DC | PRN

## 2016-09-28 MED ORDER — MAGNESIUM SULFATE 2 GM/50ML IV SOLN: 2.0000 g | Freq: Every day | INTRAVENOUS | Status: DC | PRN

## 2016-09-28 MED ORDER — SUCCINYLCHOLINE CHLORIDE 200 MG/10ML IV SOSY
PREFILLED_SYRINGE | INTRAVENOUS | Status: AC
  Filled 2016-09-28: qty 10

## 2016-09-28 MED ORDER — SUGAMMADEX SODIUM 200 MG/2ML IV SOLN
INTRAVENOUS | Status: AC
  Filled 2016-09-28: qty 2

## 2016-09-28 MED ORDER — CEFAZOLIN SODIUM 1 G IJ SOLR
INTRAMUSCULAR | Status: DC | PRN
  Administered 2016-09-28: 2 g via INTRAMUSCULAR

## 2016-09-28 MED ORDER — ROCURONIUM BROMIDE 100 MG/10ML IV SOLN
INTRAVENOUS | Status: DC | PRN
  Administered 2016-09-28: 20 mg via INTRAVENOUS
  Administered 2016-09-28: 50 mg via INTRAVENOUS

## 2016-09-28 MED ORDER — PROMETHAZINE HCL 25 MG/ML IJ SOLN: 12.5000 mg | Freq: Four times a day (QID) | INTRAMUSCULAR | Status: DC | PRN

## 2016-09-28 MED ORDER — DEXTROSE 5 % IV SOLN
1.5000 g | INTRAVENOUS | Status: DC
  Filled 2016-09-28: qty 1.5

## 2016-09-28 MED ORDER — DOCUSATE SODIUM 100 MG PO CAPS
100.0000 mg | ORAL_CAPSULE | Freq: Every day | ORAL | Status: DC
  Administered 2016-09-29: 100 mg via ORAL
  Filled 2016-09-28: qty 1

## 2016-09-28 MED ORDER — GLYCOPYRROLATE 0.2 MG/ML IJ SOLN
INTRAMUSCULAR | Status: DC | PRN
  Administered 2016-09-28: 0.3 mg via INTRAVENOUS

## 2016-09-28 MED ORDER — PROMETHAZINE HCL 25 MG/ML IJ SOLN
INTRAMUSCULAR | Status: AC
  Administered 2016-09-28: 12.5 mg via INTRAVENOUS
  Filled 2016-09-28: qty 1

## 2016-09-28 SURGICAL SUPPLY — 48 items
ADH SKN CLS APL DERMABOND .7 (GAUZE/BANDAGES/DRESSINGS) ×1
BANDAGE ESMARK 6X9 LF (GAUZE/BANDAGES/DRESSINGS) IMPLANT
BNDG CMPR 9X6 STRL LF SNTH (GAUZE/BANDAGES/DRESSINGS)
BNDG ESMARK 6X9 LF (GAUZE/BANDAGES/DRESSINGS)
CANISTER SUCT 3000ML PPV (MISCELLANEOUS) ×2 IMPLANT
CANNULA VESSEL 3MM 2 BLNT TIP (CANNULA) ×2 IMPLANT
COVER SURGICAL LIGHT HANDLE (MISCELLANEOUS) ×1 IMPLANT
CUFF TOURNIQUET SINGLE 18IN (TOURNIQUET CUFF) IMPLANT
CUFF TOURNIQUET SINGLE 24IN (TOURNIQUET CUFF) IMPLANT
CUFF TOURNIQUET SINGLE 34IN LL (TOURNIQUET CUFF) IMPLANT
CUFF TOURNIQUET SINGLE 44IN (TOURNIQUET CUFF) IMPLANT
DERMABOND ADVANCED (GAUZE/BANDAGES/DRESSINGS) ×1
DERMABOND ADVANCED .7 DNX12 (GAUZE/BANDAGES/DRESSINGS) IMPLANT
DRAIN JACKSON PRT FLT 10 (DRAIN) ×1 IMPLANT
DRAIN SNY 10X20 3/4 PERF (WOUND CARE) IMPLANT
DRAPE CHEST BREAST 15X10 FENES (DRAPES) ×1 IMPLANT
DRAPE ORTHO SPLIT 77X108 STRL (DRAPES)
DRAPE SURG ORHT 6 SPLT 77X108 (DRAPES) IMPLANT
ELECT BLADE 4.0 EZ CLEAN MEGAD (MISCELLANEOUS) ×2
ELECT REM PT RETURN 9FT ADLT (ELECTROSURGICAL) ×2
ELECTRODE BLDE 4.0 EZ CLN MEGD (MISCELLANEOUS) ×1 IMPLANT
ELECTRODE REM PT RTRN 9FT ADLT (ELECTROSURGICAL) ×1 IMPLANT
EVACUATOR SILICONE 100CC (DRAIN) ×2 IMPLANT
GAUZE SPONGE 4X4 16PLY XRAY LF (GAUZE/BANDAGES/DRESSINGS) ×2 IMPLANT
GLOVE BIO SURGEON STRL SZ7.5 (GLOVE) ×2 IMPLANT
GOWN STRL REUS W/ TWL LRG LVL3 (GOWN DISPOSABLE) ×3 IMPLANT
GOWN STRL REUS W/TWL LRG LVL3 (GOWN DISPOSABLE) ×6
KIT BASIN OR (CUSTOM PROCEDURE TRAY) ×2 IMPLANT
KIT ROOM TURNOVER OR (KITS) ×2 IMPLANT
NS IRRIG 1000ML POUR BTL (IV SOLUTION) ×2 IMPLANT
PACK GENERAL/GYN (CUSTOM PROCEDURE TRAY) ×2 IMPLANT
PACK UNIVERSAL I (CUSTOM PROCEDURE TRAY) IMPLANT
PAD ARMBOARD 7.5X6 YLW CONV (MISCELLANEOUS) ×4 IMPLANT
PADDING CAST COTTON 6X4 STRL (CAST SUPPLIES) IMPLANT
SPONGE GAUZE 4X4 12PLY STER LF (GAUZE/BANDAGES/DRESSINGS) ×2 IMPLANT
SPONGE SURGIFOAM ABS GEL 100 (HEMOSTASIS) IMPLANT
STAPLER VISISTAT 35W (STAPLE) IMPLANT
SUT BONE WAX W31G (SUTURE) ×2 IMPLANT
SUT ETHILON 3 0 PS 1 (SUTURE) ×1 IMPLANT
SUT PROLENE 5 0 C 1 24 (SUTURE) IMPLANT
SUT PROLENE 6 0 CC (SUTURE) ×1 IMPLANT
SUT VIC AB 2-0 CTX 36 (SUTURE) IMPLANT
SUT VIC AB 3-0 SH 27 (SUTURE) ×2
SUT VIC AB 3-0 SH 27X BRD (SUTURE) ×1 IMPLANT
SYR 20CC LL (SYRINGE) ×1 IMPLANT
TAPE CLOTH SURG 4X10 WHT LF (GAUZE/BANDAGES/DRESSINGS) ×2 IMPLANT
UNDERPAD 30X30 (UNDERPADS AND DIAPERS) ×2 IMPLANT
WATER STERILE IRR 1000ML POUR (IV SOLUTION) ×1 IMPLANT

## 2016-09-28 SURGICAL SUPPLY — 78 items
ADH SKN CLS APL DERMABOND .7 (GAUZE/BANDAGES/DRESSINGS) ×8
AGENT HMST SPONGE THK3/8 (HEMOSTASIS) ×4
BAG BANDED W/RUBBER/TAPE 36X54 (MISCELLANEOUS) ×2 IMPLANT
BAG EQP BAND 135X91 W/RBR TAPE (MISCELLANEOUS) ×8
BAG SNAP BAND KOVER 36X36 (MISCELLANEOUS) ×2 IMPLANT
BANDAGE ESMARK 6X9 LF (GAUZE/BANDAGES/DRESSINGS) IMPLANT
BNDG CMPR 9X6 STRL LF SNTH (GAUZE/BANDAGES/DRESSINGS)
BNDG ESMARK 6X9 LF (GAUZE/BANDAGES/DRESSINGS)
CANISTER SUCT 3000ML PPV (MISCELLANEOUS) ×5 IMPLANT
CANNULA VESSEL 3MM 2 BLNT TIP (CANNULA) ×2 IMPLANT
CATH ACCU-VU SIZ PIG 5F 100CM (CATHETERS) ×1 IMPLANT
CATH ANGIO 5F BER2 100CM (CATHETERS) ×1 IMPLANT
CATH ANGIO 5F BER2 65CM (CATHETERS) ×1 IMPLANT
CATH EMB 2FR 60CM (CATHETERS) ×2 IMPLANT
CATH EMB 3FR 80CM (CATHETERS) ×1 IMPLANT
CATH EMB 4FR 80CM (CATHETERS) IMPLANT
CATH EMB 5FR 80CM (CATHETERS) IMPLANT
CLIP TI MEDIUM 24 (CLIP) ×5 IMPLANT
CLIP TI WIDE RED SMALL 24 (CLIP) ×5 IMPLANT
CONT SPEC 4OZ CLIKSEAL STRL BL (MISCELLANEOUS) ×1 IMPLANT
COVER BACK TABLE 60X90IN (DRAPES) ×2 IMPLANT
COVER DOME SNAP 22 D (MISCELLANEOUS) ×1 IMPLANT
COVER SURGICAL LIGHT HANDLE (MISCELLANEOUS) ×2 IMPLANT
CUFF TOURNIQUET SINGLE 24IN (TOURNIQUET CUFF) IMPLANT
CUFF TOURNIQUET SINGLE 34IN LL (TOURNIQUET CUFF) IMPLANT
CUFF TOURNIQUET SINGLE 44IN (TOURNIQUET CUFF) IMPLANT
DECANTER SPIKE VIAL GLASS SM (MISCELLANEOUS) IMPLANT
DERMABOND ADVANCED (GAUZE/BANDAGES/DRESSINGS) ×2
DERMABOND ADVANCED .7 DNX12 (GAUZE/BANDAGES/DRESSINGS) IMPLANT
DRAIN SNY 10X20 3/4 PERF (WOUND CARE) IMPLANT
DRAPE FEMORAL ANGIO 80X135IN (DRAPES) ×2 IMPLANT
DRAPE X-RAY CASS 24X20 (DRAPES) IMPLANT
DRSG TEGADERM 4X4.75 (GAUZE/BANDAGES/DRESSINGS) ×1 IMPLANT
ELECT REM PT RETURN 9FT ADLT (ELECTROSURGICAL) ×5
ELECTRODE REM PT RTRN 9FT ADLT (ELECTROSURGICAL) ×4 IMPLANT
EVACUATOR SILICONE 100CC (DRAIN) IMPLANT
GAUZE SPONGE 4X4 16PLY XRAY LF (GAUZE/BANDAGES/DRESSINGS) ×2 IMPLANT
GLOVE BIO SURGEON STRL SZ7.5 (GLOVE) ×5 IMPLANT
GOWN STRL REUS W/ TWL LRG LVL3 (GOWN DISPOSABLE) ×12 IMPLANT
GOWN STRL REUS W/TWL LRG LVL3 (GOWN DISPOSABLE) ×15
HEMOSTAT SPONGE AVITENE ULTRA (HEMOSTASIS) ×2 IMPLANT
KIT BASIN OR (CUSTOM PROCEDURE TRAY) ×5 IMPLANT
KIT ENCORE 26 ADVANTAGE (KITS) ×2 IMPLANT
KIT ROOM TURNOVER OR (KITS) ×5 IMPLANT
LOOP VESSEL MINI RED (MISCELLANEOUS) ×2 IMPLANT
NDL PERC 18GX7CM (NEEDLE) IMPLANT
NEEDLE PERC 18GX7CM (NEEDLE) ×5 IMPLANT
NS IRRIG 1000ML POUR BTL (IV SOLUTION) ×10 IMPLANT
PACK PERIPHERAL VASCULAR (CUSTOM PROCEDURE TRAY) ×5 IMPLANT
PAD ARMBOARD 7.5X6 YLW CONV (MISCELLANEOUS) ×10 IMPLANT
PADDING CAST COTTON 6X4 STRL (CAST SUPPLIES) IMPLANT
SET COLLECT BLD 21X3/4 12 (NEEDLE) IMPLANT
SHEATH BRITE TIP 7FRX11 (SHEATH) ×1 IMPLANT
SPONGE INTESTINAL PEANUT (DISPOSABLE) ×9 IMPLANT
SPONGE SURGIFOAM ABS GEL 100 (HEMOSTASIS) IMPLANT
STAPLER VISISTAT 35W (STAPLE) IMPLANT
STENT OMNILINK ELITE 6X29X135 (Stent) ×1 IMPLANT
STOCKINETTE 6  STRL (DRAPES) ×1
STOCKINETTE 6 STRL (DRAPES) ×1 IMPLANT
STOPCOCK 4 WAY LG BORE MALE ST (IV SETS) IMPLANT
STOPCOCK MORSE 400PSI 3WAY (MISCELLANEOUS) ×1 IMPLANT
SUT PROLENE 5 0 C 1 24 (SUTURE) ×5 IMPLANT
SUT PROLENE 6 0 CC (SUTURE) ×7 IMPLANT
SUT PROLENE 7 0 BV 1 (SUTURE) ×12 IMPLANT
SUT SILK 3 0 (SUTURE) ×5
SUT SILK 3-0 18XBRD TIE 12 (SUTURE) ×3 IMPLANT
SUT VIC AB 2-0 CTX 36 (SUTURE) ×5 IMPLANT
SUT VIC AB 3-0 SH 27 (SUTURE) ×15
SUT VIC AB 3-0 SH 27X BRD (SUTURE) ×6 IMPLANT
SUT VIC AB 4-0 PS2 27 (SUTURE) ×1 IMPLANT
SYR 3ML LL SCALE MARK (SYRINGE) ×5 IMPLANT
TRAY FOLEY W/METER SILVER 16FR (SET/KITS/TRAYS/PACK) ×5 IMPLANT
TUBING EXTENTION W/L.L. (IV SETS) IMPLANT
TUBING HIGH PRESSURE 120CM (CONNECTOR) ×2 IMPLANT
UNDERPAD 30X30 (UNDERPADS AND DIAPERS) ×5 IMPLANT
WATER STERILE IRR 1000ML POUR (IV SOLUTION) ×5 IMPLANT
WIRE BENTSON .035X145CM (WIRE) ×2 IMPLANT
WIRE HI TORQ VERSACORE J 260CM (WIRE) ×2 IMPLANT

## 2016-09-28 NOTE — Transfer of Care (Signed)
Immediate Anesthesia Transfer of Care Note  Patient: Michelle Villa  Procedure(s) Performed: Procedure(s): Left Brachial, radial, ulnar and intraosseous artery thrombectomy (Left) PATCH ANGIOPLASTY BRACHIAL ARTERY USING VIEN HARVESTED FROM PATIENT (Left) FIRST RIB RESECTION (Left) LEFT SUBCLAVIAN STENT (Left) ARCH AORTOGRAM  Patient Location: PACU  Anesthesia Type:General  Level of Consciousness: awake, oriented, sedated, patient cooperative and responds to stimulation  Airway & Oxygen Therapy: Patient Spontanous Breathing and Patient connected to nasal cannula oxygen  Post-op Assessment: Report given to RN, Post -op Vital signs reviewed and stable, Patient moving all extremities and Patient moving all extremities X 4  Post vital signs: Reviewed and stable  Last Vitals:  Vitals:   09/28/16 0615  BP: 119/75  Pulse: 87  Resp: 18  Temp: 37.2 C    Last Pain:  Vitals:   09/28/16 0615  TempSrc: Oral         Complications: No apparent anesthesia complications

## 2016-09-28 NOTE — Op Note (Signed)
Procedure: 1 left brachial radial interosseous arterial thrombectomy and vein patch angioplasty left brachial artery  #2 arch aortogram with left upper extremity runoff primary stent left subclavian artery (6 x 29 balloon-expandable)  Preoperative diagnosis: Arterial embolus secondary to thoracic outlet syndrome  Postoperative diagnosis: Same  Anesthesia: Gen.  Assistant: Silva Bandy PA-C  Operative findings: 1.small brachial radial ulnar and interosseous arteries with the distal arteries excepting only a 2 Fogarty catheter  #2 right femoral puncture using ultrasound guidance  Operative details:  After obtaining informed consent, the patient was taken to the operating room.  After induction of general anesthesia and endotracheal intubation, the patient's entire neck chest and left arm were prepped and draped in the usual sterile fashion. At this point Dr. Gwenlyn Saran performed a left first rib resection through a supraclavicular incision. This is dictated as a separate operative note. After the neck had been closed, I proceeded to make a longitudinal incision near the antecubital crease of the left arm. The incision was carried down through subcutaneous tissues down level of brachial artery. The brachial artery was quite small about 2 mm in diameter. Dissection was carried down the arm and the radial ulnar and interosseous arteries were all dissected free circumferentially and vessel loops placed around these. As a poor quality pulse in the brachial artery but it was present. There was no radial ulnar or interosseous pulse. The runoff vessels were quite small about 1-1-1/2 mm in diameter. At this point the patient was given 7000 units of intravenous heparin. The artery was controlled proximally with a fine bulldog clamp and the distal runoff vessels also controlled with fine bulldog clamps. A longitudinal opening was made in the brachial artery. It had a smooth surface in appearance with no evidence of  embolic material. Then proceeded to attempt to pass a 3 Fogarty down the vessels. Really. 2 a #2 Fogarty and was able to get this down at least to the full length of the wrist through the ulnar and radial arteries. Osseous artery was able to pass the catheter to the mid forearm. Multiple passes were made with no retrieval of any significant embolic material. There was good backbleeding with almost pulsatile backbleeding from each of these vessels. #3 Fogarty catheter was passed proximally up the brachial artery on 2 passes and there was excellent arterial pulsatile inflow.   At this point an adjacent deep brachial vein circumferentially and small side branches ligated and divided between silk ties. It was ligated proximally and distally reversed and opened longitudinally and sewn on as a patch angioplasty using a running 7-0 Prolene suture. Just prior completion of the anastomosis it was forebled backbled and thoroughly flushed. Anastomosis was secured clamps released there was good pulsatile flow in the brachial artery. The patient had radial Doppler flow. There was obstructive Sounding flow in the origins of all 3 runoff vessels but these were patent. Only I had considered placing a retrograde subclavian stent through the brachial artery but due to the small size of the artery I decided to take a femoral approach instead. The subcutaneous tissues of the incision were reapproximated using a running 3-0 Vicryl suture. The skin was closed with 4-0 Vicryl subcuticular stitch. Dermabond was applied.  At this point sterile prep and drape was performed of the right groin. Ultrasound was used to identify the right common femoral artery. An introducer needle was then used to cannulate the artery without difficulty. An 27 Bentson wire was threaded up in the abdominal aorta under fluoroscopic  guidance. A 7 French short sheath was then placed over the guidewire in the right common femoral artery and thoroughly flushed with  heparin saline. A 5 French pig Catheter was placed over the guidewire and these were advanced as a unit up in the ascending aorta. An arch aortogram was then obtain a 60 LAO projection. This showed normal arch anatomy with patent innominate left common and left subclavian arteries with a widely patent left vertebral artery. The left first rib was noted to be absent.  Was a 70% stenosis of the distal left subclavian artery adjacent to where the left first rib had previously been located. Bentson wire. A 5 [place] Berenstein 2 catheter was used to selectively catheterize the left subclavian artery and then advanced the wire distally into the proximal brachial artery. Was noted at this point that the sheath was not going to reach the subclavian artery. I pulled the guidewire back the patient was given 5000 units of intravenous heparin. The Berenstein catheter was pulled back slightly and a contrast angiogram again performed for roadmapping purposes. I then was able to manipulate an 035 versacore wire 260 length out into the brachial artery.  A 6 x 29 balloon-expandable stent was then advanced up and centered on the lesion and inflated to 12 atm for 30 seconds. There was a good waist on the balloon. This resolved completely after inflation. Completion arteriogram was then performed to the [facility] catheter showing the subclavian was widely patent without evidence of dissection or extravasation, left axillary and brachial arteries were widely patent. There was some brief interruption of flow in the proximal radial artery as noted on her preoperative arteriogram but there was now three-vessel runoff to the left hand with filling of all digital vessels.  The Berenstein Catheter and guidewire were removed and a 7 French sheath thoroughly flushed with heparin saline. The patient was taken taken to the recovery room after extubation to have the sheath pulled in the recovery area.  The patient tolerated the procedure well  and there were no complications. Sponge and needle count was correct at the end of the case.  Ruta Hinds, MD [facility] [place] Office: [phone] Pager: [phone]

## 2016-09-28 NOTE — Anesthesia Procedure Notes (Signed)
Procedure Name: Intubation Date/Time: 09/28/2016 7:43 AM Performed by: Wray KearnsFOLEY, Queena Monrreal A Pre-anesthesia Checklist: Patient identified Patient Re-evaluated:Patient Re-evaluated prior to inductionOxygen Delivery Method: Circle system utilized Preoxygenation: Pre-oxygenation with 100% oxygen Intubation Type: IV induction Ventilation: Mask ventilation without difficulty Laryngoscope Size: Miller and 2 Grade View: Grade I Tube type: Oral Tube size: 7.0 mm Number of attempts: 1 Airway Equipment and Method: Patient positioned with wedge pillow and Stylet Placement Confirmation: ETT inserted through vocal cords under direct vision,  positive ETCO2 and breath sounds checked- equal and bilateral Secured at: 21 cm Tube secured with: Tape Dental Injury: Injury to lip  Comments: Lip had blood prior to induction; Dr. Maple HudsonMoser aware.

## 2016-09-28 NOTE — Anesthesia Preprocedure Evaluation (Signed)
Anesthesia Evaluation  Patient identified by MRN, date of birth, ID band Patient awake    Reviewed: Allergy & Precautions, H&P , NPO status , Patient's Chart, lab work & pertinent test results  Airway Mallampati: II   Neck ROM: limited   Comment: Swelling in left lower neck/supraclavicular region. Dental   Pulmonary neg pulmonary ROS,    breath sounds clear to auscultation       Cardiovascular + Peripheral Vascular Disease   Rhythm:regular Rate:Normal     Neuro/Psych PSYCHIATRIC DISORDERS Anxiety    GI/Hepatic   Endo/Other    Renal/GU      Musculoskeletal   Abdominal   Peds  Hematology   Anesthesia Other Findings   Reproductive/Obstetrics                             Anesthesia Physical Anesthesia Plan  ASA: II and emergent  Anesthesia Plan: General   Post-op Pain Management:    Induction: Intravenous  Airway Management Planned: Oral ETT and Video Laryngoscope Planned  Additional Equipment:   Intra-op Plan:   Post-operative Plan: Extubation in OR  Informed Consent: I have reviewed the patients History and Physical, chart, labs and discussed the procedure including the risks, benefits and alternatives for the proposed anesthesia with the patient or authorized representative who has indicated his/her understanding and acceptance.     Plan Discussed with: CRNA, Anesthesiologist and Surgeon  Anesthesia Plan Comments:         Anesthesia Quick Evaluation

## 2016-09-28 NOTE — Anesthesia Procedure Notes (Signed)
Procedure Name: Intubation Date/Time: 09/28/2016 4:06 PM Performed by: Rosiland OzMEYERS, Jacarri Gesner Pre-anesthesia Checklist: Patient identified, Emergency Drugs available, Suction available, Patient being monitored and Timeout performed Patient Re-evaluated:Patient Re-evaluated prior to inductionOxygen Delivery Method: Circle system utilized Preoxygenation: Pre-oxygenation with 100% oxygen Intubation Type: IV induction Ventilation: Mask ventilation without difficulty Laryngoscope Size: Glidescope and 3 Grade View: Grade I Tube type: Oral Tube size: 7.0 mm Number of attempts: 1 Airway Equipment and Method: Video-laryngoscopy Placement Confirmation: ETT inserted through vocal cords under direct vision,  positive ETCO2 and breath sounds checked- equal and bilateral Secured at: 21 cm Tube secured with: Tape Dental Injury: Teeth and Oropharynx as per pre-operative assessment

## 2016-09-28 NOTE — Anesthesia Preprocedure Evaluation (Signed)
Anesthesia Evaluation  Patient identified by MRN, date of birth, ID band Patient awake    Reviewed: Allergy & Precautions, NPO status , Patient's Chart, lab work & pertinent test results  History of Anesthesia Complications Negative for: history of anesthetic complications  Airway Mallampati: II  TM Distance: >3 FB Neck ROM: Full    Dental  (+) Teeth Intact,    Pulmonary neg pulmonary ROS,    breath sounds clear to auscultation       Cardiovascular  Rhythm:Regular     Neuro/Psych PSYCHIATRIC DISORDERS Anxiety negative neurological ROS     GI/Hepatic negative GI ROS, Neg liver ROS,   Endo/Other  negative endocrine ROS  Renal/GU negative Renal ROS     Musculoskeletal negative musculoskeletal ROS (+)   Abdominal   Peds  Hematology negative hematology ROS (+)   Anesthesia Other Findings   Reproductive/Obstetrics                             Anesthesia Physical Anesthesia Plan  ASA: I  Anesthesia Plan: General   Post-op Pain Management:    Induction: Intravenous  Airway Management Planned: Oral ETT  Additional Equipment: None  Intra-op Plan:   Post-operative Plan: Extubation in OR  Informed Consent: I have reviewed the patients History and Physical, chart, labs and discussed the procedure including the risks, benefits and alternatives for the proposed anesthesia with the patient or authorized representative who has indicated his/her understanding and acceptance.   Dental advisory given  Plan Discussed with: CRNA and Surgeon  Anesthesia Plan Comments:         Anesthesia Quick Evaluation

## 2016-09-28 NOTE — Interval H&P Note (Signed)
History and Physical Interval Note:  09/28/2016 7:23 AM  Michelle Villa  has presented today for surgery, with the diagnosis of Thoracic outlet syndrome M47.014; Left hand pain M79.642; Left hand numbness R20.2  The various methods of treatment have been discussed with the patient and family. After consideration of risks, benefits and other options for treatment, the patient has consented to  Procedure(s): BRACHIAL EMBOLECTOMY (Left) SUBCLAVIAN PATCH ANGIOPLASTY / STENT (Left) FIRST RIB RESECTION (Left) as a surgical intervention .  The patient's history has been reviewed, patient examined, no change in status, stable for surgery.  I have reviewed the patient's chart and labs.  Questions were answered to the patient's satisfaction.     Fabienne BrunsFields, Bernal Luhman

## 2016-09-28 NOTE — H&P (View-Only) (Signed)
Referring Physician: Dr Lucia Gaskins   Patient name: Michelle Villa     MRN: [id number]        DOB: 07/25/1985          Sex: female   REASON FOR CONSULT: Left hand pain   HPI: Michelle Villa is a 31 y.o. female, with a history of left shoulder and hand pain for about 4 months. The patient denies any trauma. She states that all of the symptoms have improved slightly since November. She is currently getting physical therapy. She was seen by neurology for evaluation of this. She had a negative EMG and cervical spine MRI. 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 The patient denies any family history of early atherosclerosis. She denies any prior history of IV drug abuse. She had been told in the past that she had Raynaud's. However, her symptoms always been unilateral on the left side. Her primary complaint is pain in digits 1, 2 and 3 of the left hand with numbness. She also occasionally has pain extending from the midforearm down to the hand. She was placed on aspirin recently.  She had an upper extremity arteriogram last week which showed a 70% narrowing of her left subclavian artery at the thoracic outlet between the clavicle and first rib. This also showed multivessel occlusion below the left brachial artery with primary runoff via the interosseous artery and reconstitution of the distal radial artery. She was placed on Plavix. She returns to the office today after recent CT Angio to make sure she did not have aneurysmal development surrounding the artery.       Past Medical History:  Diagnosis Date  . Raynaud phenomenon      Past Surgical History:  Procedure Laterality Date  . WISDOM TOOTH EXTRACTION               Family History  Problem Relation Age of Onset  . Hypertension Father    . Hyperlipidemia Father    . Neuropathy Neg Hx        SOCIAL HISTORY: Social History         Social History  . Marital  status: Single      Spouse name: N/A  . Number of children: N/A  . Years of education: N/A        Occupational History  . waitress           Social History Main Topics  . Smoking status: Never Smoker  . Smokeless tobacco: Never Used  . Alcohol use 4.2 oz/week      7 Standard drinks or equivalent per week  . Drug use: Yes      Types: Marijuana  . Sexual activity: Not on file        Other Topics Concern  . Not on file       Social History Narrative  . No narrative on file      No Known Allergies         Current Outpatient Prescriptions  Medication Sig Dispense Refill  . aspirin EC 81 MG tablet Take 81 mg by mouth daily.      . Cyanocobalamin (VITAMIN B-12) 3000 MCG SUBL Place 3,000 mcg under the tongue daily.       . drospirenone-ethinyl estradiol (YASMIN,ZARAH,SYEDA) 3-0.03 MG tablet Take 1 tablet by mouth daily.      . Multiple Vitamins-Minerals (ECHINACEA ACZ PO) Take 1 tablet by  mouth daily.       . Multiple Vitamins-Minerals (MULTI COMPLETE PO) Take 1 tablet by mouth daily.       . traMADol (ULTRAM) 50 MG tablet Take 50-100 mg by mouth 2 (two) times daily as needed for moderate pain.       Marland Kitchen. OVER THE COUNTER MEDICATION Take 1 tablet by mouth at bedtime as needed (sleep). Passion Flower otc sleep aid        No current facility-administered medications for this visit.       ROS:    General:  No weight loss, Fever, chills   HEENT: No recent headaches, no nasal bleeding, no visual changes, no sore throat   Neurologic: No dizziness, blackouts, seizures. No recent symptoms of stroke or mini- stroke. No recent episodes of slurred speech, or temporary blindness.   Cardiac: No recent episodes of chest pain/pressure, no shortness of breath at rest.  No shortness of breath with exertion.  Denies history of atrial fibrillation or irregular heartbeat   Vascular: No history of rest pain in feet.  No history of claudication.  No history of non-healing ulcer, No history of  DVT    Pulmonary: No home oxygen, no productive cough, no hemoptysis,  No asthma or wheezing   Musculoskeletal:  [ ]  Arthritis, [ ]  Low back pain,  [ ]  Joint pain   Hematologic:No history of hypercoagulable state.  No history of easy bleeding.  No history of anemia   Gastrointestinal: No hematochezia or melena,  No gastroesophageal reflux, no trouble swallowing   Urinary: [ ]  chronic Kidney disease, [ ]  on HD - [ ]  MWF or [ ]  TTHS, [ ]  Burning with urination, [ ]  Frequent urination, [ ]  Difficulty urinating;    Skin: No rashes   Psychological: No history of anxiety,  No history of depression     Physical Examination    Vitals:   09/17/16 1210  BP: 129/82  Pulse: 91  Resp: 20  Temp: 98.5 F (36.9 C)  TempSrc: Oral  SpO2: 100%  Weight: 145 lb 6.4 oz (66 kg)  Height: 5\' 3"  (1.6 m)     General:  Alert and oriented, no acute distress HEENT: Normal Cardiac: Regular Rate and Rhythm Abdomen: Soft, non-tender, non-distended, 1 cm mass right groin pulsatile some ecchymosis around her puncture site Skin: No rash or ulcer, left hand slightly cooler than the right Extremity Pulses:  2+ radial, brachial right side 2+ left brachial absent radial, 2+ femoral, dorsalis pedis, posterior tibial pulses with the exception of left upper extremity, she has an absent left radial pulse absent ulnar pulse  Musculoskeletal: No deformity or edema      Neurologic: Upper and lower extremity motor 5/5 and symmetric   DATA:  CT angiogram findings as listed above     ASSESSMENT:   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 Pulsatile mass right groin Will rule out pseudoaneurysm today.     PLAN:   first rib resection possible left subclavian artery stenting or patch angioplasty of left subclavian artery left brachial radial and ulnar thrombectomy scheduled for 09/28/2016. The patient will need to be out of  work for at least a week after the procedure. She will be an hospital for at least one day after the procedure. She will not be able to do any heavy lifting nothing greater than 5 pounds for 4-6 weeks.    Ultrasound  of right groin today showed no pseudoaneurysm   Fabienne Bruns, MD [facility] [place] Office: [phone] Pager: [phone]

## 2016-09-28 NOTE — Progress Notes (Signed)
Pt with slowly expanding mass left supraclavicular area.  No airway compromise currently.  No hypotension probably represents hematoma or less likely lymphatics.  Will return to OR for hematoma evacuation.  Discussed with pt  Michelle Brunsharles Marzell Allemand, MD [facility] [place] Office: [phone] Pager: [phone]

## 2016-09-28 NOTE — Op Note (Signed)
Procedure: Evacuation of hematoma left neck  Reoperative diagnosis: Hematoma left neck  Postoperative diagnosis: Same  Anesthesia: Gen.  Assistant: Bo McclintockBrandon Kane M.D., [facility] HospitalMaureen Collins PA-C  Operative findings: Diffuse oozing from cortex of the posterior portion first rib. 10 flat Jackson-Pratt drain left neck  Operative details: After informed consent, the patient was taken to the operating room. The patient was placed in supine position operating table. After induction of general anesthesia and endotracheal intubation, the patient's entire left neck and chest were prepped and draped in usual sterile fashion. Just in left supraclavicular incision was reopened carried down through the subcutaneous tissues and a hematoma was encountered under pressure. All this was thoroughly evacuated. There was a small amount of skin edge oozing which was controlled with cautery. There is no obvious visible vessel bleeding. I was able to find some diffuse oozing from the posterior portion of the first rib from the cortex. This was controlled with some bone wax some cautery and direct pressure. At this point there was adequate hemostasis. A 10 flat Jackson-Pratt drain was placed down into the space where the oozing was occurring to decrease the dead space in this portion of the neck. This was brought out through a separate stab laterally and sutured to the skin with nylon stitch. The wound was thoroughly irrigated with normal saline solution. The platysma was reapproximated using a running 3-0 Vicryl suture. Skin was closed with a 4-0 Vicryl subcuticular stitch. Dermabond was applied. The patient tolerated the procedure well and there were no complications. Instrument sponge and needle count was correct at the end of the case.  Fabienne Brunsharles Arely Tinner, MD [facility] [place] Office: [phone] Pager: [phone]

## 2016-09-28 NOTE — Transfer of Care (Signed)
Immediate Anesthesia Transfer of Care Note  Patient: Michelle Villa  Procedure(s) Performed: Procedure(s): EVACUATION HEMATOMA NECK (Left)  Patient Location: PACU  Anesthesia Type:General  Level of Consciousness: awake, alert , oriented and patient cooperative  Airway & Oxygen Therapy: Patient Spontanous Breathing  Post-op Assessment: Report given to RN and Post -op Vital signs reviewed and stable  Post vital signs: Reviewed and stable  Last Vitals:  Vitals:   09/28/16 1543 09/28/16 1726  BP: (!) 97/58 114/68  Pulse: (!) 114 (!) 107  Resp: (!) 23 17  Temp:  36.7 C    Last Pain:  Vitals:   09/28/16 1530  TempSrc:   PainSc: 3          Complications: No apparent anesthesia complications

## 2016-09-28 NOTE — Op Note (Signed)
    Patient name: Michelle Villa MRN: [id number] DOB: [birth year] Sex: female  09/28/2016 Pre-operative Diagnosis: arterial thoracic outlet syndrome Post-operative diagnosis:  Same Surgeon:  Apolinar JunesBrandon C. Randie Heinzain, MD Assistants: Fabienne Brunsharles Fields, MD  Karsten RoKim Trinh, [place] Procedure Performed:  Left 1st rib resection  Indications:  31 year old patient with a history of embolism to her left upper extremity. After undergoing ongoing workup she was noted to have arterial thoracic outlet syndrome and is indicated for left first rib resection.  Findings: First rib was removed en bloc. The subclavian artery and brachial plexus were normal appearing grossly. The long thoracic nerve was identified and protected throughout its course. There was somewhat long C7 transverse process that was not causing issue with her subclavian artery. There was a present scalene minimus muscle was also taken.   Procedure:  The patient was identified in the holding area and taken to the operating room where she is placed supine on the operating table general anesthesia was induced she was sterilely prepped and draped in the left neck and chest in fashion for a first rib resection timeout called. Prior to this antibiotic were administered. We made a transverse incision one finger breath above the left clavicle sending from the sterile cleidomastoid to the trapezius muscle. This was extended down through the platysma muscle we identified the fat pad. Fat pad was mobilized laterally and cephalad with electrocautery and clips until identified the IJ and subclavian vein confluence. Deep to this we identified our phrenic nerve overlying our anterior scalene muscle. We dissected our anterior scalene bluntly off the muscle and then divided the muscle protecting the nerve throughout its course. There were also fibers consistent with scalene minimus muscle these were divided as well. This time we could identify our first rib as well as our subclavian  artery. We then bluntly mobilize her subclavian artery brachial plexus moving laterally. Her brachial plexus was then mobilized anteriorly as well as identified the first rib posteriorly. We initially identified our C7 transverse process which was quite large but was not affecting her thoracic outlet. We then identified our first rib deeper as well as the attachments of the middle scalene muscle. Long thoracic nerve was identified protruding through this muscle the posterior aspect. The muscle was then divided using electrocautery off of the first rib. We then used a periosteal elevator as well as blunt dissection to mobilize our rib from surrounding intercostal muscles as well as the lung pleura below. We then moved anteriorly mobilizing our rib throughout its course. A rib cutter was then used to divide the rib posteriorly. In a similar fashion we protected the lung anteriorly and divided the rib with the cutter. We then used the periosteal elevator to remove remaining intercostal muscle and passed our rib off the field and a single piece measuring 5 cm. We then obtained hemostasis throughout our wound. The fat that was allowed to fall back into place and then we closed the platysma with 3-0 Vicryl suture and our skin with 4-0 Monocryl suture. Case was then turned over to Dr. Darrick PennaFields entirely for the completion of the arm portion which will be dictated separately.  Blood loss: 50cc  [name], MD [facility] [place] Office: [phone] Pager: [phone]

## 2016-09-28 NOTE — OR Nursing (Signed)
ACT 169 at 1345

## 2016-09-29 ENCOUNTER — Encounter (HOSPITAL_COMMUNITY): Payer: Self-pay | Admitting: Vascular Surgery

## 2016-09-29 LAB — BASIC METABOLIC PANEL
Anion gap: 7 (ref 5–15)
BUN: 10 mg/dL (ref 6–20)
CO2: 26 mmol/L (ref 22–32)
Calcium: 8.3 mg/dL — ABNORMAL LOW (ref 8.9–10.3)
Chloride: 108 mmol/L (ref 101–111)
Creatinine, Ser: 0.84 mg/dL (ref 0.44–1.00)
GFR calc Af Amer: >60 mL/min (ref >60)
GFR calc non Af Amer: >60 mL/min (ref >60)
Glucose, Bld: 98 mg/dL (ref 65–99)
Potassium: 3.4 mmol/L — ABNORMAL LOW (ref 3.5–5.1)
Sodium: 141 mmol/L (ref 135–145)

## 2016-09-29 LAB — CBC
HCT: 29.6 % — ABNORMAL LOW (ref 36.0–46.0)
Hemoglobin: 10.1 g/dL — ABNORMAL LOW (ref 12.0–15.0)
MCH: 33.7 pg (ref 26.0–34.0)
MCHC: 34.1 g/dL (ref 30.0–36.0)
MCV: 98.7 fL (ref 78.0–100.0)
Platelets: 179 10*3/uL (ref 150–400)
RBC: 3 MIL/uL — ABNORMAL LOW (ref 3.87–5.11)
RDW: 12.3 % (ref 11.5–15.5)
WBC: 7.5 10*3/uL (ref 4.0–10.5)

## 2016-09-29 MED ORDER — TRAMADOL HCL 50 MG PO TABS: 50.0000 mg | ORAL_TABLET | Freq: Four times a day (QID) | ORAL | 0 refills | Status: DC | PRN

## 2016-09-29 MED ORDER — METOPROLOL TARTRATE 5 MG/5ML IV SOLN
2.0000 mg | INTRAVENOUS | Status: DC | PRN
Start: 2016-09-29 — End: 2016-09-29

## 2016-09-29 MED ORDER — LABETALOL HCL 5 MG/ML IV SOLN: 10.0000 mg | INTRAVENOUS | Status: DC | PRN

## 2016-09-29 MED ORDER — PHENOL 1.4 % MT LIQD: 1.0000 | OROMUCOSAL | Status: DC | PRN

## 2016-09-29 MED ORDER — GUAIFENESIN-DM 100-10 MG/5ML PO SYRP: 15.0000 mL | ORAL_SOLUTION | ORAL | Status: DC | PRN

## 2016-09-29 MED ORDER — HYDRALAZINE HCL 20 MG/ML IJ SOLN: 5.0000 mg | INTRAMUSCULAR | Status: DC | PRN

## 2016-09-29 MED ORDER — ONDANSETRON HCL 4 MG/2ML IJ SOLN: 4.0000 mg | Freq: Four times a day (QID) | INTRAMUSCULAR | Status: DC | PRN

## 2016-09-29 MED ORDER — ASPIRIN 81 MG PO CHEW: 81.0000 mg | CHEWABLE_TABLET | Freq: Every day | ORAL | Status: DC

## 2016-09-29 NOTE — Anesthesia Postprocedure Evaluation (Addendum)
Anesthesia Post Note  Patient: Michelle Villa  Procedure(s) Performed: Procedure(s) (LRB): Left Brachial, radial, ulnar and intraosseous artery thrombectomy (Left) PATCH ANGIOPLASTY BRACHIAL ARTERY USING VIEN HARVESTED FROM PATIENT (Left) FIRST RIB RESECTION (Left) LEFT SUBCLAVIAN STENT (Left) ARCH AORTOGRAM  Patient location during evaluation: PACU Anesthesia Type: General Level of consciousness: awake and alert Pain management: pain level controlled Vital Signs Assessment: post-procedure vital signs reviewed and stable Respiratory status: spontaneous breathing, nonlabored ventilation, respiratory function stable and patient connected to nasal cannula oxygen Cardiovascular status: blood pressure returned to baseline and stable Postop Assessment: no signs of nausea or vomiting Anesthetic complications: no       Last Vitals:  Vitals:   09/29/16 1100 09/29/16 1144  BP:  (!) 99/56  Pulse:  92  Resp:  19  Temp: 36.8 C     Last Pain:  Vitals:   09/29/16 1100  TempSrc: Oral  PainSc:                  [name]

## 2016-09-29 NOTE — Discharge Instructions (Signed)
You may resume your birth control pills.

## 2016-09-29 NOTE — Care Management Note (Addendum)
Case Management Note  Patient Details  Name: Lucila MaineLynette Rider-Vega MRN: [id number] Date of Birth: Sep 30, 1985  Subjective/Objective:  Presents from home , lives with boyfriend has thoracic outlet syndrome, hematomoa evacuation, physical therapy gave her some exercises for her arm,  JP drain will dc around 9 pm tonight, and she will dc home.   She has medication coverage and she has a PCP, Dr. Shirlean Mylararol Webb.  No other needs.                   Action/Plan:   Expected Discharge Date:  09/29/16               Expected Discharge Plan:  Home/Self Care  In-House Referral:     Discharge planning Services  CM Consult  Post Acute Care Choice:    Choice offered to:     DME Arranged:    DME Agency:     HH Arranged:    HH Agency:     Status of Service:  Completed, signed off  If discussed at [employer] Length of Stay Meetings, dates discussed:    Additional Comments:  Leone Havenaylor, Chaos Carlile Clinton, RN 09/29/2016, 2:18 PM

## 2016-09-29 NOTE — Progress Notes (Signed)
Received order to d/c pt.  Removed JP drain and PIV.  Reviewed discharge paperwork with pt and addressed all questions and concerns.

## 2016-09-29 NOTE — Progress Notes (Addendum)
[facility] Progress Note  Subjective  - POD #1  Left hand pain and numbness is better from pre-op. Tingling sensation left thumb, left 2nd and 3rd fingers. Has sharp pain deep under incision "around shoulder blade" when she coughs.   Objective Vitals:   09/29/16 0135 09/29/16 0420  BP: 103/63   Pulse: 95 (!) 101  Resp: 19 (!) 23  Temp: 98.8 F (37.1 C) 99.5 F (37.5 C)    Intake/Output Summary (Last 24 hours) at 09/29/16 0731 Last data filed at 09/29/16 0400  Gross per 24 hour  Intake          2761.67 ml  Output             1335 ml  Net          1426.67 ml   Left supraclavicular incision without swelling. JP with ~30 cc serosanguinous drainage. Left antecubital incision c/d/I. No hematoma. Brisk left radial and ulnar signals. Palmar arch signals.  Normal grip strength left hand. Sensation intact.    Assessment/Planning: 31 y.o. female is s/p:  -left 1st rib resection -left brachial radial interosseous arterial thrombectomy and vein patch angioplasty left brachial artery -arch aortogram with left upper extremity runoff primary stent left subclavian artery (6 x 29 balloon-expandable) -evacuation of hematoma left neck 1 Day Post-Op   Approximately 95 cc of serosanguinous total output since drain was placed. 30 cc since 0100. No hematoma this am. Will keep drain in for now and monitor.  Left hand numbness and pain improved. Good radial and ulnar signals.  Will check back later this afternoon. Possible d/c if drain output slows down and pain well controlled. Otherwise, transfer to 2W and d/c tomorrow.   Michelle GurneyKimberly A Villa 09/29/2016 7:31 AM -- Agree with above.  Has pretty good range of motion and strength left arm.  Will have PT see today to teach exercises for rehab of arm.  D/c drain later today after PT.  D/c home after drain out.  Follow up 2-3 weeks  Michelle Villa Fields, MD [facility] [place] Office: [phone] Pager:  [phone]  Laboratory CBC    Component Value Date/Time   WBC 5.2 09/25/2016 0831   HGB 13.6 09/25/2016 0831   HCT 39.5 09/25/2016 0831   HCT 38.9 08/24/2016 1032   PLT 236 09/25/2016 0831   PLT 230 08/24/2016 1032    BMET    Component Value Date/Time   NA 143 09/25/2016 0831   NA 138 08/24/2016 1032   K 4.2 09/25/2016 0831   CL 110 09/25/2016 0831   CO2 24 09/25/2016 0831   GLUCOSE 84 09/25/2016 0831   BUN 10 09/25/2016 0831   BUN 9 08/24/2016 1032   CREATININE 0.54 09/25/2016 0831   CALCIUM 9.3 09/25/2016 0831   GFRNONAA >60 09/25/2016 0831   GFRAA >60 09/25/2016 0831    COAG Lab Results  Component Value Date   INR 1.03 09/25/2016   No results found for: PTT  Antibiotics Anti-infectives    Start     Dose/Rate Route Frequency Ordered Stop   09/28/16 2200  cefUROXime (ZINACEF) 1.5 g in dextrose 5 % 50 mL IVPB     1.5 g 100 mL/hr over 30 Minutes Intravenous Every 12 hours 09/28/16 1822 09/29/16 2159   09/28/16 1145  cefUROXime (ZINACEF) 1.5 g in dextrose 5 % 50 mL IVPB  Status:  Discontinued     1.5 g 100 mL/hr over 30 Minutes Intravenous To Surgery 09/28/16 1135 09/28/16  1818   09/28/16 0624  dextrose 5 % with cefUROXime (ZINACEF) ADS Med    Comments:  Michelle Villa   : cabinet override      09/28/16 0624 09/28/16 0801   09/28/16 0623  cefUROXime (ZINACEF) 1.5 g in dextrose 5 % 50 mL IVPB     1.5 g 100 mL/hr over 30 Minutes Intravenous 30 min pre-op 09/28/16 0623 09/28/16 1201       Michelle Berger, PA-C [facility] Office: [phone] Pager: [phone] 09/29/2016 7:31 AM

## 2016-09-29 NOTE — Evaluation (Signed)
Physical Therapy Evaluation Patient Details Name: Michelle Villa MRN: [id number] DOB: 1986-04-23 Today's Date: 09/29/2016   History of Present Illness  Patient is a 31 yo female with thoracic outlet syndrome who presents now s/p Left Brachial, radial, ulnar and intraosseous artery thrombectomy (Left) PATCH ANGIOPLASTY BRACHIAL ARTERY USING VIEN HARVESTED FROM PATIENT (Left)  Clinical Impression  Patient seen for mobility assessment and UE activity program. Provided patient with LUE AROM program for flexion,extension, abduction and shoulder rolls. Patient receptive. Independent with mobility, no further acute PT needs will sign off.    Follow Up Recommendations Outpatient PT (resume outpatient PT when cleared by MD)    Equipment Recommendations  None recommended by PT    Recommendations for Other Services       Precautions / Restrictions Restrictions Weight Bearing Restrictions: No      Mobility  Bed Mobility Overal bed mobility: Modified Independent             General bed mobility comments: increased time to perform  Transfers Overall transfer level: Independent Equipment used: None                Ambulation/Gait Ambulation/Gait assistance: Independent Ambulation Distance (Feet): 210 Feet Assistive device: None Gait Pattern/deviations: WFL(Within Functional Limits)        Stairs            Wheelchair Mobility    Modified Rankin (Stroke Patients Only)       Balance Overall balance assessment: Independent                                           Pertinent Vitals/Pain Pain Assessment: Faces Faces Pain Scale: Hurts even more Pain Location: left UE and neck with movement increased pain with cough Pain Descriptors / Indicators: Operative site guarding Pain Intervention(s): Monitored during session    Home Living Family/patient expects to be discharged to:: Private residence Living Arrangements: Spouse/significant  other   Type of Home: Apartment Home Access: Level entry     Home Layout: One level Home Equipment: None      Prior Function Level of Independence: Independent               Hand Dominance   Dominant Hand: Right    Extremity/Trunk Assessment   Upper Extremity Assessment Upper Extremity Assessment: LUE deficits/detail LUE: Unable to fully assess due to pain    Lower Extremity Assessment Lower Extremity Assessment: Overall WFL for tasks assessed       Communication   Communication: No difficulties  Cognition Arousal/Alertness: Awake/alert Behavior During Therapy: WFL for tasks assessed/performed Overall Cognitive Status: Within Functional Limits for tasks assessed                      General Comments General comments (skin integrity, edema, etc.): provided LUE AROM exercises    Exercises     Assessment/Plan    PT Assessment All further PT needs can be met in the next venue of care  PT Problem List Pain       PT Treatment Interventions      PT Goals (Current goals can be found in the Care Plan section)  Acute Rehab PT Goals PT Goal Formulation: All assessment and education complete, DC therapy    Frequency     Barriers to discharge   left UE and left neck    Co-evaluation  End of Session   Activity Tolerance: Patient tolerated treatment well Patient left: in bed;with call bell/phone within reach;with family/visitor present Nurse Communication: Mobility status PT Visit Diagnosis: Pain Pain - Right/Left: Left Pain - part of body: Arm         Time: 1351-1410 PT Time Calculation (min) (ACUTE ONLY): 19 min   Charges:   PT Evaluation $PT Eval Low Complexity: 1 Procedure     PT G Codes:         Duncan Dull October 01, 2016, 3:55 PM Alben Deeds, [place] DPT  [phone]

## 2016-09-29 NOTE — Anesthesia Postprocedure Evaluation (Addendum)
Anesthesia Post Note  Patient: Michelle Villa  Procedure(s) Performed: Procedure(s) (LRB): EVACUATION HEMATOMA NECK (Left)  Patient location during evaluation: PACU Anesthesia Type: General Level of consciousness: awake and alert and patient cooperative Pain management: pain level controlled Vital Signs Assessment: post-procedure vital signs reviewed and stable Respiratory status: spontaneous breathing and respiratory function stable Cardiovascular status: stable Anesthetic complications: no       Last Vitals:  Vitals:   09/29/16 1100 09/29/16 1144  BP:  (!) 99/56  Pulse:  92  Resp:  19  Temp: 36.8 C     Last Pain:  Vitals:   09/29/16 1100  TempSrc: Oral  PainSc:                  Janes Colegrove S

## 2016-09-30 ENCOUNTER — Telehealth: Payer: Self-pay | Admitting: Vascular Surgery

## 2016-09-30 ENCOUNTER — Telehealth: Payer: Self-pay | Admitting: *Deleted

## 2016-09-30 NOTE — Telephone Encounter (Signed)
Sched appt 10/15/16 at 3:45. Spoke to pt to inform pt of appt.

## 2016-09-30 NOTE — Discharge Summary (Signed)
[facility] Discharge Summary  Michelle Villa 1985/11/25 31 y.o. female  [id number]  Admission Date: 09/28/2016  Discharge Date: 09/29/2016  Physician: Fabienne Brunsharles Fields, MD  Admission Diagnosis: Vascular thoracic outlet syndrome [G54.0]  HPI:   This is a 31 y.o. female a history of left shoulder and hand pain for about 4 months. The patient denies any trauma. She states that all of the symptoms have improved slightly since November. She is currently getting physical therapy. She was seen by neurology for evaluation of this. She had a negative EMG and cervical spine MRI. 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 The patient denies any family history of early atherosclerosis. She denies any prior history of IV drug abuse. She had been told in the past that she had Raynaud's. However, her symptoms always been unilateral on the left side. Her primary complaint is pain in digits 1, 2 and 3 of the left hand with numbness. She also occasionally has pain extending from the midforearm down to the hand. She was placed on aspirin recently.  She had an upper extremity arteriogram last week which showed a 70% narrowing of her left subclavian artery at the thoracic outlet between the clavicle and first rib. This also showed multivessel occlusion below the left brachial artery with primary runoff via the interosseous artery and reconstitution of the distal radial artery. She was placed on Plavix. She returns to the office today after recent CT Angio to make sure she did not have aneurysmal development surrounding the artery.  Hospital Course:  The patient was admitted to the hospital and taken to the operating room on 09/28/2016 and underwent: left first rib resection, left brachial, radial and interosseous artery thrombectomies and vein patch angioplasty left brachial artery, arch aortogram with left upper extremity  runoff, primary stent left subclavian artery (6 x 29 balloon expandable).   Findings: small brachial radial ulnar and interosseous arteries with the distal arteries excepting only a 2 Fogarty catheter  The patient tolerated the procedure well and was transported to the PACU in stable condition.   The patient had a slowly expanding left supraclavicular area without airway compromise. She had no hypotension. She was taken back to the OR for hematoma evacuation. She had diffuse oozing from cortex of the posterior portion of first rib. A 10 flat Jackson-Pratt drain was placed.   On POD 1, the patient's left hand pain was improved. She had some numbness to the tips of a few left fingers. She had good range of motion and strength in her left arm. She had brisk right radial, ulnar and palmar arch signals. Her incisions were intact without hematoma.  PT recommended outpatient PT. Her drain had less than 100 cc output and was discontinued. Her pain was manageable on po pain medication. She was discharged home on POD 1 in good condition.   CBC    Component Value Date/Time   WBC 7.5 09/29/2016 1012   RBC 3.00 (L) 09/29/2016 1012   HGB 10.1 (L) 09/29/2016 1012   HCT 29.6 (L) 09/29/2016 1012   HCT 38.9 08/24/2016 1032   PLT 179 09/29/2016 1012   PLT 230 08/24/2016 1032   MCV 98.7 09/29/2016 1012   MCV 100 (H) 08/24/2016 1032   MCH 33.7 09/29/2016 1012   MCHC 34.1 09/29/2016 1012   RDW 12.3 09/29/2016 1012   RDW 12.8 08/24/2016 1032   LYMPHSABS 2.4 08/24/2016 1032   EOSABS  0.3 08/24/2016 1032   BASOSABS 0.0 08/24/2016 1032    BMET    Component Value Date/Time   NA 141 09/29/2016 1012   NA 138 08/24/2016 1032   K 3.4 (L) 09/29/2016 1012   CL 108 09/29/2016 1012   CO2 26 09/29/2016 1012   GLUCOSE 98 09/29/2016 1012   BUN 10 09/29/2016 1012   BUN 9 08/24/2016 1032   CREATININE 0.84 09/29/2016 1012   CALCIUM 8.3 (L) 09/29/2016 1012   GFRNONAA >60 09/29/2016 1012   GFRAA >60 09/29/2016 1012      Discharge Instructions:   The patient is discharged to home with extensive instructions on wound care and progressive ambulation.  They are instructed not to drive or perform any heavy lifting until returning to see the physician in his office.  Discharge Instructions    Call MD for:  redness, tenderness, or signs of infection (pain, swelling, bleeding, redness, odor or green/yellow discharge around incision site)    Complete by:  As directed    Call MD for:  severe or increased pain, loss or decreased feeling  in affected limb(s)    Complete by:  As directed    Call MD for:  temperature >100.5    Complete by:  As directed    Discharge wound care:    Complete by:  As directed    You may shower tonight. Take off dressing on groin if there is one.   Wash left chest wound and left arm wounds daily with soap and water and pat dry. Do not apply any creams or ointments on your incisions. Do not pull off the skin glue. It will peel off on its own.   Driving Restrictions    Complete by:  As directed    No driving for 2 weeks   Lifting restrictions    Complete by:  As directed    No lifting for 2 weeks   Resume previous diet    Complete by:  As directed       Discharge Diagnosis:  Vascular thoracic outlet syndrome [G54.0]  Secondary Diagnosis: Patient Active Problem List   Diagnosis Date Noted  . Arterial thoracic outlet syndrome due to cervical rib 09/28/2016  . Vascular thoracic outlet syndrome 09/28/2016  . PAD (peripheral artery disease) (HCC) 09/17/2016  . Limb ischemia 09/17/2016  . Raynaud's phenomenon 07/20/2016   Past Medical History:  Diagnosis Date  . Anxiety   . Raynaud phenomenon      Allergies as of 09/29/2016      Reactions   Other Hives   CITRUS       Medication List    STOP taking these medications   clopidogrel 75 MG tablet Commonly known as:  PLAVIX     TAKE these medications   acetaminophen 500 MG tablet Commonly known as:  TYLENOL Take  1,000 mg by mouth every 6 (six) hours as needed for moderate pain.   aspirin EC 81 MG tablet Take 81 mg by mouth daily.   clonazePAM 0.5 MG tablet Commonly known as:  KLONOPIN Take 0.5 mg by mouth daily as needed for anxiety.   Echinacea 400 MG Caps Take 400 mg by mouth daily.   MULTI COMPLETE PO Take 1 tablet by mouth daily.   OVER THE COUNTER MEDICATION Take 1 tablet by mouth at bedtime as needed (sleep). Passion Flower otc sleep aid   traMADol 50 MG tablet Commonly known as:  ULTRAM Take 1-2 tablets (50-100 mg total) by mouth every 6 (  six) hours as needed for moderate pain (DEPENDS ON PAIN LEVEL IF TAKES 1-2 TABLETS).   vitamin B-12 250 MCG tablet Commonly known as:  CYANOCOBALAMIN Take 250 mcg by mouth daily.       Tramadol #30 No Refill  Disposition: Home  Patient's condition: is Good  Follow up: 1. Dr. Darrick Penna in 2 weeks   Maris Berger, PA-C [facility] [phone] 09/30/2016  9:41 AM

## 2016-09-30 NOTE — Telephone Encounter (Signed)
Returned call to TutwilerBen.  He's stating that the patient is experiencing slight numbness between the incision and wrist.  He states there is no other symptoms concerning patient.  Directed Ben to call back should numbness worsen or other symptoms develop.

## 2016-09-30 NOTE — Telephone Encounter (Signed)
-----   Message from Phillips Odorarol S Pullins, RN sent at 09/29/2016  4:58 PM EDT ----- Regarding: needs 2 week f/u with Dr. Darrick PennaFields   ----- Message ----- From: [name], PA-C Sent: 09/29/2016   2:09 PM To: [facility] Charge Pool  s/p left 1st rib resection, left subclavian artery stenting 09/28/16  F/u with Dr. Darrick PennaFields in 2 weeks  Thanks Selena BattenKim

## 2016-10-01 ENCOUNTER — Other Ambulatory Visit: Payer: Self-pay | Admitting: Neurology

## 2016-10-01 DIAGNOSIS — D6859 Other primary thrombophilia: Secondary | ICD-10-CM

## 2016-10-05 ENCOUNTER — Telehealth: Payer: Self-pay

## 2016-10-05 NOTE — Telephone Encounter (Signed)
Phone call from pt.  Reported she has had headaches since the surgery.  Reported they come and go, but are worse at night.  Stated "the pain is from top of right side of head to ear to and into neck."  Stated "it feels like the top of my head could come off at times."  Questioned about vision changes. Stated when she takes the Tramadol, it is harder to see at times.  Denied vision change in correlation with headaches.   Also reported continues to have a sharp pain under the left shoulder blade with taking a deep breath or cough.  Stated it is also present when she lays down at night.  Denied fever/ chills.  Reported her incisions look good; "like they are healing."  Advised will make Dr. Darrick PennaFields aware, and will return call to pt.  Agreed.

## 2016-10-05 NOTE — Telephone Encounter (Signed)
Dr. Darrick PennaFields responded to message about pt's symptoms; was in agreement to move appt. To 3/29 with no recommended testing prior to appt.  Pt. made aware; advised if pain symptoms worsen, to go to the ER.  Verb. Understanding.  Agreed w/ plan.

## 2016-10-05 NOTE — Telephone Encounter (Signed)
Pt on for 3/29 at 4pm, pt aware

## 2016-10-06 ENCOUNTER — Encounter: Payer: Self-pay | Admitting: Vascular Surgery

## 2016-10-07 ENCOUNTER — Ambulatory Visit (HOSPITAL_BASED_OUTPATIENT_CLINIC_OR_DEPARTMENT_OTHER): Payer: BLUE CROSS/BLUE SHIELD

## 2016-10-07 ENCOUNTER — Encounter: Payer: Self-pay | Admitting: Hematology

## 2016-10-07 ENCOUNTER — Telehealth: Payer: Self-pay | Admitting: Hematology

## 2016-10-07 ENCOUNTER — Ambulatory Visit (HOSPITAL_BASED_OUTPATIENT_CLINIC_OR_DEPARTMENT_OTHER): Payer: BLUE CROSS/BLUE SHIELD | Admitting: Hematology

## 2016-10-07 VITALS — BP 139/76 | HR 105 | Temp 98.8°F | Resp 18 | Ht 63.0 in | Wt 146.0 lb

## 2016-10-07 DIAGNOSIS — R7989 Other specified abnormal findings of blood chemistry: Secondary | ICD-10-CM

## 2016-10-07 DIAGNOSIS — I742 Embolism and thrombosis of arteries of the upper extremities: Secondary | ICD-10-CM | POA: Diagnosis not present

## 2016-10-07 DIAGNOSIS — E042 Nontoxic multinodular goiter: Secondary | ICD-10-CM

## 2016-10-07 DIAGNOSIS — D759 Disease of blood and blood-forming organs, unspecified: Secondary | ICD-10-CM

## 2016-10-07 LAB — CBC & DIFF AND RETIC
BASO%: 0.8 % (ref 0.0–2.0)
Basophils Absolute: 0.1 10*3/uL (ref 0.0–0.1)
EOS%: 3.6 % (ref 0.0–7.0)
Eosinophils Absolute: 0.3 10*3/uL (ref 0.0–0.5)
HCT: 35.1 % (ref 34.8–46.6)
HGB: 12 g/dL (ref 11.6–15.9)
Immature Retic Fract: 19.3 % — ABNORMAL HIGH (ref 1.60–10.00)
LYMPH%: 21.1 % (ref 14.0–49.7)
MCH: 33.6 pg (ref 25.1–34.0)
MCHC: 34.1 g/dL (ref 31.5–36.0)
MCV: 98.4 fL (ref 79.5–101.0)
MONO#: 0.8 10*3/uL (ref 0.1–0.9)
MONO%: 9.7 % (ref 0.0–14.0)
NEUT#: 5 10*3/uL (ref 1.5–6.5)
NEUT%: 64.8 % (ref 38.4–76.8)
Platelets: 373 10*3/uL (ref 145–400)
RBC: 3.57 10*6/uL — ABNORMAL LOW (ref 3.70–5.45)
RDW: 12.4 % (ref 11.2–14.5)
Retic %: 3.96 % — ABNORMAL HIGH (ref 0.70–2.10)
Retic Ct Abs: 141.37 10*3/uL — ABNORMAL HIGH (ref 33.70–90.70)
WBC: 7.7 10*3/uL (ref 3.9–10.3)
lymph#: 1.6 10*3/uL (ref 0.9–3.3)

## 2016-10-07 LAB — COMPREHENSIVE METABOLIC PANEL
ALT: 62 U/L — ABNORMAL HIGH (ref 0–55)
AST: 35 U/L — ABNORMAL HIGH (ref 5–34)
Albumin: 4.3 g/dL (ref 3.5–5.0)
Alkaline Phosphatase: 79 U/L (ref 40–150)
Anion Gap: 9 mEq/L (ref 3–11)
BUN: 6.2 mg/dL — ABNORMAL LOW (ref 7.0–26.0)
CO2: 26 mEq/L (ref 22–29)
Calcium: 10.1 mg/dL (ref 8.4–10.4)
Chloride: 105 mEq/L (ref 98–109)
Creatinine: 0.7 mg/dL (ref 0.6–1.1)
EGFR: >90 mL/min/{1.73_m2} (ref >90)
Glucose: 82 mg/dl (ref 70–140)
Potassium: 3.8 mEq/L (ref 3.5–5.1)
Sodium: 140 mEq/L (ref 136–145)
Total Bilirubin: 0.34 mg/dL (ref 0.20–1.20)
Total Protein: 7.6 g/dL (ref 6.4–8.3)

## 2016-10-07 LAB — IRON AND TIBC
%SAT: 29 % (ref 21–57)
Iron: 89 ug/dL (ref 41–142)
TIBC: 304 ug/dL (ref 236–444)
UIBC: 215 ug/dL (ref 120–384)

## 2016-10-07 LAB — FERRITIN: Ferritin: 202 ng/ml (ref 9–269)

## 2016-10-07 NOTE — Telephone Encounter (Signed)
Gave patient  AVS and sent patient to the lab per 10/07/2016 los

## 2016-10-07 NOTE — Progress Notes (Signed)
Marland Kitchen    HEMATOLOGY/ONCOLOGY CONSULTATION NOTE  Date of Service: 10/07/2016  Patient Care Team: Shirlean Mylar, MD as PCP - General (Family Medicine) Anson Fret, MD as Consulting Physician (Neurology)  CHIEF COMPLAINTS/PURPOSE OF CONSULTATION:   Elevated ferritin Arterial thrombosis Fatty conversion of bone marrow  HISTORY OF PRESENTING ILLNESS:    Michelle Villa is a wonderful 31 y.o. female who has been referred to [place] by Dr Lucia Gaskins for evaluation and management of bone marrow changes noted incidentally on MRI c-spine.  Patient is a otherwise healthy 31 year old female who reports having left upper extremity pain swelling and symptoms of her fingers turning blue for 3-4 months. She also had discomfort in the left upper extremity and was referred to neurology for evaluation of possible neuropathy versus radiculopathy.   EMG on 07/24/2016 showed normal study with no evidence of neuropathy or cervical radiculopathy. Patient had an MRI of the cervical spine without contrast on 08/05/2016 which noted nonspecific heterogenous T1 and T2 hyperintensity in the marrow scattered throughout the vertebral bodies from C2-T2. Considerations were fatty marrow changes versus hemangiomas. No spinal or for omental stenosis was noted. Incidental note of a multinodular goiter.  Patient subsequently had worsening left upper extremity symptoms and was seen by vascular surgery Dr. Darrick Penna and had a left upper extremity angiogram on 09/11/2016 due to left hand ischemia. This showed  #1 ulcerated narrowing midportion left subclavian artery adjacent to first rib suggestive of thoracic outlet syndrome  #2 left brachial artery occlusion ulnar artery occlusion radial artery occlusion with outflow via the interosseous and minimal digital artery flow Her left brachial artery occlusion was addressed with Left Brachial, radial, ulnar and intraosseous artery thrombectomy. PATCH ANGIOPLASTY BRACHIAL ARTERY USING VIEN  HARVESTED FROM PATIENT  Subsequently on 09/16/2016 she had CTA of the chest   which showed that the left subclavian demonstrates a normal origin with normal origin of the vertebral artery. At the level of the first costoclavicular space there is a focal 60% stenosis of the vessel related to noncalcified plaque. This is best visualized on the coronal imaging number 57 of series 8. The subclavian artery distal to this into the axillary artery is widely patent. No definitive ulceration is noted on these images. No aneurysmal dilatation is identified. Pulmonary artery as visualized is within normal limits. No significant cardiac enlargement is noted. No definitive atrial thrombus is seen.   she subsequently had surgery on 09/28/2016 with FIRST RIB RESECTION Left General  LEFT SUBCLAVIAN STENT Left General  ARCH AORTOGRAM     Complicated with a postoperative hematoma that required evacuation and caused her to drop her hemoglobin about 2-3 points .  Due to her bone marrow signal abnormality and SPEP was done on 08/24/2016 which showed no monoclonal protein and normal immunofixation electrophoresis. Workup also included a ferritin which was borderline elevated at 236 with the plan saturation of 53%. Could have been from an acute phase reactant perspective. Urine pregnancy test was negative.   Patient reports no new bone pains. Her CBC today is within normal limits with no abnormalities in her blood cells. Her resected rib showed normal hematopoietic elements with no evidence of malignancy.  No family history of liver cirrhosis or early heart disease or iron overload issues . No family history of venous or arterial thrombosis .  She was on oral contraceptives which were discontinued on 09/03/2016 . Patient notes that she has been on hormonal birth control since age 33 years due to heavy periods .  Patient  was previously on Plavix and now has been switched to aspirin by her vascular surgeon and is  tolerating this okay .   MEDICAL HISTORY:  Past Medical History:  Diagnosis Date  . Anxiety   . Raynaud phenomenon     SURGICAL HISTORY: Past Surgical History:  Procedure Laterality Date  . AORTIC ARCH ANGIOGRAPHY N/A 09/11/2016   Procedure: Aortic Arch Angiography;  Surgeon: Sherren Kerns, MD;  Location: [facility] INVASIVE CV LAB;  Service: Cardiovascular;  Laterality: N/A;  . EMBOLECTOMY Left 09/28/2016   Procedure: Left Brachial, radial, ulnar and intraosseous artery thrombectomy;  Surgeon: Sherren Kerns, MD;  Location: [facility] OR;  Service: Vascular;  Laterality: Left;  . I&D EXTREMITY Left 09/28/2016   Procedure: EVACUATION HEMATOMA NECK;  Surgeon: Sherren Kerns, MD;  Location: [facility] OR;  Service: Vascular;  Laterality: Left;  . PATCH ANGIOPLASTY Left 09/28/2016   Procedure: PATCH ANGIOPLASTY BRACHIAL ARTERY USING VIEN HARVESTED FROM PATIENT;  Surgeon: Sherren Kerns, MD;  Location: [facility] OR;  Service: Vascular;  Laterality: Left;  . PERIPHERAL VASCULAR INTERVENTION Left 09/28/2016   Procedure: LEFT SUBCLAVIAN STENT;  Surgeon: Sherren Kerns, MD;  Location: [facility] OR;  Service: Vascular;  Laterality: Left;  . RIB RESECTION Left 09/28/2016   Procedure: FIRST RIB RESECTION;  Surgeon: Sherren Kerns, MD;  Location: [facility] OR;  Service: Vascular;  Laterality: Left;  . UPPER EXTREMITY ANGIOGRAPHY N/A 09/11/2016   Procedure: Upper Extremity Angiography - Left;  Surgeon: Sherren Kerns, MD;  Location: [facility] INVASIVE CV LAB;  Service: Cardiovascular;  Laterality: N/A;  . WISDOM TOOTH EXTRACTION      SOCIAL HISTORY: Social History   Social History  . Marital status: Single    Spouse name: N/A  . Number of children: N/A  . Years of education: N/A   Occupational History  . waitress    Social History Main Topics  . Smoking status: Never Smoker  . Smokeless tobacco: Never Used  . Alcohol use 4.2 oz/week    7 Standard drinks or equivalent per week     Comment: 7 per week average  . Drug use: No  .  Sexual activity: Not on file   Other Topics Concern  . Not on file   Social History Narrative  . No narrative on file    FAMILY HISTORY: Family History  Problem Relation Age of Onset  . Hypertension Father   . Hyperlipidemia Father   . Neuropathy Neg Hx     ALLERGIES:  is allergic to other.  MEDICATIONS:  Current Outpatient Prescriptions  Medication Sig Dispense Refill  . acetaminophen (TYLENOL) 500 MG tablet Take 1,000 mg by mouth every 6 (six) hours as needed for moderate pain.    Marland Kitchen aspirin EC 81 MG tablet Take 81 mg by mouth daily.    . clonazePAM (KLONOPIN) 0.5 MG tablet Take 0.5 mg by mouth daily as needed for anxiety.     . Echinacea 400 MG CAPS Take 400 mg by mouth daily.    . Multiple Vitamins-Minerals (MULTI COMPLETE PO) Take 1 tablet by mouth daily.     Marland Kitchen OVER THE COUNTER MEDICATION Take 1 tablet by mouth at bedtime as needed (sleep). Passion Flower otc sleep aid    . traMADol (ULTRAM) 50 MG tablet Take 1-2 tablets (50-100 mg total) by mouth every 6 (six) hours as needed for moderate pain (DEPENDS ON PAIN LEVEL IF TAKES 1-2 TABLETS). 30 tablet 0  . vitamin B-12 (CYANOCOBALAMIN) 250 MCG tablet Take 250  mcg by mouth daily.     No current facility-administered medications for this visit.     REVIEW OF SYSTEMS:    10 Point review of Systems was done is negative except as noted above.  PHYSICAL EXAMINATION: ECOG PERFORMANCE STATUS: 0 - Asymptomatic  . Vitals:   10/07/16 1120  BP: 139/76  Pulse: (!) 105  Resp: 18  Temp: 98.8 F (37.1 C)   Filed Weights   10/07/16 1120  Weight: 146 lb (66.2 kg)   .Body mass index is 25.86 kg/m.  GENERAL:alert, in no acute distress and comfortable SKIN: no acute rashes, no significant lesions EYES: conjunctiva are pink and non-injected, sclera anicteric OROPHARYNX: MMM, no exudates, no oropharyngeal erythema or ulceration NECK: supple, no JVD LYMPH:  no palpable lymphadenopathy in the cervical, axillary or inguinal  regions LUNGS: clear to auscultation b/l with normal respiratory effort HEART: regular rate & rhythm ABDOMEN:  normoactive bowel sounds , non tender, not distended., no palpable hepatosplenomegaly  Extremity: no pedal edema. Minimal swelling in the left upper extremity . PSYCH: alert & oriented x 3 with fluent speech NEURO: no focal motor/sensory deficits  LABORATORY DATA:  I have reviewed the data as listed  . CBC Latest Ref Rng & Units 10/07/2016 09/29/2016 09/25/2016  WBC 3.9 - 10.3 10e3/uL 7.7 7.5 5.2  Hemoglobin 11.6 - 15.9 g/dL 16.1 10.1(L) 13.6  Hematocrit 34.8 - 46.6 % 35.1 29.6(L) 39.5  Platelets 145 - 400 10e3/uL 373 179 236   . CBC    Component Value Date/Time   WBC 7.7 10/07/2016 1327   WBC 7.5 09/29/2016 1012   RBC 3.57 (L) 10/07/2016 1327   RBC 3.00 (L) 09/29/2016 1012   HGB 12.0 10/07/2016 1327   HCT 35.1 10/07/2016 1327   PLT 373 10/07/2016 1327   PLT 230 08/24/2016 1032   MCV 98.4 10/07/2016 1327   MCH 33.6 10/07/2016 1327   MCH 33.7 09/29/2016 1012   MCHC 34.1 10/07/2016 1327   MCHC 34.1 09/29/2016 1012   RDW 12.4 10/07/2016 1327   LYMPHSABS 1.6 10/07/2016 1327   MONOABS 0.8 10/07/2016 1327   EOSABS 0.3 10/07/2016 1327   EOSABS 0.3 08/24/2016 1032   BASOSABS 0.1 10/07/2016 1327    CMP Latest Ref Rng & Units 10/07/2016 09/29/2016 09/25/2016  Glucose 70 - 140 mg/dl 82 98 84  BUN 7.0 - 09.6 mg/dL 6.2(L) 10 10  Creatinine 0.6 - 1.1 mg/dL 0.7 0.45 4.09  Sodium 811 - 145 mEq/L 140 141 143  Potassium 3.5 - 5.1 mEq/L 3.8 3.4(L) 4.2  Chloride 101 - 111 mmol/L - 108 110  CO2 22 - 29 mEq/L 26 26 24   Calcium 8.4 - 10.4 mg/dL 91.4 7.8(G) 9.3  Total Protein 6.4 - 8.3 g/dL 7.6 - 6.5  Total Bilirubin 0.20 - 1.20 mg/dL 9.56 - 0.4  Alkaline Phos 40 - 150 U/L 79 - 54  AST 5 - 34 U/L 35(H) - 35  ALT 0 - 55 U/L 62(H) - 37       Component     Latest Ref Rng & Units 10/07/2016  PTT-LA     0.0 - 51.9 sec 31.1  DRVVT     0.0 - 47.0 sec 41.5  Lupus Anticoag  Interp      Comment:  Anticardiolipin Ab,IgG,Qn     0 - 14 GPL U/mL <9  Anticardiolipin Ab,IgM,Qn     0 - 12 MPL U/mL <9  Anticardiolipin Ab,IgA,Qn     0 - 11 APL U/mL <9  Beta-2 Glycoprotein I Ab, IgG     0 - 20 GPI IgG units <9  Beta-2 Glyco 1 IgA     0 - 25 GPI IgA units <9  Beta-2 Glyco 1 IgM     0 - 32 GPI IgM units <9  Antithrombin Activity     75 - 135 % 123  Protein C-Functional     73 - 180 % 113  Protein S-Functional     63 - 140 % 108      RADIOGRAPHIC STUDIES: I have personally reviewed the radiological images as listed and agreed with the findings in the report. Dg Chest Port 1 View  Result Date: 09/28/2016 CLINICAL DATA:  Status post first rib resection on the left EXAM: PORTABLE CHEST 1 VIEW COMPARISON:  CT 09/16/2016 FINDINGS: Low lung volumes. The right lung is clear. There is partial resection of the left first rib. No pneumothorax. Mild atelectasis or infiltrate at the left lung base. Soft tissue thickening and gas in the left supraclavicular and subclavian region likely related to recent postop status. Surgical clips at the base of left neck with vascular stent beneath the mid left clavicle. IMPRESSION: 1. Low lung volumes. No pneumothorax or pleural effusion. Small atelectasis versus infiltrate medial left lung base 2. Post- operative changes of the left first rib with soft tissue opacity and small gas, presumably related to postsurgical changes. Electronically Signed   By: Jasmine PangKim  Fujinaga M.D.   On: 09/28/2016 15:13   Ct Angio Chest Aorta W &/or Wo Contrast  Result Date: 09/17/2016 CLINICAL DATA:  Known left subclavian artery stenosis, evaluate for possible aneurysmal dilatation EXAM: CT ANGIOGRAPHY CHEST WITH CONTRAST TECHNIQUE: Multidetector CT imaging of the chest was performed using the standard protocol during bolus administration of intravenous contrast. Multiplanar CT image reconstructions and MIPs were obtained to evaluate the vascular anatomy. CONTRAST:  75  mL Isovue 370. COMPARISON:  By report from a angiogram obtained on 09/11/2016 FINDINGS: Cardiovascular: Thoracic aorta is within normal limits without aneurysmal dilatation or significant atherosclerotic calcification. The origins of the brachiocephalic vessels are within normal limits. The carotid arteries and right subclavian artery as visualized are within normal limits as well. The left subclavian demonstrates a normal origin with normal origin of the vertebral artery. At the level of the first costoclavicular space there is a focal 60% stenosis of the vessel related to noncalcified plaque. This is best visualized on the coronal imaging number 57 of series 8. The subclavian artery distal to this into the axillary artery is widely patent. No definitive ulceration is noted on these images. No aneurysmal dilatation is identified. Pulmonary artery as visualized is within normal limits. No significant cardiac enlargement is noted. No definitive atrial thrombus is seen. Mediastinum/Nodes: No hilar or mediastinal adenopathy is noted. The thoracic inlet demonstrates evidence of multinodular goiter. A dominant nodule is noted within the isthmus measuring approximately 2.4 cm in greatest dimension with peripheral calcifications. Lungs/Pleura: Lungs are well aerated bilaterally. No focal infiltrate or sizable effusion is seen. No nodular changes are noted. Upper Abdomen: Visualized upper abdomen reveals no focal abnormality. Musculoskeletal: No acute abnormality noted. Review of the MIP images confirms the above findings. IMPRESSION: 60% stenosis in the mid left subclavian artery secondary to eccentric noncalcified plaque. This lies at the first costoclavicular space. Changes in the thyroid with a dominant isthmic nodule. Ultrasound evaluation is recommended for further evaluation No other focal abnormality is seen. Electronically Signed   By: Alcide CleverMark  Lukens M.D.   On:  09/17/2016 07:27   US Thyroid  Result Date:  09/24/2016 CLINICAL DATA:  Thyroid nodules on recent CT chest EXAM: THYROID ULTRASOUND TECHNIQUE: Ultrasound examination of the thyroid gland and adjacent soft tissues was performed. COMPARISON:  CT 09/16/2016 FINDINGS: Parenchymal Echotexture: Moderately heterogenous Isthmus: 0.5 cm thickness Right lobe: 6.4 x 1.9 x 2.9 cm Left lobe: 5.4 x 1.7 x 2 cm _________________________________________________________ Estimated total number of nodules >/= 1 cm: 6-10 Number of spongiform nodules >/=  2 cm not described below (TR1): 0 Number of mixed cystic and solid nodules >/= 1.5 cm not described below (TR2): 0 _________________________________________________________ Nodule # 1: Location: Isthmus; Superior right of midline Maximum size: 2.7 cm; Other 2 dimensions: 1.9 x 2.2 cm Composition: solid/almost completely solid (2) Echogenicity: hypoechoic (2) Shape: not taller-than-wide (0) Margins: smooth (0) Echogenic foci: none (0) ACR TI-RADS total points: 4. ACR TI-RADS risk category: TR 4 . ACR TI-RADS recommendations: **Given size (>/= 1.5 cm) and appearance, fine needle aspiration of this moderately suspicious nodule should be considered based on TI-RADS criteria. _________________________________________________________ Nodule # 2: Location: Left; Superior Maximum size: 1.7 cm; Other 2 dimensions: 1.1 x 1.2 cm Composition: solid/almost completely solid (2) Echogenicity: hypoechoic (2) Shape: not taller-than-wide (0)New Margins: ill-defined (0) Echogenic foci: none (0) ACR TI-RADS total points: 4. ACR TI-RADS risk category: TR4 (4-6 points). ACR TI-RADS recommendations: **Given size (>/= 1.5 cm) and appearance, fine needle aspiration of this moderately suspicious nodule should be considered based on TI-RADS criteria. _________________________________________________________ Nodule # 2: Location: Left; Mid Maximum size: 2 cm; Other 2 dimensions: 0.8 x 1.7 cm Composition: solid/almost completely solid (2) Echogenicity:  hypoechoic (2) Shape: not taller-than-wide (0) Margins: smooth (0) Echogenic foci: none (0) ACR TI-RADS total points: 4. ACR TI-RADS risk category: TR4 (4-6 points). ACR TI-RADS recommendations: **Given size (>/= 1.5 cm) and appearance, fine needle aspiration of this moderately suspicious nodule should be considered based on TI-RADS criteria. _________________________________________________________ 1.1 cm hypoechoic nodule without  calcifications, deep mid left 0.7 cm hypoechoic nodule without calcifications, inferior left 1.4 cm hypoechoic nodule without calcifications, superior right 1 cm hypoechoic nodule without calcifications, mid right 1.3 cm hypoechoic nodule without calcifications, medial right 0.98 cm  hypoechoic nodule without calcifications, inferior right IMPRESSION: 1. Thyromegaly with multiple bilateral nodules. 2. Recommend FNA biopsy of moderately suspicious isthmic and left nodules. 3. Recommend 1 year follow-up surveillance ultrasound of additional nodules as above. The above is in keeping with the ACR TI-RADS recommendations - J Am Coll Radiol 2017;14:587-595. Electronically Signed   By: Corlis Leak M.D.   On: 09/24/2016 09:49    ASSESSMENT & PLAN:   31 year old female with   #1 left upper extremity arterial thrombosis related to thoracic outlet syndrome. Status post resection of the first rib and subclavian artery stenting. Status post intervention to remove the clots in the left upper extremity.   Oral contraceptives could be additional factor though she has been on this since age 89 years without any issues. Currently on hold. EKG normal sinus rhythm  Unlikely to be an additional inherited or acquired thrombophilia - most of the stent increase the risk of venous thromboembolism. Plan -Discussed the pros and cons of thrombophilia testing with the patient in the light that her left upper extremity issues are dominantly from thoracic outlet obstruction with left subclavian  stenosis. -She was agreeable to testing to rule out other possibilities. -Her antiphospholipid antibody workup was negative, antithrombin III, protein C protein S - within normal limits -Pending test results for prothrombin  gene mutation and factor V Leiden mutation though these typically don't present as arterial thrombosis except rarely homozygous factor V Leiden mutation can present as such . -continue aspirin  - no indication for therapeutic anticoagulation at this time. -Would recommend echo with bubble study to complete the workup unless done already.  #2 Bone marrow non-specific changes. Discuss with radiologist. Appeared to be likely hemangiomas. Resected rib showed normal marrow elements with no evidence of malignancy. SPEP showed no M protein and normal IFE. Patient's CBC is completely within normal limits and does not suggest any overt bone marrow functional abnormality at this time. Plan -Repeat CBC with differential with primary care physician in 3-4 months. -Reconsult if significant abnormalities in CBC arise which might trigger additional workup.  #3 elevated ferritin level. This was likely an acute phase reactant. Repeat ferritin level today within normal limits. Plan -Given her previous steroid ferritin HFE mutation study was ordered - currently pending.  #4 acute blood loss anemia due to postoperative hematoma. Now resolved.  #5 multinodular goiter- further workup as per primary care physician and endocrinology.   continue follow-up with primary care physician . We will call the patient with her test results .   All of the patients questions were answered with apparent satisfaction. The patient knows to call the clinic with any problems, questions or concerns.  I spent 50 minutes counseling the patient face to face. The total time spent in the appointment was 60 minutes and more than 50% was on counseling and direct patient cares.    Wyvonnia Lora MD MS AAHIVMS [facility]  [facility] Hematology/Oncology Physician [facility]  (Office):       [phone] (Work cell):  [phone] (Fax):           [phone]  10/07/2016 12:05 PM

## 2016-10-08 ENCOUNTER — Ambulatory Visit (INDEPENDENT_AMBULATORY_CARE_PROVIDER_SITE_OTHER): Payer: Self-pay | Admitting: Vascular Surgery

## 2016-10-08 ENCOUNTER — Encounter: Payer: Self-pay | Admitting: Vascular Surgery

## 2016-10-08 ENCOUNTER — Ambulatory Visit
Admission: RE | Admit: 2016-10-08 | Discharge: 2016-10-08 | Disposition: A | Discharge status: Home / Self Care | Payer: BLUE CROSS/BLUE SHIELD | Source: Ambulatory Visit | Attending: Vascular Surgery | Admitting: Vascular Surgery

## 2016-10-08 VITALS — BP 133/86 | HR 114 | Temp 98.9°F | Resp 18 | Ht 63.0 in | Wt 146.9 lb

## 2016-10-08 DIAGNOSIS — R079 Chest pain, unspecified: Secondary | ICD-10-CM | POA: Diagnosis not present

## 2016-10-08 DIAGNOSIS — G540 Brachial plexus disorders: Secondary | ICD-10-CM

## 2016-10-08 NOTE — Progress Notes (Signed)
Patient is a 31 year old female who returns for postoperative follow-up today. She recently underwent a left first rib resection left subclavian artery stenting and left brachial embolectomy. This was all secondary to a left subclavian stenosis. The be secondary to thoracic outlet syndrome with distal embolization. She returns today stating that her left hand intermittently still has some cold intolerance. She also still has some stiffness in her left elbow postop which she is working on. She has no numbness or tingling in her hand. She also complains of pain in the left tip of the scapula and upper portion of her left chest when taking a deep breath. She does not really complain of shortness of breath. She also complains of headaches on the right side of her head which are nonpulsatile. She states that they are variable but pre-much last every day all day long. She has not really had a significant history of headaches in the past. She is able to move the left arm through all range of motion but does have some pain associated with this.  Physical exam:  Vitals:   10/08/16 1618  BP: 133/86  Pulse: (!) 114  Resp: 18  Temp: 98.9 F (37.2 C)  TempSrc: Oral  SpO2: 98%  Weight: 146 lb 14.4 oz (66.6 kg)  Height: 5\' 3"  (1.6 m)    Neck: No carotid bruits well-healed left infraclavicular incision   Back: No winging of the scapula trapezius muscle function intact with shoulder shrug and rolling the shoulder forward and backward.  Neuro: 5 over 5 motor strength symmetric bilateral upper extremities although left upper extremity is somewhat limited secondary to pain Extraocular movements intact pupils equal and round  Assessment: Overall doing well status post first rib resection with left subclavian artery stenting. Headaches most likely secondary to OR positioning and should resolve with time. However, since we were manipulating around the area of the vertebral and carotid arteries will obtain a brain  MRI to make sure that she has not had any evidence of stroke. If no evidence of stroke no further workup should be necessary for this. As far as the left shoulder and back pain are concerned we will obtain a chest x-ray to make sure that she does not have a pneumothorax. If she does not she was reassured that this is most likely this secondary to pain from having the first rib removed and she will have symptoms associated with rib fracture from this. Incidentally found were multiple nodules in her thyroid gland and she has follow-up scheduled with Dr. Ezzard StandingNewman regarding this next week.  Plan: Chest x-ray PA and lateral today to rule out pneumothorax. #2 MRI of brain to rule out stroke.  The above studies are negative she will follow-up with me in 1 year.  Fabienne Brunsharles Rondalyn Belford, MD [facility] Office: [phone] Pager: [phone]

## 2016-10-09 LAB — PROTEIN S ACTIVITY: Protein S-Functional: 108 % (ref 63–140)

## 2016-10-09 LAB — BETA-2-GLYCOPROTEIN I ABS, IGG/M/A
Beta-2 Glyco 1 IgA: <9 GPI IgA units (ref 0–25)
Beta-2 Glyco 1 IgM: <9 GPI IgM units (ref 0–32)
Beta-2 Glycoprotein I Ab, IgG: <9 GPI IgG units (ref 0–20)

## 2016-10-09 LAB — LUPUS ANTICOAGULANT PANEL
PTT-LA: 31.1 s (ref 0.0–51.9)
dRVVT: 41.5 s (ref 0.0–47.0)

## 2016-10-09 LAB — ANTITHROMBIN III: Antithrombin Activity: 123 % (ref 75–135)

## 2016-10-09 LAB — CARDIOLIPIN ANTIBODIES, IGG, IGM, IGA
Anticardiolipin Ab,IgA,Qn: <9 APL U/mL (ref 0–11)
Anticardiolipin Ab,IgG,Qn: <9 GPL U/mL (ref 0–14)
Anticardiolipin Ab,IgM,Qn: <9 MPL U/mL (ref 0–12)

## 2016-10-09 LAB — PROTEIN C ACTIVITY: Protein C-Functional: 113 % (ref 73–180)

## 2016-10-12 ENCOUNTER — Telehealth: Payer: Self-pay

## 2016-10-12 ENCOUNTER — Telehealth: Payer: Self-pay | Admitting: Vascular Surgery

## 2016-10-12 LAB — PROTHROMBIN GENE MUTATION

## 2016-10-12 LAB — HEMOCHROMATOSIS DNA-PCR(C282Y,H63D)

## 2016-10-12 NOTE — Telephone Encounter (Signed)
-----   Message from Sherren Kerns, MD sent at 10/12/2016 11:31 AM EDT ----- Regarding: RE: need recomendation Up to her if she wants to cancel that is fine.  Fabienne Bruns ----- Message ----- From: Phillips Odor, RN Sent: 10/12/2016  10:04 AM To: Sherren Kerns, MD Subject: need recomendation                             s/p left first rib resection left subclavian artery stenting and left brachial embolectomy 3/19; seen for post op f/u last Thurs. with c/o freq.headaches.  Called this morning, stating last headache was Friday morning (3/30) and had none over the weekend.  Asking if it is necessary to continue with MRI?

## 2016-10-12 NOTE — Telephone Encounter (Signed)
Done

## 2016-10-12 NOTE — Telephone Encounter (Signed)
Called pt. back with recommendation by Dr. Darrick Penna.  Stated she would like to cancel, as she has been feeling better.  Will have Scheduler contact [facility] for cancellation of MRI.

## 2016-10-12 NOTE — Telephone Encounter (Signed)
-----   Message from Sherren Kerns, MD sent at 10/12/2016 11:32 AM EDT ----- Regarding: RE: Brain MRI Up to her.  If she wants to cancel that is fine  Fabienne Bruns ----- Message ----- From: Jena Gauss Sent: 10/12/2016  10:01 AM To: Sherren Kerns, MD Subject: Brain MRI                                      Hi Dr. Darrick Penna,  You had ordered an MRI Brain for this pt. She called and left a message saying that she had not had any headaches all weekend and wanted to know if you still wanted her to have the test done. (I'm guessing yes)  Thank you, Elon Jester

## 2016-10-12 NOTE — Telephone Encounter (Signed)
Spoke to pt. Pt said that she does not want to go through with the MRI.

## 2016-10-14 DIAGNOSIS — E041 Nontoxic single thyroid nodule: Secondary | ICD-10-CM | POA: Diagnosis not present

## 2016-10-15 ENCOUNTER — Encounter: Payer: BLUE CROSS/BLUE SHIELD | Admitting: Vascular Surgery

## 2016-10-16 ENCOUNTER — Other Ambulatory Visit: Payer: Self-pay | Admitting: Otolaryngology

## 2016-10-16 DIAGNOSIS — E041 Nontoxic single thyroid nodule: Secondary | ICD-10-CM

## 2016-10-21 ENCOUNTER — Telehealth: Payer: Self-pay | Admitting: *Deleted

## 2016-10-21 NOTE — Telephone Encounter (Signed)
Pt returned call regarding lab work.  Staff message from Dr. Candise Che reviewed with patient.  Instructed pt to follow up on ferritin levels with PCP at 6 months and then yearly for 2-3 years.  Instructed pt to follow up with Dr. Candise Che if ferritin levels rise over 500, or if any other concerns arise.  Pt verbalized understanding and was thankful for the call.

## 2016-10-21 NOTE — Telephone Encounter (Signed)
Per staff message, called pt to discuss lab results.  No answer from pt, LVM asking for call back, clinic number provided.

## 2016-10-21 NOTE — Telephone Encounter (Signed)
-----   Message from Johney Maine, MD sent at 10/21/2016 12:33 AM EDT ----- Minerva Ends, Plz let the patient know that her hypercoagulability workup was unrevealing for a thrombophilic status. 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 She has single copy of this hemochromatosis gene and shall likely be an unaffected carrier. It is useful to know this since it might be important to know if her partner is a carrier once she decides to have children. Also rarely she might have an untestable 2nd mutation that could lead to clinically significant elevation of Ferritin levels (though unlikely given her ferritin levels are in the 200's). Would recommend rpt ferritin levels in 6 months and then year for 2-3 year. If levels exceed 500 plz re-consult [place]. Organ injury due to iron overload is rare with ferritin levels <1000. Also would recommend avoid iron replacement.supplements and minimize etoh use. She is vegan so dietary iron intake will little be limited. Let me know if she has any additional questions. thx Wyvonnia Lora MD MS

## 2016-10-22 ENCOUNTER — Ambulatory Visit
Admission: RE | Admit: 2016-10-22 | Discharge: 2016-10-22 | Disposition: A | Discharge status: Home / Self Care | Payer: BLUE CROSS/BLUE SHIELD | Source: Ambulatory Visit | Attending: Otolaryngology | Admitting: Otolaryngology

## 2016-10-22 ENCOUNTER — Other Ambulatory Visit (HOSPITAL_COMMUNITY)
Admission: RE | Admit: 2016-10-22 | Discharge: 2016-10-22 | Disposition: A | Discharge status: Home / Self Care | Payer: BLUE CROSS/BLUE SHIELD | Source: Ambulatory Visit | Attending: Physician Assistant | Admitting: Physician Assistant

## 2016-10-22 DIAGNOSIS — E041 Nontoxic single thyroid nodule: Secondary | ICD-10-CM

## 2016-10-22 NOTE — Procedures (Signed)
PROCEDURE SUMMARY:  Using direct ultrasound guidance, 4 passes were made using 25 g needles into the nodules within the isthmus and the left lobe of the thyroid.   Ultrasound was used to confirm needle placements on all occasions.   Specimens were sent to Pathology for analysis.  Ellis Koffler S Emeril Stille PA-C 10/22/2016 9:34 AM

## 2016-11-04 DIAGNOSIS — N920 Excessive and frequent menstruation with regular cycle: Secondary | ICD-10-CM | POA: Diagnosis not present

## 2016-11-04 DIAGNOSIS — G540 Brachial plexus disorders: Secondary | ICD-10-CM | POA: Diagnosis not present

## 2016-11-04 DIAGNOSIS — Z3009 Encounter for other general counseling and advice on contraception: Secondary | ICD-10-CM | POA: Diagnosis not present

## 2016-11-17 ENCOUNTER — Telehealth: Payer: Self-pay

## 2016-11-17 ENCOUNTER — Ambulatory Visit (INDEPENDENT_AMBULATORY_CARE_PROVIDER_SITE_OTHER): Payer: Self-pay | Admitting: Vascular Surgery

## 2016-11-17 VITALS — BP 131/84 | HR 89 | Temp 98.5°F | Resp 16 | Ht 63.0 in | Wt 146.0 lb

## 2016-11-17 DIAGNOSIS — Z8669 Personal history of other diseases of the nervous system and sense organs: Secondary | ICD-10-CM

## 2016-11-17 DIAGNOSIS — Z5189 Encounter for other specified aftercare: Secondary | ICD-10-CM

## 2016-11-17 NOTE — Progress Notes (Signed)
Patient name: Michelle Villa MRN: [id number] DOB: 09/22/1985 Sex: female  REASON FOR VISIT: add-on  HPI: Michelle Villa is a 31 y.o. female who presents as an add-on today for evaluation of redness at her incisions. She feels like she has a "pimple" on her incision.  She recently underwent left first rib resection, left subclavian artery stenting and left brachial embolectomy for thoracic outlet syndrome with distal embolization. She still reports some pain to the upper portion of her left chest, however this is starting to improve. She still has some coolness to her left hand but this is also getting better. She denies any further headaches. No fever or chills. She had a chest x-ray to rule out pneumothorax. This was negative. She was also scheduled to have a brain arm MRI to rule out stroke given her headaches. Her headaches resolved prior to her MRI appointments and Dr. Darrick Penna said she did not need the MRI anymore.  Current Outpatient Prescriptions  Medication Sig Dispense Refill  . acetaminophen (TYLENOL) 500 MG tablet Take 1,000 mg by mouth every 6 (six) hours as needed for moderate pain.    Marland Kitchen aspirin EC 81 MG tablet Take 81 mg by mouth daily.    . clonazePAM (KLONOPIN) 0.5 MG tablet Take 0.5 mg by mouth daily as needed for anxiety.     . Echinacea 400 MG CAPS Take 400 mg by mouth daily.    . Multiple Vitamins-Minerals (MULTI COMPLETE PO) Take 1 tablet by mouth daily.     Marland Kitchen OVER THE COUNTER MEDICATION Take 1 tablet by mouth at bedtime as needed (sleep). Passion Flower otc sleep aid    . vitamin B-12 (CYANOCOBALAMIN) 1000 MCG tablet Take by mouth.    . clopidogrel (PLAVIX) 75 MG tablet   10  . mupirocin ointment (BACTROBAN) 2 %   0  . OCELLA 3-0.03 MG tablet   10  . traMADol (ULTRAM) 50 MG tablet Take 1-2 tablets (50-100 mg total) by mouth every 6 (six) hours as needed for moderate pain (DEPENDS ON PAIN LEVEL IF TAKES 1-2 TABLETS). (Patient not taking: Reported on 11/17/2016) 30 tablet  0  . vitamin B-12 (CYANOCOBALAMIN) 250 MCG tablet Take 250 mcg by mouth daily.     No current facility-administered medications for this visit.     REVIEW OF SYSTEMS:  [X]  denotes positive finding, [ ]  denotes negative finding Cardiac  Comments:  Chest pain or chest pressure:    Shortness of breath upon exertion:    Short of breath when lying flat:    Irregular heart rhythm:    Constitutional    Fever or chills:      PHYSICAL EXAM: Vitals:   11/17/16 1538  BP: 131/84  Pulse: 89  Resp: 16  Temp: 98.5 F (36.9 C)  SpO2: 100%  Weight: 146 lb (66.2 kg)  Height: 5\' 3"  (1.6 m)    GENERAL: The patient is a well-nourished female, in no acute distress. The vital signs are documented above. VASCULAR: Left hand is warm with strong grip strength. Clavicular incision with not protruding from and an incision. Left brachial incision with mild redness to proximal aspect with not protruding out. Minimal serous drainage.  MEDICAL ISSUES: Suture abscess  The knots that were protruding from the ends of her incisions were cut out today. No antibiotics indicated. She has not had any new headaches. Her left chest symptoms are improving. Her left hand symptoms have significantly improved. She has follow-up with Dr. Darrick Penna in 1 year.  Maris BergerKimberly Amica Harron, PA-C [facility]  [facility] MD: Early

## 2016-11-17 NOTE — Telephone Encounter (Signed)
rec'd call from pt.  Reported a red, raised area of left arm, at antecubital space.  Reported "it looks like a suture has come to the skin surface, and is draining small amt. of pus."  Stated it appeared like a pimple, and now has burst.  Denied fever/ chills.  Requested an exam.  Offered appt. At 2:30 PM; stated she needed a later appt., due to work schedule.  Offered an appt. at 3:30 PM.  Pt. accepted.

## 2016-12-03 ENCOUNTER — Telehealth: Payer: Self-pay | Admitting: *Deleted

## 2016-12-03 NOTE — Telephone Encounter (Signed)
Rec'd call from patient stating that the pain in her arm has returned and is worse while working.  Also stated, her fingers are cold, tingly and numb intermittently. She is doing the physical therapy as directed.  I spoke with Dr Darrick PennaFields, and an appointment was scheduled 12/16/2016.

## 2016-12-14 ENCOUNTER — Encounter: Payer: Self-pay | Admitting: Vascular Surgery

## 2016-12-14 NOTE — Addendum Note (Signed)
Addendum  created 12/14/16 1223 by Lovey Crupi, MD   Sign clinical note    

## 2016-12-16 ENCOUNTER — Ambulatory Visit (INDEPENDENT_AMBULATORY_CARE_PROVIDER_SITE_OTHER): Payer: Self-pay | Admitting: Vascular Surgery

## 2016-12-16 ENCOUNTER — Encounter: Payer: Self-pay | Admitting: Vascular Surgery

## 2016-12-16 VITALS — BP 138/86 | HR 89 | Temp 97.5°F | Resp 16 | Ht 63.0 in | Wt 151.0 lb

## 2016-12-16 DIAGNOSIS — G540 Brachial plexus disorders: Secondary | ICD-10-CM

## 2016-12-16 NOTE — Addendum Note (Signed)
Addended by: Burton ApleyPETTY, Kendle Turbin A on: 12/16/2016 04:58 PM   Modules accepted: Orders

## 2016-12-16 NOTE — Progress Notes (Addendum)
Patient is a 31 year old female who returns for follow-up today. She underwent left first rib resection and stenting of her left subclavian artery (09/28/16) for vascular thoracic outlet syndrome. She had had fairly significant atheroemboli to her left upper extremity. She states she is still having pain at the site of her rib resection and burning and stinging sensation in the supraclavicular incision as well as the left antecubital incision. She still has some cold intolerance in her left hand. However, she also states that she feels her left upper extremity is weaker like it was preoperatively. She feels she has never really completely return to baseline.  Physical exam:  Vitals:   12/16/16 1559  BP: 138/86  Pulse: 89  Resp: 16  Temp: 97.5 F (36.4 C)  SpO2: 96%  Weight: 151 lb (68.5 kg)  Height: 5\' 3"  (1.6 m)    Extremities: Well-healed supraclavicular and left antecubital incision. She has biphasic Doppler flow throughout the left upper extremity with no palpable radial or brachial or ulnar or axillary pulses. 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 I discussed with her that she will probably have some limitations with her left upper extremity long-term hopefully not get any worse. However, since she does not currently have a palpable axillary pulse of believe we need to rule out any re-narrowing or occlusion of her left subclavian stent.  Plan: The patient will be scheduled for CT Angio of the chest to evaluate her left subclavian artery stent. She will call me after the CT has been performed and I'll review the films and then discussed the findings with her.  We will also send her back to physical therapy to see if there is some additional strengthening exercises we can do to improve her overall left upper  extremity function and reduce pain.  Fabienne Brunsharles Kariem Wolfson, MD [facility] Office: [phone] Pager: [phone]

## 2016-12-24 ENCOUNTER — Telehealth: Payer: Self-pay | Admitting: Vascular Surgery

## 2016-12-24 NOTE — Telephone Encounter (Signed)
Per Dr.Fields instructions on 12/16/16 I scheduled a CTA chest for patient on 12/30/16 at the [address] [facility]. She is to arrive at 8:40am for a 9am appointment. No solid foods 4 hours prior and the patient is aware of the above information. I also placed a referral for her to outpatient rehab through [facility] per [facility]'s instructions. awt

## 2016-12-30 ENCOUNTER — Ambulatory Visit
Admission: RE | Admit: 2016-12-30 | Discharge: 2016-12-30 | Disposition: A | Discharge status: Home / Self Care | Payer: BLUE CROSS/BLUE SHIELD | Source: Ambulatory Visit | Attending: Vascular Surgery | Admitting: Vascular Surgery

## 2016-12-30 DIAGNOSIS — G540 Brachial plexus disorders: Secondary | ICD-10-CM | POA: Diagnosis not present

## 2016-12-30 MED ORDER — IOPAMIDOL (ISOVUE-370) INJECTION 76%
75.0000 mL | Freq: Once | INTRAVENOUS | Status: DC | PRN
Start: 2016-12-30 — End: 2016-12-31

## 2017-01-05 ENCOUNTER — Telehealth: Payer: Self-pay | Admitting: Vascular Surgery

## 2017-01-05 NOTE — Telephone Encounter (Signed)
Called patient and discussed findings of CTA 12/30/16.  Widely patent stent.  Artery open to elbow level.  Not seen below that.  First rib resection looks good.  She will call if she has further problems.  Otherwise we will see her in follow up in one year with duplex left subclavian and LUE.  Fabienne Brunsharles Fields

## 2017-01-06 ENCOUNTER — Telehealth: Payer: Self-pay | Admitting: Vascular Surgery

## 2017-01-06 NOTE — Telephone Encounter (Signed)
Per [facility]'s instructions, I scheduled an appt for this patient for 12/23/2017 at 3pm for vascular studies and to see [facility] as well. I was unable to leave a message for the patient so I mailed a reminder letter.awt

## 2017-01-06 NOTE — Telephone Encounter (Signed)
-----   Message from Sherren Kernsharles E Fields, MD sent at 01/05/2017  8:33 AM EDT ----- Regarding: RE: CT results Contact: [phone] Spoke with pt gave results.  Please schedule for appt with me in one year with duplex left subclavian and left upper extremity  Fabienne Brunsharles Fields ----- Message ----- From: Shari Prowsobin, Annette W Sent: 01/04/2017  12:30 PM To: Sherren Kernsharles E Fields, MD Subject: CT results                                     Dr.Fields, This patient called today requesting the results of her recent CT scan done on 12/30/16. She can be reached at the above phone number. I believe this is the patient that you ordered the CTA done and stated you would contact her with the results instead of seeing her back in the office. Thanks, Drinda ButtsAnnette

## 2017-03-03 NOTE — Addendum Note (Signed)
Addendum  created 03/03/17 1237 by Ryane Konieczny, MD   Sign clinical note    

## 2017-05-11 DIAGNOSIS — Z789 Other specified health status: Secondary | ICD-10-CM | POA: Diagnosis not present

## 2017-05-11 DIAGNOSIS — D7589 Other specified diseases of blood and blood-forming organs: Secondary | ICD-10-CM | POA: Diagnosis not present

## 2017-05-11 DIAGNOSIS — N926 Irregular menstruation, unspecified: Secondary | ICD-10-CM | POA: Diagnosis not present

## 2017-05-11 DIAGNOSIS — Z0000 Encounter for general adult medical examination without abnormal findings: Secondary | ICD-10-CM | POA: Diagnosis not present

## 2017-05-11 DIAGNOSIS — Z131 Encounter for screening for diabetes mellitus: Secondary | ICD-10-CM | POA: Diagnosis not present

## 2017-05-11 DIAGNOSIS — Z13220 Encounter for screening for lipoid disorders: Secondary | ICD-10-CM | POA: Diagnosis not present

## 2017-05-20 IMAGING — CT CT ANGIO CHEST
1 series · 19 of 32 positions shown · IV contrast (APPLIED)
Comparison: By report from a angiogram obtained on 09/11/2016

CLINICAL DATA: Known left subclavian artery stenosis, evaluate for
possible aneurysmal dilatation

EXAM:
CT ANGIOGRAPHY CHEST WITH CONTRAST
TECHNIQUE: Multidetector CT imaging of the chest was performed using the
standard protocol during bolus administration of intravenous
contrast. Multiplanar CT image reconstructions and MIPs were
obtained to evaluate the vascular anatomy.
CONTRAST:  75 mL Isovue 370.

[Series 5: chest angio · axial · 0.66mm/px · z∈[-288,-9]mm · 19 of 101 slices shown]
[im 4/101  lung]
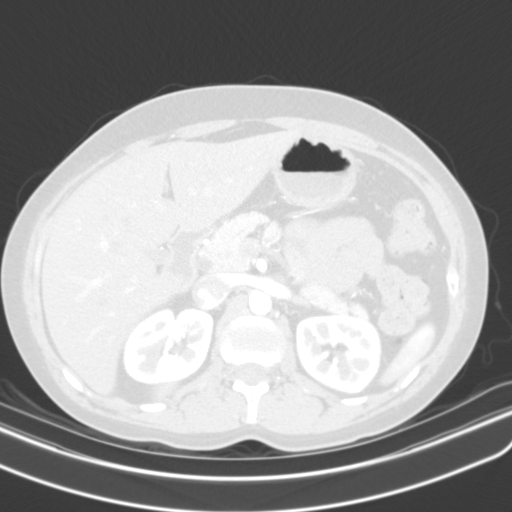
[im 10/101  soft-tissue]
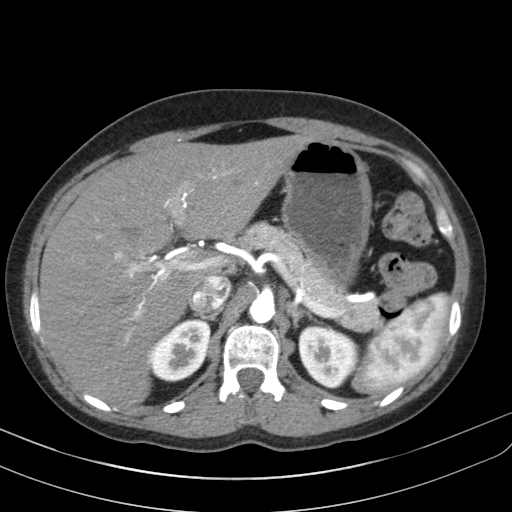
[im 13/101  lung]
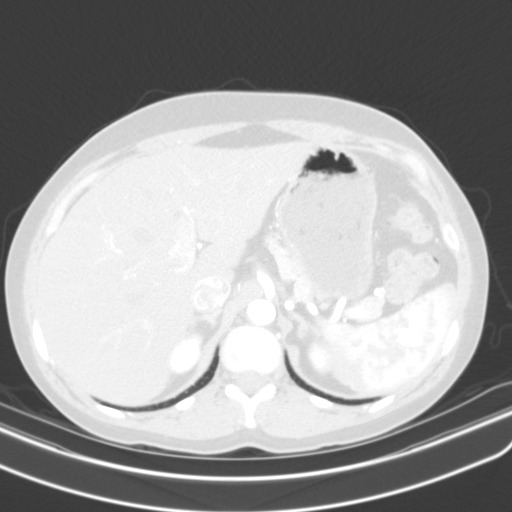
[im 20/101  soft-tissue]
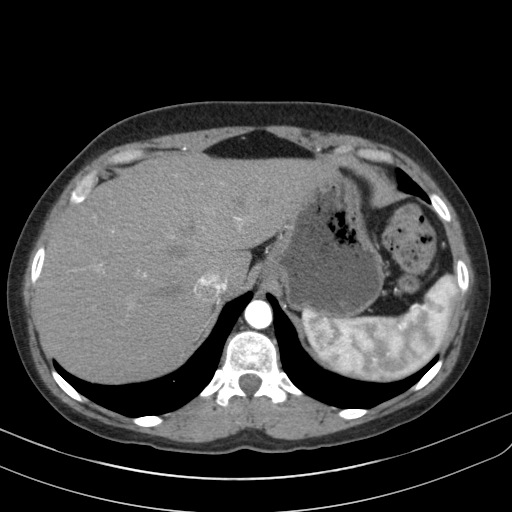
[im 26/101  lung]
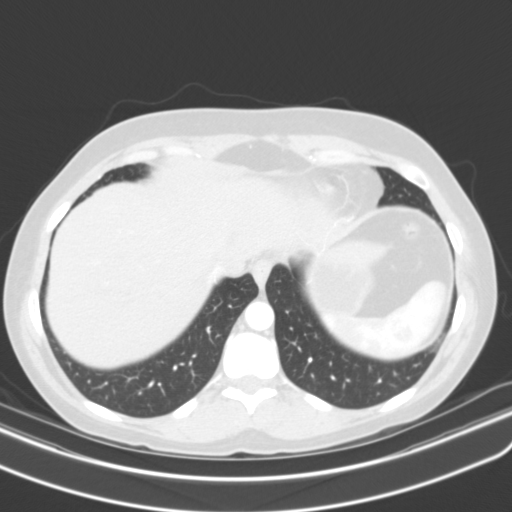
[im 30/101  soft-tissue]
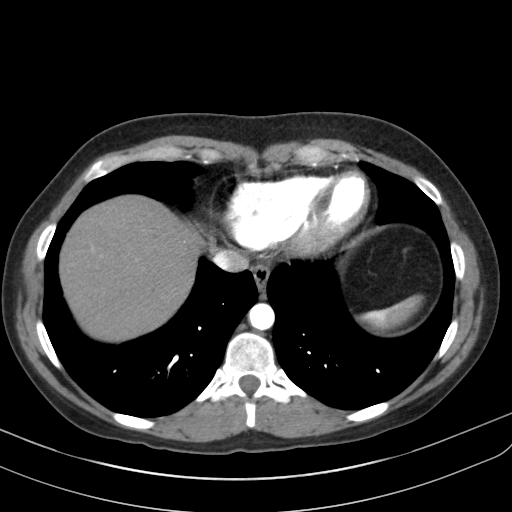
[im 36/101  lung]
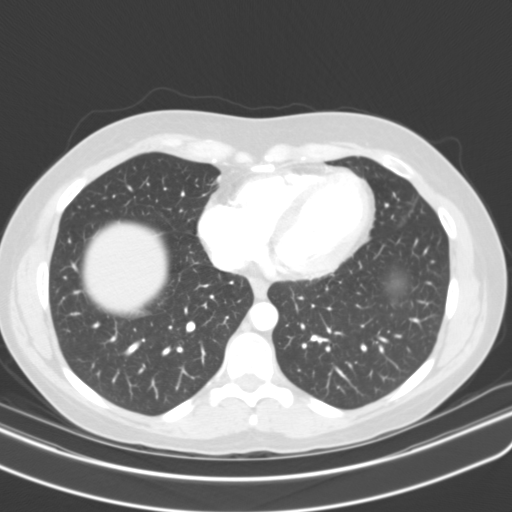
[im 39/101  soft-tissue]
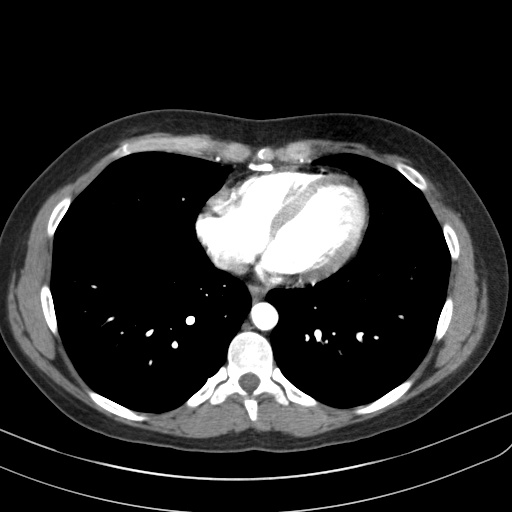
[im 46/101  lung]
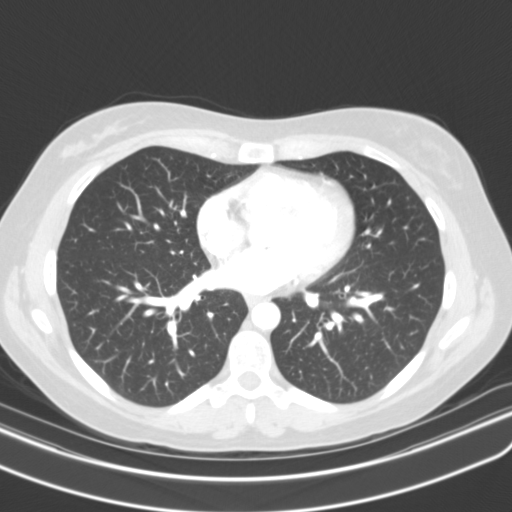
[im 52/101  soft-tissue]
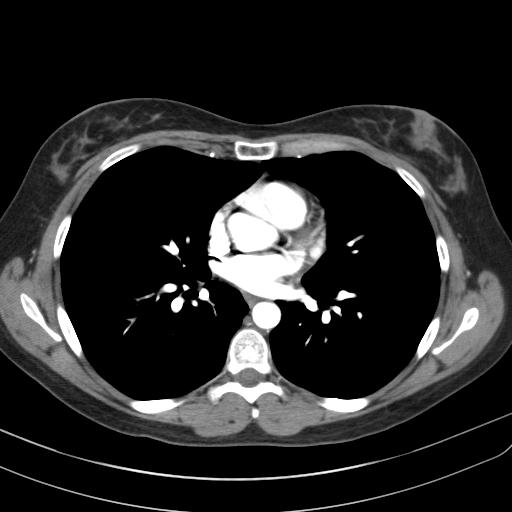
[im 55/101  lung]
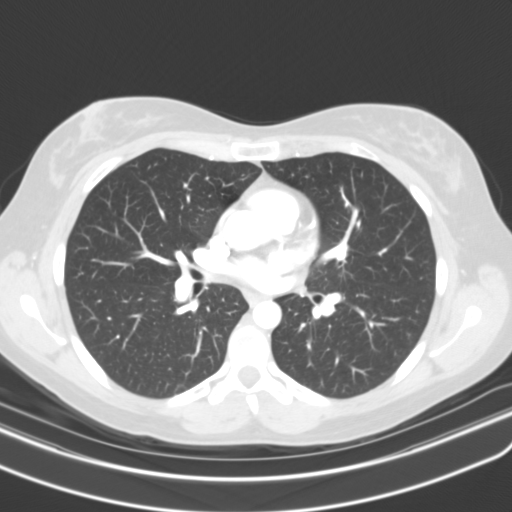
[im 62/101  soft-tissue]
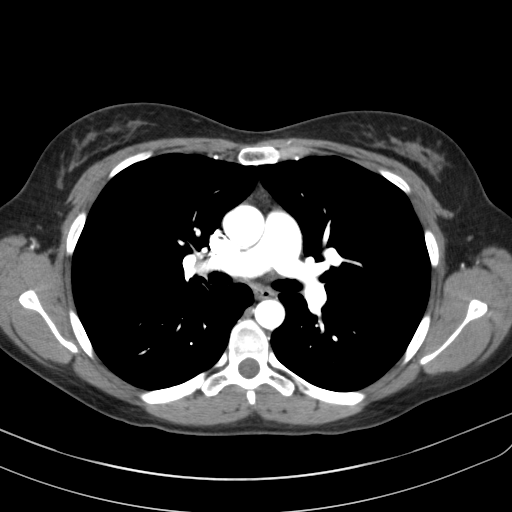
[im 65/101  lung]
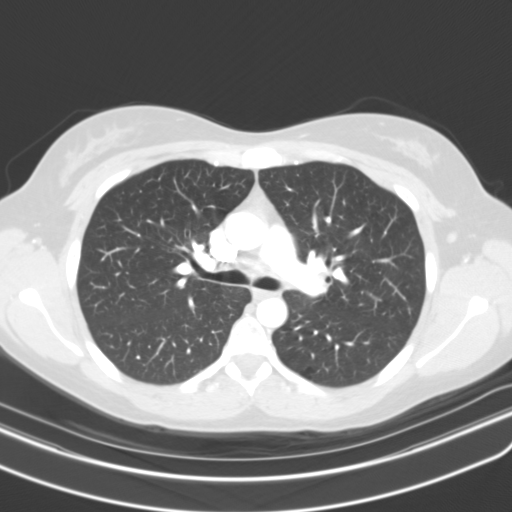
[im 71/101  soft-tissue]
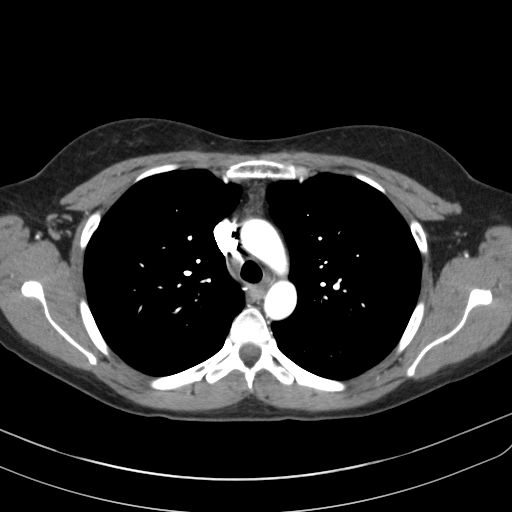
[im 75/101  lung]
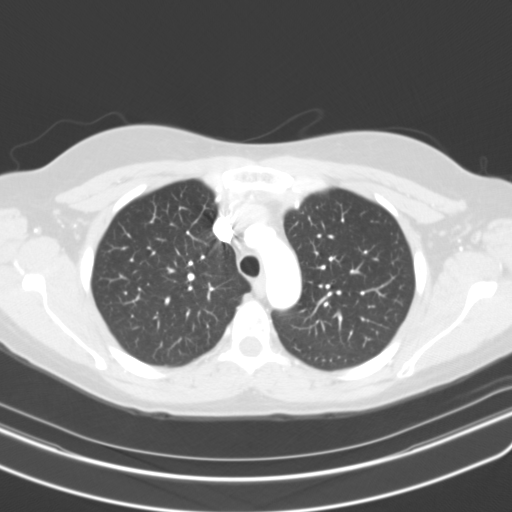
[im 81/101  soft-tissue]
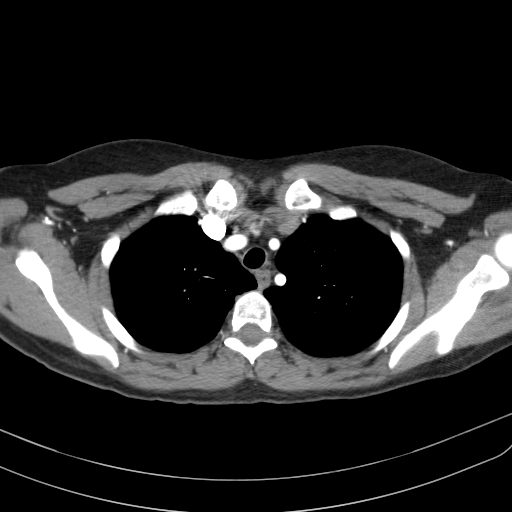
[im 88/101  lung]
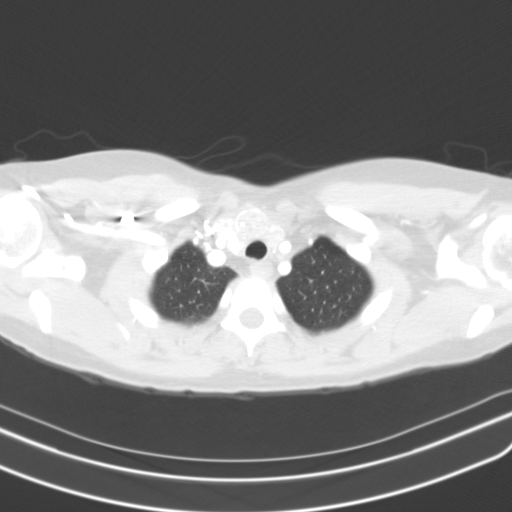
[im 91/101  soft-tissue]
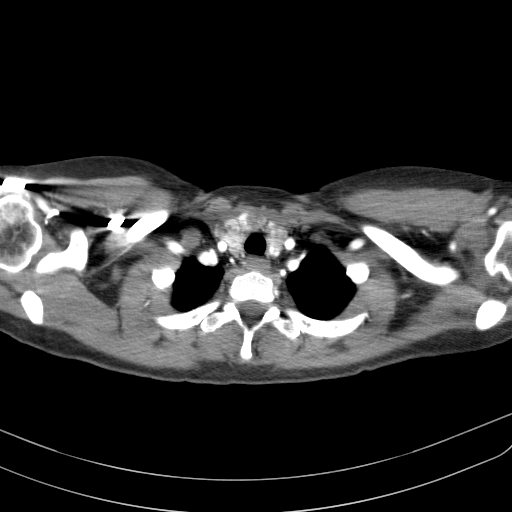
[im 97/101  lung]
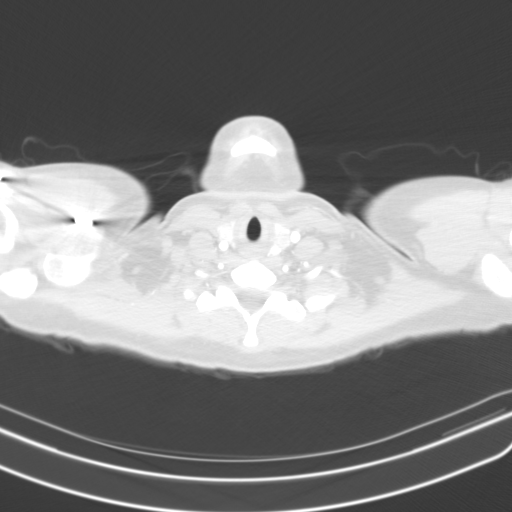

[19 of 32 positions shown; findings below may reference images not displayed]

FINDINGS: Cardiovascular: Thoracic aorta is within normal limits without
aneurysmal dilatation or significant atherosclerotic calcification.
The origins of the brachiocephalic vessels are within normal limits.
The carotid arteries and right subclavian artery as visualized are
within normal limits as well. The left subclavian demonstrates a
normal origin with normal origin of the vertebral artery. At the
level of the first costoclavicular space there is a focal 60%
stenosis of the vessel related to noncalcified plaque. This is best
visualized on the coronal imaging number 57 of series 8. The
subclavian artery distal to this into the axillary artery is widely
patent. No definitive ulceration is noted on these images. No
aneurysmal dilatation is identified. Pulmonary artery as visualized
is within normal limits. No significant cardiac enlargement is
noted. No definitive atrial thrombus is seen.

Mediastinum/Nodes: No hilar or mediastinal adenopathy is noted. The
thoracic inlet demonstrates evidence of multinodular goiter. A
dominant nodule is noted within the isthmus measuring approximately
2.4 cm in greatest dimension with peripheral calcifications.

Lungs/Pleura: Lungs are well aerated bilaterally. No focal
infiltrate or sizable effusion is seen. No nodular changes are
noted.

Upper Abdomen: Visualized upper abdomen reveals no focal
abnormality.

Musculoskeletal: No acute abnormality noted.

Review of the MIP images confirms the above findings.
IMPRESSION: 60% stenosis in the mid left subclavian artery secondary to
eccentric noncalcified plaque. This lies at the first
costoclavicular space.

Changes in the thyroid with a dominant isthmic nodule. Ultrasound
evaluation is recommended for further evaluation

No other focal abnormality is seen.

## 2017-06-25 IMAGING — US US THYROID BIOPSY
1 series · 13 of 24 positions shown · non-contrast
Comparison: Ultrasound done

MEDICATIONS:
None

COMPLICATIONS:
None immediate.

INDICATION: Indeterminate isthmus and left thyroid nodules

EXAM:
ULTRASOUND GUIDED FINE NEEDLE ASPIRATION OF INDETERMINATE THYROID
NODULES
TECHNIQUE: Informed written consent was obtained from the patient after a
discussion of the risks, benefits and alternatives to treatment.
Questions regarding the procedure were encouraged and answered. A
timeout was performed prior to the initiation of the procedure.

[Series 1: us thyroid biopsy · 0.06mm/px · 24 acquisitions, 13 frames shown]
[im 1/24]
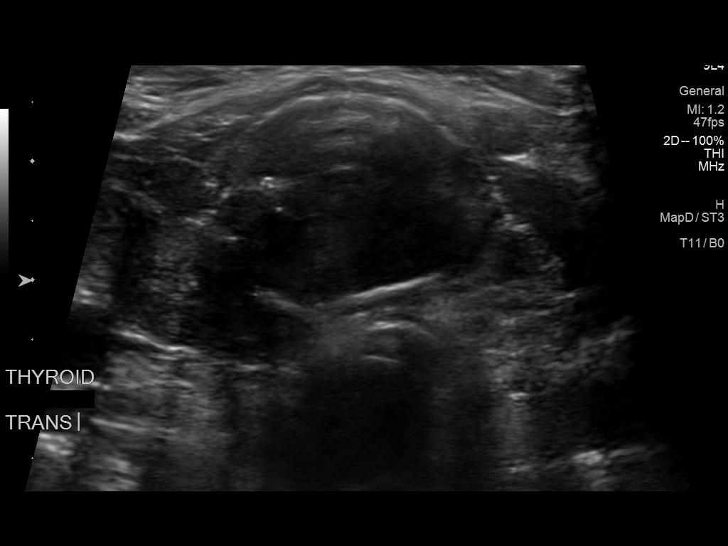
[im 3/24]
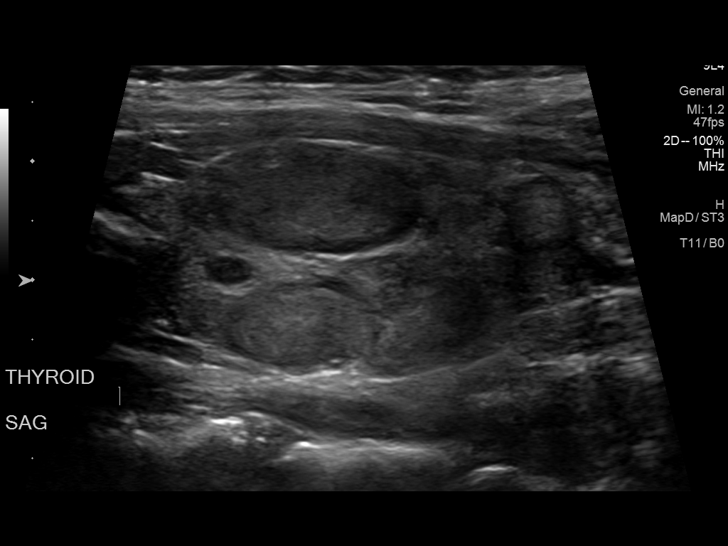
[im 5/24]
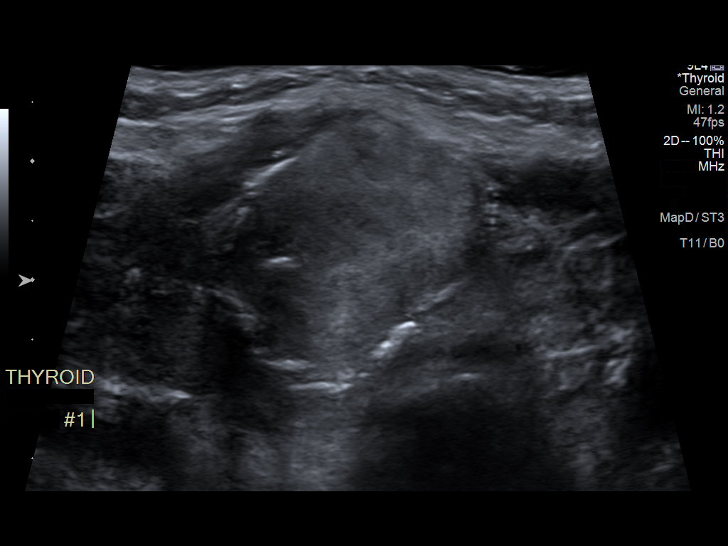
[im 7/24]
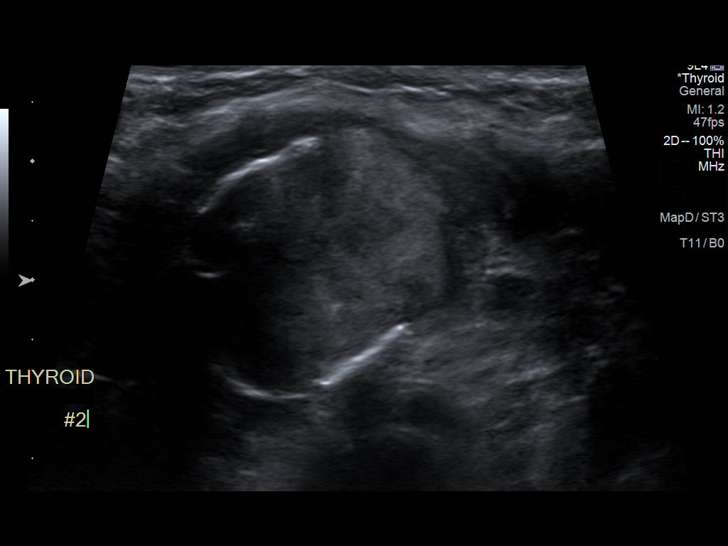
[im 9/24]
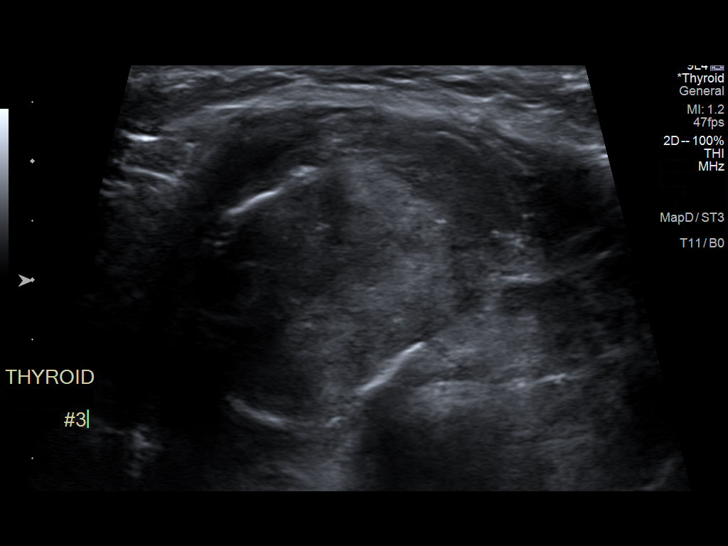
[im 11/24]
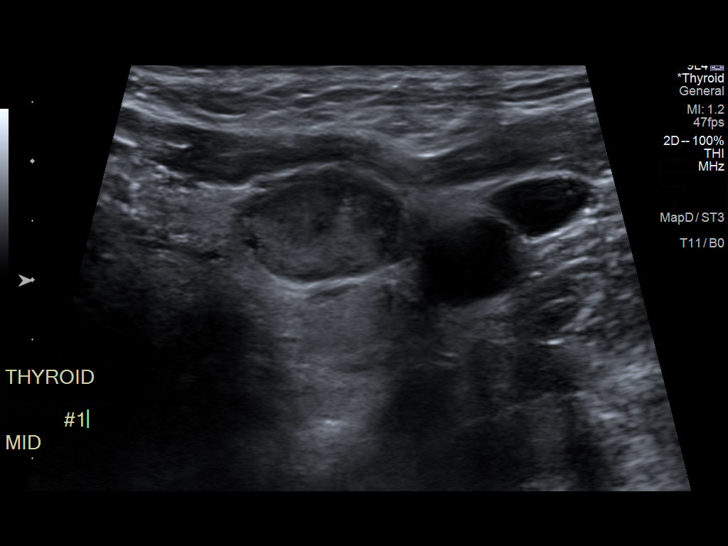
[im 13/24]
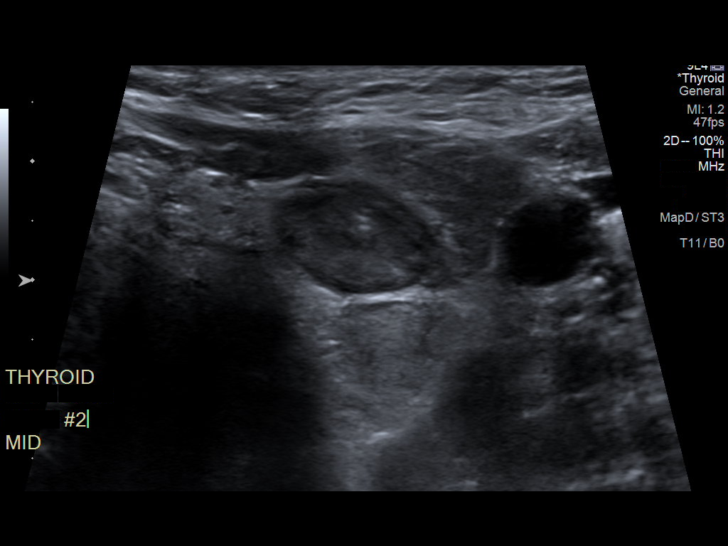
[im 14/24]
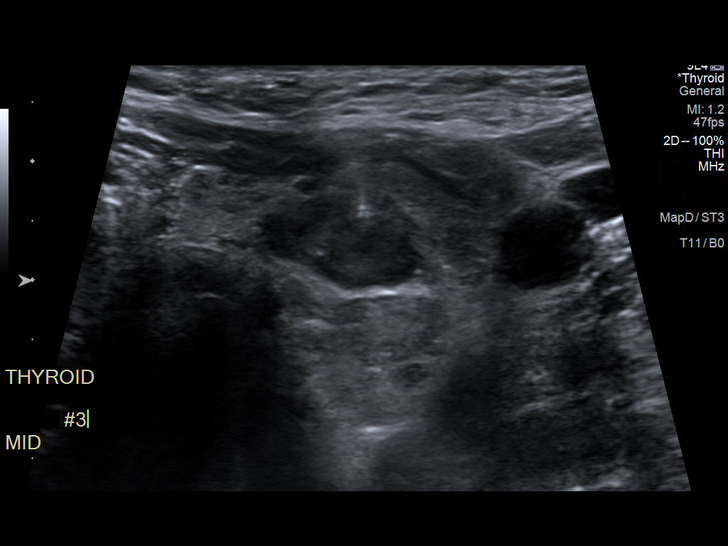
[im 16/24]
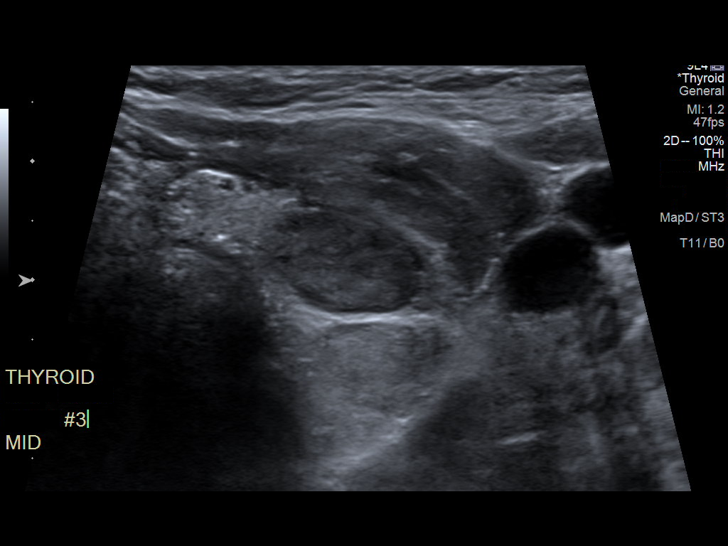
[im 18/24]
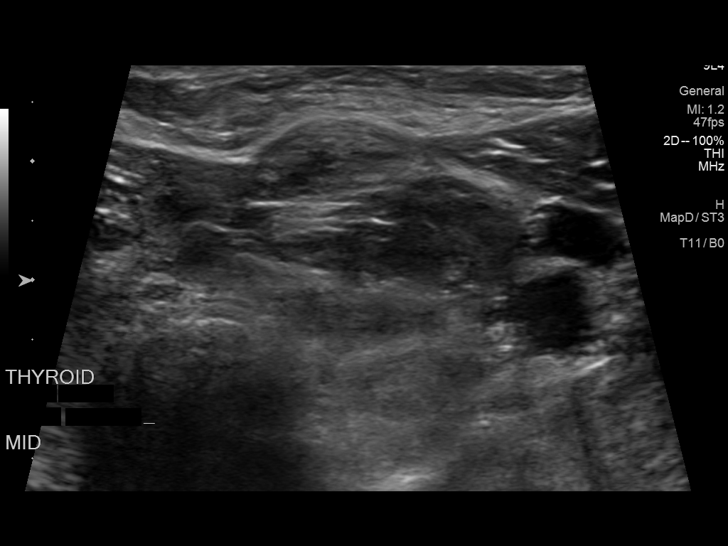
[im 20/24]
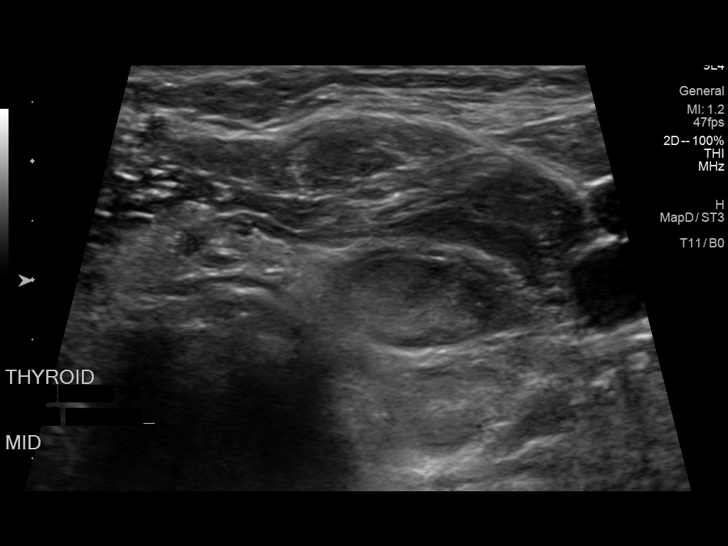
[im 22/24]
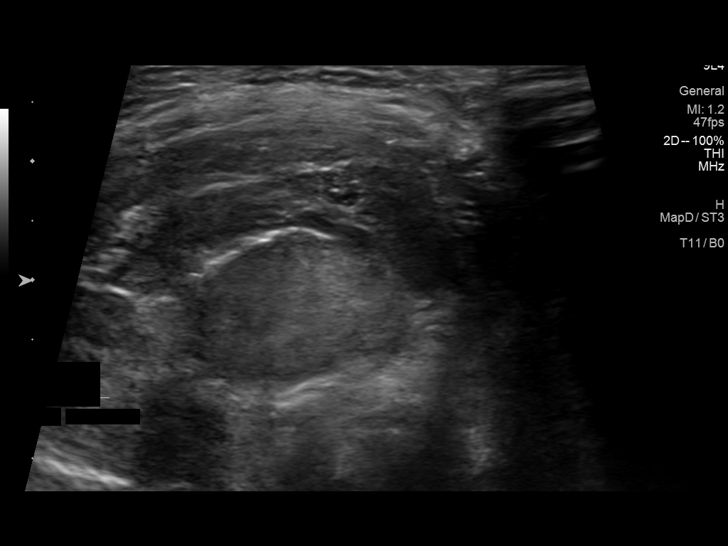
[im 24/24]
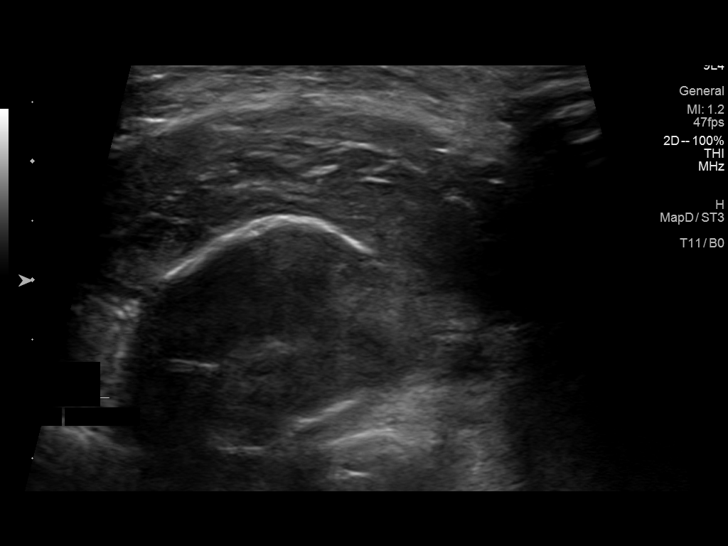

[13 of 24 positions shown; findings below may reference images not displayed]

Pre-procedural ultrasound scanning demonstrated unchanged size and
appearance of the indeterminate nodules within the

The procedure was planned. The neck was prepped in the usual sterile
fashion, and a sterile drape was applied covering the operative
field. A timeout was performed prior to the initiation of the
procedure. Local anesthesia was provided with 1% lidocaine.

Under direct ultrasound guidance, 3 FNA biopsies were performed of
the isthmus nodule with a 25 gauge needle. Multiple ultrasound
images were saved for procedural documentation purposes. The samples
were prepared and submitted to pathology.

Under direct ultrasound guidance, 3 FNA biopsies were performed of
the left mid thyroid nodule with a 25 gauge needle. Multiple
ultrasound images were saved for procedural documentation purposes.
The samples were prepared and submitted to pathology.

Limited post procedural scanning was negative for hematoma or
additional complication. Dressings were placed. The patient
tolerated the above procedures procedure well without immediate
postprocedural complication.
FINDINGS: FINDINGS
Nodule reference number based on prior diagnostic ultrasound: 1

Maximum size:  2.7 cm

Location: Isthmus; Superior

ACR TI-RADS risk category: TR4 (4-6 points)

Reason for biopsy: meets ACR TI-RADS criteria

_________________________________________________________

Nodule reference number based on prior diagnostic ultrasound: 3

Maximum size:  2 cm

Location: Left; Mid

ACR TI-RADS risk category: TR4 (4-6 points)

Reason for biopsy: meets ACR TI-RADS criteria

Ultrasound imaging confirms appropriate placement of the needles
within the thyroid nodule.
IMPRESSION: 1. Technically successful ultrasound guided fine needle aspiration
of the isthmus nodule.
2. Technically successful ultrasound guided fine needle aspiration
of the left mid thyroid nodule.

## 2017-07-08 DIAGNOSIS — R21 Rash and other nonspecific skin eruption: Secondary | ICD-10-CM | POA: Diagnosis not present

## 2017-12-10 ENCOUNTER — Other Ambulatory Visit: Payer: Self-pay

## 2017-12-10 DIAGNOSIS — G540 Brachial plexus disorders: Secondary | ICD-10-CM

## 2017-12-23 ENCOUNTER — Ambulatory Visit: Payer: BLUE CROSS/BLUE SHIELD | Admitting: Vascular Surgery

## 2017-12-23 ENCOUNTER — Other Ambulatory Visit (HOSPITAL_COMMUNITY): Payer: BLUE CROSS/BLUE SHIELD

## 2018-02-10 ENCOUNTER — Other Ambulatory Visit: Payer: Self-pay

## 2018-02-10 ENCOUNTER — Encounter: Payer: Self-pay | Admitting: Vascular Surgery

## 2018-02-10 ENCOUNTER — Ambulatory Visit: Payer: BLUE CROSS/BLUE SHIELD | Admitting: Vascular Surgery

## 2018-02-10 ENCOUNTER — Ambulatory Visit (HOSPITAL_COMMUNITY)
Admission: RE | Admit: 2018-02-10 | Discharge: 2018-02-10 | Disposition: A | Discharge status: Home / Self Care | Payer: BLUE CROSS/BLUE SHIELD | Source: Ambulatory Visit | Attending: Vascular Surgery | Admitting: Vascular Surgery

## 2018-02-10 VITALS — BP 126/88 | HR 62 | Temp 97.7°F | Resp 14 | Ht 63.0 in | Wt 149.0 lb

## 2018-02-10 DIAGNOSIS — G540 Brachial plexus disorders: Secondary | ICD-10-CM | POA: Insufficient documentation

## 2018-02-11 NOTE — Progress Notes (Signed)
Patient is a 32 year old female who returns for follow-up today.  She underwent left first rib resection and stenting of her left subclavian artery March 2018.  This was done for vascular thoracic outlet syndrome with arterial embolization to the left hand.  She states that most of her postoperative pain has resolved at this point except for some sensitivity around the incision.  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  She feels like that most of the function has returned to her left arm and left hand and has minimal symptoms.  However, she is still not completely normal in the left arm.  She is working full-time at [employer].  She is on Plavix and aspirin.  Review of systems: She denies shortness of breath.  She denies chest pain.  Past Medical History:  Diagnosis Date  . Anxiety   . Raynaud phenomenon    Past Surgical History:  Procedure Laterality Date  . AORTIC ARCH ANGIOGRAPHY N/A 09/11/2016   Procedure: Aortic Arch Angiography;  Surgeon: Sherren Kerns, MD;  Location: [facility] INVASIVE CV LAB;  Service: Cardiovascular;  Laterality: N/A;  . EMBOLECTOMY Left 09/28/2016   Procedure: Left Brachial, radial, ulnar and intraosseous artery thrombectomy;  Surgeon: Sherren Kerns, MD;  Location: [facility] OR;  Service: Vascular;  Laterality: Left;  . I&D EXTREMITY Left 09/28/2016   Procedure: EVACUATION HEMATOMA NECK;  Surgeon: Sherren Kerns, MD;  Location: [facility] OR;  Service: Vascular;  Laterality: Left;  . PATCH ANGIOPLASTY Left 09/28/2016   Procedure: PATCH ANGIOPLASTY BRACHIAL ARTERY USING VIEN HARVESTED FROM PATIENT;  Surgeon: Sherren Kerns, MD;  Location: [facility] OR;  Service: Vascular;  Laterality: Left;  . PERIPHERAL VASCULAR INTERVENTION Left 09/28/2016   Procedure: LEFT SUBCLAVIAN STENT;  Surgeon: Sherren Kerns, MD;  Location: [facility] OR;  Service: Vascular;  Laterality: Left;  . RIB RESECTION Left  09/28/2016   Procedure: FIRST RIB RESECTION;  Surgeon: Sherren Kerns, MD;  Location: [facility] OR;  Service: Vascular;  Laterality: Left;  . UPPER EXTREMITY ANGIOGRAPHY N/A 09/11/2016   Procedure: Upper Extremity Angiography - Left;  Surgeon: Sherren Kerns, MD;  Location: [facility] INVASIVE CV LAB;  Service: Cardiovascular;  Laterality: N/A;  . WISDOM TOOTH EXTRACTION      Current Outpatient Medications on File Prior to Visit  Medication Sig Dispense Refill  . aspirin EC 81 MG tablet Take 81 mg by mouth daily.    . Echinacea 400 MG CAPS Take 400 mg by mouth daily.    . Multiple Vitamin (MULTIVITAMIN) capsule Take by mouth.    . Multiple Vitamins-Minerals (MULTI COMPLETE PO) Take 1 tablet by mouth daily.     Marland Kitchen OVER THE COUNTER MEDICATION Take 1 tablet by mouth at bedtime as needed (sleep). Passion Flower otc sleep aid    . vitamin B-12 (CYANOCOBALAMIN) 1000 MCG tablet Take 2,000 mcg by mouth.     Marland Kitchen acetaminophen (TYLENOL) 500 MG tablet Take 1,000 mg by mouth every 6 (six) hours as needed for moderate pain.    . clonazePAM (KLONOPIN) 0.5 MG tablet Take 0.5 mg by mouth daily as needed for anxiety.     . clopidogrel (PLAVIX) 75 MG tablet   10  . mupirocin ointment (BACTROBAN) 2 %   0  . OCELLA 3-0.03 MG tablet   10  . traMADol (ULTRAM) 50 MG tablet Take 1-2 tablets (50-100 mg total)  by mouth every 6 (six) hours as needed for moderate pain (DEPENDS ON PAIN LEVEL IF TAKES 1-2 TABLETS). (Patient not taking: Reported on 11/17/2016) 30 tablet 0   No current facility-administered medications on file prior to visit.    Physical exam:  Vitals:   02/10/18 1051  BP: 126/88  Pulse: 62  Resp: 14  Temp: 97.7 F (36.5 C)  TempSrc: Oral  Weight: 149 lb (67.6 kg)  Height: 5\' 3"  (1.6 m)    Neuro: Symmetric upper extremity strength 5/5  Vascular: No palpable left radial pulse 2+ right radial pulse  Data: Patient had an arterial duplex exam of her left upper extremity today.  The stent was widely patent with  triphasic waveforms.  There was triphasic flow in the brachial radial and ulnar arteries in the left arm.  Assessment: Minimal symptoms left upper extremity at this point.  Adequate perfusion to the left hand.  She did have distal digital embolization but seems to have compensated with for this with collateralization.  Plan: Follow-up 1 year repeat duplex of her left upper extremity.  She will call us sooner if she develops any recurrent symptoms.  She will continue her aspirin and Plavix.  Fabienne Brunsharles Fields, MD [facility] Office: [phone] Pager: [phone]

## 2018-03-01 DIAGNOSIS — F331 Major depressive disorder, recurrent, moderate: Secondary | ICD-10-CM | POA: Diagnosis not present

## 2018-03-24 DIAGNOSIS — Z23 Encounter for immunization: Secondary | ICD-10-CM | POA: Diagnosis not present

## 2018-04-04 DIAGNOSIS — F418 Other specified anxiety disorders: Secondary | ICD-10-CM | POA: Diagnosis not present

## 2018-06-07 DIAGNOSIS — S61219A Laceration without foreign body of unspecified finger without damage to nail, initial encounter: Secondary | ICD-10-CM | POA: Diagnosis not present

## 2018-06-20 DIAGNOSIS — Z0000 Encounter for general adult medical examination without abnormal findings: Secondary | ICD-10-CM | POA: Diagnosis not present

## 2018-09-07 DIAGNOSIS — J111 Influenza due to unidentified influenza virus with other respiratory manifestations: Secondary | ICD-10-CM | POA: Diagnosis not present

## 2018-10-10 DIAGNOSIS — N939 Abnormal uterine and vaginal bleeding, unspecified: Secondary | ICD-10-CM | POA: Diagnosis not present

## 2018-10-17 DIAGNOSIS — N9489 Other specified conditions associated with female genital organs and menstrual cycle: Secondary | ICD-10-CM | POA: Diagnosis not present

## 2018-10-17 DIAGNOSIS — N939 Abnormal uterine and vaginal bleeding, unspecified: Secondary | ICD-10-CM | POA: Diagnosis not present

## 2018-12-14 DIAGNOSIS — N939 Abnormal uterine and vaginal bleeding, unspecified: Secondary | ICD-10-CM | POA: Diagnosis not present

## 2018-12-14 DIAGNOSIS — N915 Oligomenorrhea, unspecified: Secondary | ICD-10-CM | POA: Diagnosis not present

## 2018-12-14 DIAGNOSIS — N949 Unspecified condition associated with female genital organs and menstrual cycle: Secondary | ICD-10-CM | POA: Diagnosis not present

## 2018-12-21 NOTE — Pre-Procedure Instructions (Signed)
Michelle Villa  12/21/2018      [facility] [address] - [place], [place] - [address] [place] [address] Phone: [phone] Fax: [phone]    Your procedure is scheduled on 12/28/18.  [facility] Admitting at 945 A.M.  Call this number if you have problems the morning of surgery:  [phone]   Remember:  Do not eat or drink after midnight.     Take these medicines the morning of surgery with A SIP OF WATER ---TYLENOL,XANAX,LEXAPRO    Do not wear jewelry, make-up or nail polish.  Do not wear lotions, powders, or perfumes, or deodorant.  Do not shave 48 hours prior to surgery.  Men may shave face and neck.  Do not bring valuables to the hospital.  [facility] any belongings or valuables.  Contacts, dentures or bridgework may not be worn into surgery.  Leave your suitcase in the car.  After surgery it may be brought to your room.  For patients admitted to the hospital, discharge time will be determined by your treatment team.  Patients discharged the day of surgery will not be allowed to drive home.   Special instructions: --Do not take any aspirin,anti-inflammatories,vitamins,or herbal supplements 5-7 days prior to surgery.  Please read over the following fact sheets that you were given. [facility] - Preparing for Surgery  Before surgery, you can play an important role.  Because skin is not sterile, your skin needs to be as free of germs as possible.  You can reduce the number of germs on you skin by washing with CHG (chlorahexidine gluconate) soap before surgery.  CHG is an antiseptic cleaner which kills germs and bonds with the skin to continue killing germs even after washing.  Oral Hygiene is also important in reducing the risk of infection.  Remember to brush your teeth with your regular toothpaste the morning of surgery.  Please DO NOT use if you have an allergy to CHG or  antibacterial soaps.  If your skin becomes reddened/irritated stop using the CHG and inform your nurse when you arrive at Short Stay.  Do not shave (including legs and underarms) for at least 48 hours prior to the first CHG shower.  You may shave your face.  Please follow these instructions carefully:   1.  Shower with CHG Soap the night before surgery and the morning of Surgery.  2.  If you choose to wash your hair, wash your hair first as usual with your normal shampoo.  3.  After you shampoo, rinse your hair and body thoroughly to remove the shampoo. 4.  Use CHG as you would any other liquid soap.  You can apply chg directly to the skin and wash gently with a      scrungie or washcloth.           5.  Apply the CHG Soap to your body ONLY FROM THE NECK DOWN.   Do not use on open wounds or open sores. Avoid contact with your eyes, ears, mouth and genitals (private parts).  Wash genitals (private parts) with your normal soap.  6.  Wash thoroughly, paying special attention to the area where your surgery will be performed.  7.  Thoroughly rinse your body with warm water from the neck down.  8.  DO NOT shower/wash with your normal soap after using and rinsing off the CHG Soap.  9.  Pat yourself dry with  a clean towel.            10.  Wear clean pajamas.            11.  Place clean sheets on your bed the night of your first shower and do not sleep with pets.  Day of Surgery  Do not apply any lotions/deoderants the morning of surgery.   Please wear clean clothes to the hospital/surgery center. Remember to brush your teeth with toothpaste.

## 2018-12-22 ENCOUNTER — Other Ambulatory Visit: Payer: Self-pay | Admitting: Obstetrics and Gynecology

## 2018-12-22 ENCOUNTER — Other Ambulatory Visit: Payer: Self-pay

## 2018-12-22 ENCOUNTER — Encounter (HOSPITAL_COMMUNITY)
Admission: RE | Admit: 2018-12-22 | Discharge: 2018-12-22 | Disposition: A | Discharge status: Home / Self Care | Payer: BC Managed Care – PPO | Source: Ambulatory Visit | Attending: Obstetrics and Gynecology | Admitting: Obstetrics and Gynecology

## 2018-12-22 ENCOUNTER — Encounter (HOSPITAL_COMMUNITY): Payer: Self-pay

## 2018-12-22 DIAGNOSIS — Z01812 Encounter for preprocedural laboratory examination: Secondary | ICD-10-CM | POA: Diagnosis not present

## 2018-12-22 HISTORY — DX: Depression, unspecified: F32.A

## 2018-12-22 HISTORY — DX: Nontoxic single thyroid nodule: E04.1

## 2018-12-22 LAB — CBC
HCT: 39.2 % (ref 36.0–46.0)
Hemoglobin: 13 g/dL (ref 12.0–15.0)
MCH: 35.9 pg — ABNORMAL HIGH (ref 26.0–34.0)
MCHC: 33.2 g/dL (ref 30.0–36.0)
MCV: 108.3 fL — ABNORMAL HIGH (ref 80.0–100.0)
Platelets: 183 10*3/uL (ref 150–400)
RBC: 3.62 MIL/uL — ABNORMAL LOW (ref 3.87–5.11)
RDW: 14 % (ref 11.5–15.5)
WBC: 5.3 10*3/uL (ref 4.0–10.5)
nRBC: 0 % (ref 0.0–0.2)

## 2018-12-22 NOTE — Progress Notes (Signed)
PCP - [facility] -   Chest x-ray - n/a EKG - n/a Stress Test - n/a ECHO - n/a Cardiac Cath - angiograph- 2018  Sleep Study - n/a CPAP - n/a  Fasting Blood Sugar - n/a Checks Blood Sugar _____ times a day  Blood Thinner Instructions:n/a Aspirin Instructions: last dose 12/20/2018  Anesthesia review: n/a  Patient denies shortness of breath, fever, cough and chest pain at PAT appointment   Patient verbalized understanding of instructions that were given to them at the PAT appointment. Patient was also instructed that they will need to review over the PAT instructions again at home before surgery.

## 2018-12-24 ENCOUNTER — Other Ambulatory Visit (HOSPITAL_COMMUNITY)
Admission: RE | Admit: 2018-12-24 | Discharge: 2018-12-24 | Disposition: A | Discharge status: Home / Self Care | Payer: BC Managed Care – PPO | Source: Ambulatory Visit | Attending: Obstetrics and Gynecology | Admitting: Obstetrics and Gynecology

## 2018-12-24 DIAGNOSIS — Z1159 Encounter for screening for other viral diseases: Secondary | ICD-10-CM | POA: Insufficient documentation

## 2018-12-25 LAB — NOVEL CORONAVIRUS, NAA (HOSP ORDER, SEND-OUT TO REF LAB; TAT 18-24 HRS): SARS-CoV-2, NAA: NOT DETECTED

## 2018-12-28 ENCOUNTER — Encounter (HOSPITAL_COMMUNITY)
Admission: RE | Disposition: A | Discharge status: Home / Self Care | Payer: Self-pay | Source: Home / Self Care | Attending: Obstetrics and Gynecology

## 2018-12-28 ENCOUNTER — Ambulatory Visit (HOSPITAL_COMMUNITY): Payer: BC Managed Care – PPO | Admitting: Registered Nurse

## 2018-12-28 ENCOUNTER — Ambulatory Visit (HOSPITAL_COMMUNITY)
Admission: RE | Admit: 2018-12-28 | Discharge: 2018-12-28 | Disposition: A | Discharge status: Home / Self Care | Payer: BC Managed Care – PPO | Attending: Obstetrics and Gynecology | Admitting: Obstetrics and Gynecology

## 2018-12-28 ENCOUNTER — Other Ambulatory Visit: Payer: Self-pay | Admitting: Obstetrics and Gynecology

## 2018-12-28 ENCOUNTER — Other Ambulatory Visit: Payer: Self-pay

## 2018-12-28 DIAGNOSIS — N8302 Follicular cyst of left ovary: Secondary | ICD-10-CM | POA: Diagnosis not present

## 2018-12-28 DIAGNOSIS — F410 Panic disorder [episodic paroxysmal anxiety] without agoraphobia: Secondary | ICD-10-CM | POA: Insufficient documentation

## 2018-12-28 DIAGNOSIS — N949 Unspecified condition associated with female genital organs and menstrual cycle: Secondary | ICD-10-CM | POA: Diagnosis not present

## 2018-12-28 DIAGNOSIS — Z79899 Other long term (current) drug therapy: Secondary | ICD-10-CM | POA: Insufficient documentation

## 2018-12-28 DIAGNOSIS — Z7982 Long term (current) use of aspirin: Secondary | ICD-10-CM | POA: Insufficient documentation

## 2018-12-28 DIAGNOSIS — F419 Anxiety disorder, unspecified: Secondary | ICD-10-CM | POA: Insufficient documentation

## 2018-12-28 DIAGNOSIS — Z8249 Family history of ischemic heart disease and other diseases of the circulatory system: Secondary | ICD-10-CM | POA: Diagnosis not present

## 2018-12-28 DIAGNOSIS — N946 Dysmenorrhea, unspecified: Secondary | ICD-10-CM | POA: Diagnosis not present

## 2018-12-28 DIAGNOSIS — R1909 Other intra-abdominal and pelvic swelling, mass and lump: Secondary | ICD-10-CM | POA: Diagnosis not present

## 2018-12-28 DIAGNOSIS — Z808 Family history of malignant neoplasm of other organs or systems: Secondary | ICD-10-CM | POA: Diagnosis not present

## 2018-12-28 DIAGNOSIS — I998 Other disorder of circulatory system: Secondary | ICD-10-CM | POA: Diagnosis not present

## 2018-12-28 DIAGNOSIS — N920 Excessive and frequent menstruation with regular cycle: Secondary | ICD-10-CM | POA: Diagnosis not present

## 2018-12-28 DIAGNOSIS — F329 Major depressive disorder, single episode, unspecified: Secondary | ICD-10-CM | POA: Insufficient documentation

## 2018-12-28 DIAGNOSIS — G540 Brachial plexus disorders: Secondary | ICD-10-CM | POA: Diagnosis not present

## 2018-12-28 DIAGNOSIS — F418 Other specified anxiety disorders: Secondary | ICD-10-CM | POA: Diagnosis not present

## 2018-12-28 DIAGNOSIS — N840 Polyp of corpus uteri: Secondary | ICD-10-CM | POA: Insufficient documentation

## 2018-12-28 DIAGNOSIS — N83292 Other ovarian cyst, left side: Secondary | ICD-10-CM | POA: Insufficient documentation

## 2018-12-28 HISTORY — PX: HYSTEROSCOPY: SHX211

## 2018-12-28 HISTORY — PX: LAPAROSCOPY: SHX197

## 2018-12-28 LAB — TYPE AND SCREEN
ABO/RH(D): O POS
Antibody Screen: NEGATIVE

## 2018-12-28 SURGERY — LAPAROSCOPY OPERATIVE
Anesthesia: General | Site: Vagina

## 2018-12-28 MED ORDER — CEFAZOLIN SODIUM-DEXTROSE 2-4 GM/100ML-% IV SOLN
INTRAVENOUS | Status: AC
  Filled 2018-12-28: qty 100

## 2018-12-28 MED ORDER — ONDANSETRON HCL 4 MG/2ML IJ SOLN
INTRAMUSCULAR | Status: DC | PRN
  Administered 2018-12-28: 4 mg via INTRAVENOUS

## 2018-12-28 MED ORDER — LIDOCAINE 2% (20 MG/ML) 5 ML SYRINGE
INTRAMUSCULAR | Status: DC | PRN
  Administered 2018-12-28: 80 mg via INTRAVENOUS

## 2018-12-28 MED ORDER — PHENYLEPHRINE 40 MCG/ML (10ML) SYRINGE FOR IV PUSH (FOR BLOOD PRESSURE SUPPORT)
PREFILLED_SYRINGE | INTRAVENOUS | Status: DC | PRN
  Administered 2018-12-28: 80 ug via INTRAVENOUS

## 2018-12-28 MED ORDER — FENTANYL CITRATE (PF) 100 MCG/2ML IJ SOLN
INTRAMUSCULAR | Status: AC
  Filled 2018-12-28: qty 2

## 2018-12-28 MED ORDER — MIDAZOLAM HCL 2 MG/2ML IJ SOLN
INTRAMUSCULAR | Status: AC
  Filled 2018-12-28: qty 2

## 2018-12-28 MED ORDER — FENTANYL CITRATE (PF) 250 MCG/5ML IJ SOLN
INTRAMUSCULAR | Status: AC
  Filled 2018-12-28: qty 5

## 2018-12-28 MED ORDER — ACETAMINOPHEN 500 MG PO TABS
1000.0000 mg | ORAL_TABLET | ORAL | Status: AC
  Administered 2018-12-28: 1000 mg via ORAL

## 2018-12-28 MED ORDER — ONDANSETRON HCL 4 MG/2ML IJ SOLN
INTRAMUSCULAR | Status: AC
  Filled 2018-12-28: qty 2

## 2018-12-28 MED ORDER — BUPIVACAINE HCL (PF) 0.25 % IJ SOLN
INTRAMUSCULAR | Status: AC
  Filled 2018-12-28: qty 30

## 2018-12-28 MED ORDER — DEXAMETHASONE SODIUM PHOSPHATE 10 MG/ML IJ SOLN
INTRAMUSCULAR | Status: AC
  Filled 2018-12-28: qty 1

## 2018-12-28 MED ORDER — OXYCODONE HCL 5 MG PO TABS
5.0000 mg | ORAL_TABLET | Freq: Once | ORAL | Status: AC | PRN
  Administered 2018-12-28: 5 mg via ORAL

## 2018-12-28 MED ORDER — DIPHENHYDRAMINE HCL 50 MG/ML IJ SOLN
INTRAMUSCULAR | Status: DC | PRN
  Administered 2018-12-28: 12.5 mg via INTRAVENOUS

## 2018-12-28 MED ORDER — PROPOFOL 10 MG/ML IV BOLUS
INTRAVENOUS | Status: AC
  Filled 2018-12-28: qty 20

## 2018-12-28 MED ORDER — PROMETHAZINE HCL 25 MG/ML IJ SOLN: 6.2500 mg | INTRAMUSCULAR | Status: DC | PRN

## 2018-12-28 MED ORDER — PHENYLEPHRINE 40 MCG/ML (10ML) SYRINGE FOR IV PUSH (FOR BLOOD PRESSURE SUPPORT)
PREFILLED_SYRINGE | INTRAVENOUS | Status: AC
  Filled 2018-12-28: qty 10

## 2018-12-28 MED ORDER — LACTATED RINGERS IV SOLN
INTRAVENOUS | Status: DC
  Administered 2018-12-28 (×2): via INTRAVENOUS

## 2018-12-28 MED ORDER — SUGAMMADEX SODIUM 200 MG/2ML IV SOLN
INTRAVENOUS | Status: DC | PRN
  Administered 2018-12-28 (×2): 100 mg via INTRAVENOUS

## 2018-12-28 MED ORDER — FENTANYL CITRATE (PF) 100 MCG/2ML IJ SOLN
25.0000 ug | INTRAMUSCULAR | Status: DC | PRN
  Administered 2018-12-28: 14:00:00 50 ug via INTRAVENOUS

## 2018-12-28 MED ORDER — FENTANYL CITRATE (PF) 100 MCG/2ML IJ SOLN
INTRAMUSCULAR | Status: DC | PRN
  Administered 2018-12-28 (×3): 50 ug via INTRAVENOUS

## 2018-12-28 MED ORDER — ACETAMINOPHEN 10 MG/ML IV SOLN: 1000.0000 mg | Freq: Once | INTRAVENOUS | Status: DC | PRN

## 2018-12-28 MED ORDER — CEFAZOLIN SODIUM-DEXTROSE 2-4 GM/100ML-% IV SOLN
2.0000 g | INTRAVENOUS | Status: AC
  Administered 2018-12-28: 2 g via INTRAVENOUS

## 2018-12-28 MED ORDER — OXYCODONE HCL 5 MG/5ML PO SOLN: 5.0000 mg | Freq: Once | ORAL | Status: AC | PRN

## 2018-12-28 MED ORDER — ROCURONIUM BROMIDE 50 MG/5ML IV SOSY
PREFILLED_SYRINGE | INTRAVENOUS | Status: DC | PRN
  Administered 2018-12-28: 70 mg via INTRAVENOUS

## 2018-12-28 MED ORDER — IBUPROFEN 800 MG PO TABS: 800.0000 mg | ORAL_TABLET | Freq: Three times a day (TID) | ORAL | 1 refills | Status: DC | PRN

## 2018-12-28 MED ORDER — SUCCINYLCHOLINE CHLORIDE 200 MG/10ML IV SOSY
PREFILLED_SYRINGE | INTRAVENOUS | Status: AC
  Filled 2018-12-28: qty 10

## 2018-12-28 MED ORDER — BUPIVACAINE HCL (PF) 0.25 % IJ SOLN
INTRAMUSCULAR | Status: DC | PRN
  Administered 2018-12-28: 20 mL
  Administered 2018-12-28: 30 mL

## 2018-12-28 MED ORDER — ROCURONIUM BROMIDE 10 MG/ML (PF) SYRINGE
PREFILLED_SYRINGE | INTRAVENOUS | Status: AC
  Filled 2018-12-28: qty 10

## 2018-12-28 MED ORDER — DIPHENHYDRAMINE HCL 50 MG/ML IJ SOLN
INTRAMUSCULAR | Status: AC
  Filled 2018-12-28: qty 1

## 2018-12-28 MED ORDER — SODIUM CHLORIDE 0.9 % IR SOLN
Status: DC | PRN
  Administered 2018-12-28: 3000 mL

## 2018-12-28 MED ORDER — OXYCODONE HCL 5 MG PO TABS
ORAL_TABLET | ORAL | Status: AC
  Filled 2018-12-28: qty 1

## 2018-12-28 MED ORDER — MIDAZOLAM HCL 5 MG/5ML IJ SOLN
INTRAMUSCULAR | Status: DC | PRN
  Administered 2018-12-28: 2 mg via INTRAVENOUS

## 2018-12-28 MED ORDER — ACETAMINOPHEN 500 MG PO TABS
ORAL_TABLET | ORAL | Status: AC
  Filled 2018-12-28: qty 2

## 2018-12-28 MED ORDER — LIDOCAINE 2% (20 MG/ML) 5 ML SYRINGE
INTRAMUSCULAR | Status: AC
  Filled 2018-12-28: qty 5

## 2018-12-28 MED ORDER — PROPOFOL 10 MG/ML IV BOLUS
INTRAVENOUS | Status: DC | PRN
  Administered 2018-12-28: 140 mg via INTRAVENOUS

## 2018-12-28 MED ORDER — DEXAMETHASONE SODIUM PHOSPHATE 10 MG/ML IJ SOLN
INTRAMUSCULAR | Status: DC | PRN
  Administered 2018-12-28: 5 mg via INTRAVENOUS

## 2018-12-28 MED ORDER — OXYCODONE HCL 5 MG PO TABS: ORAL_TABLET | ORAL | 0 refills | Status: DC

## 2018-12-28 SURGICAL SUPPLY — 40 items
ADH SKN CLS APL DERMABOND .7 (GAUZE/BANDAGES/DRESSINGS) ×3
BAG SPEC RTRVL LRG 6X4 10 (ENDOMECHANICALS) ×3
BIPOLAR CUTTING LOOP 21FR (ELECTRODE)
CATH ROBINSON RED A/P 16FR (CATHETERS) ×5 IMPLANT
DERMABOND ADVANCED (GAUZE/BANDAGES/DRESSINGS) ×1
DERMABOND ADVANCED .7 DNX12 (GAUZE/BANDAGES/DRESSINGS) ×2 IMPLANT
DILATOR CANAL MILEX (MISCELLANEOUS) ×3 IMPLANT
DRSG OPSITE POSTOP 3X4 (GAUZE/BANDAGES/DRESSINGS) ×2 IMPLANT
ELECT REM PT RETURN 9FT ADLT (ELECTROSURGICAL) ×4
ELECTRODE REM PT RTRN 9FT ADLT (ELECTROSURGICAL) ×4 IMPLANT
GAUZE VASELINE 3X9 (GAUZE/BANDAGES/DRESSINGS) ×2 IMPLANT
GLOVE BIOGEL M 6.5 STRL (GLOVE) ×14 IMPLANT
GLOVE BIOGEL PI IND STRL 6.5 (GLOVE) ×5 IMPLANT
GLOVE BIOGEL PI IND STRL 7.0 (GLOVE) ×5 IMPLANT
GLOVE BIOGEL PI INDICATOR 6.5 (GLOVE) ×2
GLOVE BIOGEL PI INDICATOR 7.0 (GLOVE) ×2
GOWN STRL REUS W/ TWL LRG LVL3 (GOWN DISPOSABLE) ×9 IMPLANT
GOWN STRL REUS W/TWL LRG LVL3 (GOWN DISPOSABLE) ×12
HIBICLENS CHG 4% 4OZ BTL (MISCELLANEOUS) ×2 IMPLANT
KIT PROCEDURE FLUENT (KITS) ×5 IMPLANT
KIT TURNOVER KIT B (KITS) ×7 IMPLANT
LOOP CUTTING BIPOLAR 21FR (ELECTRODE) IMPLANT
PACK LAPAROSCOPY BASIN (CUSTOM PROCEDURE TRAY) ×2 IMPLANT
PACK TRENDGUARD 450 HYBRID PRO (MISCELLANEOUS) ×1 IMPLANT
PACK VAGINAL MINOR WOMEN LF (CUSTOM PROCEDURE TRAY) ×5 IMPLANT
PAD OB MATERNITY 4.3X12.25 (PERSONAL CARE ITEMS) ×5 IMPLANT
POUCH SPECIMEN RETRIEVAL 10MM (ENDOMECHANICALS) ×2 IMPLANT
PROTECTOR NERVE ULNAR (MISCELLANEOUS) ×2 IMPLANT
SET IRRIG TUBING LAPAROSCOPIC (IRRIGATION / IRRIGATOR) ×3 IMPLANT
SET TUBE SMOKE EVAC HIGH FLOW (TUBING) ×2 IMPLANT
SHEARS HARMONIC ACE PLUS 36CM (ENDOMECHANICALS) ×2 IMPLANT
SUT VIC AB 4-0 PS2 27 (SUTURE) ×2 IMPLANT
SUT VICRYL 0 UR6 27IN ABS (SUTURE) ×2 IMPLANT
TOWEL GREEN STERILE FF (TOWEL DISPOSABLE) ×10 IMPLANT
TRAY FOLEY W/BAG SLVR 14FR (SET/KITS/TRAYS/PACK) ×2 IMPLANT
TRENDGUARD 450 HYBRID PRO PACK (MISCELLANEOUS) ×4
TROCAR BLADELESS 11MM (ENDOMECHANICALS) ×3 IMPLANT
TROCAR BLADELESS 5MM (ENDOMECHANICALS) ×4 IMPLANT
TROCAR XCEL NON-BLD 11X100MML (ENDOMECHANICALS) ×2 IMPLANT
WARMER LAPAROSCOPE (MISCELLANEOUS) ×2 IMPLANT

## 2018-12-28 NOTE — Transfer of Care (Signed)
Immediate Anesthesia Transfer of Care Note  Patient: Michelle Villa  Procedure(s) Performed: Operative Laparoscopy with left oophorectomy (Left Abdomen) HYSTEROSCOPY/D&C (N/A Vagina )  Patient Location: PACU  Anesthesia Type:General  Level of Consciousness: awake, alert  and oriented  Airway & Oxygen Therapy: Patient Spontanous Breathing  Post-op Assessment: Report given to RN and Post -op Vital signs reviewed and stable  Post vital signs: Reviewed and stable  Last Vitals:  Vitals Value Taken Time  BP 117/64 12/28/18 1330  Temp    Pulse    Resp 18 12/28/18 1330  SpO2    Vitals shown include unvalidated device data.  Last Pain:  Vitals:   12/28/18 0954  TempSrc: Oral         Complications: No apparent anesthesia complications

## 2018-12-28 NOTE — H&P (Signed)
Subjective: Chief Complaint(s):   Abnormal Uterine Bleeding/ Left Adnexal Mass   HPI:  Isolation Precautions 1. Is fever present / reported?: No, 2. Are respiratory illness symptom(s) present / reported?: No, 3. Are other symptom(s) present / reported?: No, 5. Has there been reported travel to a High Risk respiratory illness region?: No, 6. Has close* contact with person(s) known to have communicable illness been reported?: No, 7. Did travel or close contact (if applicable) occur within 14 days of symptom onset?: No" label="Respiratory Illness Screening" propId="[id number]" catId="[id number]" encId="[id number]"Respiratory Illness Screening 1. Is fever present / reported? No, 2. Are respiratory illness symptom(s) present / reported? No, 3. Are other symptom(s) present / reported? No, 5. Has there been reported travel to a High Risk respiratory illness region? No, 6. Has close* contact with person(s) known to have communicable illness been reported? No, 7. Did travel or close contact (if applicable) occur within 14 days of symptom onset? No.  General 33 y/o presents for f/u of left adnexal mass and irregular menses.  u/s , 12/14/2018, revealed her uterus was normal measuring at 8.8 x 4.4 x 5.0cm. Right ovary appeared normal. Previous right ovarian cyst has resolved. Left ovary not visualized. Left adnexa area measures 5.4 x 5.3cm it contains a cystic area measuring 3.5cm. It has increased in size from previous u/s on 10/17/2018. On 10/17/2018 it measured 4.9 x 3.8 x 2.8cm.  Pt. denies experiencing pain in her left lower quadrant.  Pt. states that her menses are irregular. LMP was 11/2018. Discussed that upon review of u/s the endometrial lining was thickened. Discussed performing hysteroscopy d&c in addition to laparoscopic removal of left adnexal mass. Current Medication: Taking Alprazolam 1 MG Tablet 1/2-1 tablet Orally Twice a day prn severe anxiety. Escitalopram Oxalate 10 MG Tablet 1.5 tablet Orally Once a  day. Aspirin 81 MG Tablet Delayed Release 1 tablet Orally Once a day. Benadryl Allergy(diphenhydrAMINE HCl) 25 MG Tablet 1 tablet as needed Orally every 8 hrs, Notes: prn. Multivitamins Tablet as directed Orally once a day. Vitamin B 12 2000 1 tablet Orally once a day. Medication List reviewed and reconciled with the patient. Medical History:  menometrorrhagia/dysmenorrhea     ASCUS, HPV negative 05/2008, 2012; ASCUS HPV (+) -2014     VEGAN     anxiety     depression     Panic attacks      Allergies/Intolerance:  N.K.D.A.   Gyn History:  Sexual activity currently sexually active, number of sex partners in life, 2. Periods : every month. LMP 11/24/18. Birth control condoms. Last pap smear date 08/29/2015-neg. Denies Last mammogram date N/A. Abnormal pap smear ASCUS, HPV (-) 2009, 2012; ASCUS/HPV (+)- 2014; PAP 2015 (-), 08/24/14.   OB History:  Never been pregnant per patient.   Surgical History:  wisdom teeth extraction 06/2009     Partial rib removal - left 09/2016     Removed blood clot from left arm 09/2016   Hospitalization:  Not in the past year 12/2018   Family History:  Father: alive 6 yrs, Vitamin D deficiency, obese, diagnosed with Hypertension    Mother: alive 35 yrs, depression    Paternal [place] Father: deceased, died of MI, Coronary artery disease    Paternal [place] Mother: deceased, died of 72 of old age, skin cancer    Maternal Grand Father: alive    Maternal Grand Mother: alive, skin cancer    Brother 1: alive 28 yrs, healthy, mental health issuses-schizophrenia   Positive fam. hx skin ca- unsure  if melanoma. Neg. breast, ovarian or colon ca. No hx of premature CAD/CVA.  Social History: General EXPOSURE TO PASSIVE SMOKE: yes, mother & step-dad smokes.  Alcohol: yes, 5-8 drinks per week.  Children: none.  Caffeine: yes, 2-3 servings per day coffee.  DIET: vegan.  EDUCATION: yes, Some College.  Seat belt use: yes.  Tobacco use cigarettes: Never  smoked, Tobacco history last updated 12/14/2018.  Marital Status: married, Married.  no Recreational drug use.  OCCUPATION: employed, [employer].  Exercise: yes, gym 3 times per week.   ROS: CONSTITUTIONAL No" label="Chills" value="" options="no,yes" propid="91" itemid="193425" categoryid="10464" encounterid="[id number]"Chills No. No" label="Fatigue" value="" options="no,yes" propid="91" itemid="172899" categoryid="10464" encounterid="[id number]"Fatigue No. No" label="Fever" value="" options="no,yes" propid="91" itemid="10467" categoryid="10464" encounterid="[id number]"Fever No. No" label="Night sweats" value="" options="no,yes" propid="91" itemid="193426" categoryid="10464" encounterid="[id number]"Night sweats No. No" label="Recent travel outside [place]" value="" options="no,yes" propid="91" itemid="444261" categoryid="10464" encounterid="[id number]"Recent travel outside [place] No. No" label="Sweats" value="" options="no,yes" propid="91" itemid="193427" categoryid="10464" encounterid="[id number]"Sweats No. No" label="Weight change" value="" options="no,yes" propid="91" itemid="194825" categoryid="10464" encounterid="[id number]"Weight change No.  OPHTHALMOLOGY no" label="Blurring of vision" value="" options="no,yes" propid="91" itemid="12520" categoryid="12516" encounterid="[id number]"Blurring of vision no. no" label="Change in vision" value="" options="no,yes" propid="91" itemid="193469" categoryid="12516" encounterid="[id number]"Change in vision no. no" label="Double vision" value="" options="no,yes" propid="91" itemid="194379" categoryid="12516" encounterid="[id number]"Double vision no.  ENT no" label="Dizziness" value="" options="no,yes" propid="91" itemid="193612" categoryid="10481" encounterid="[id number]"Dizziness no. Nose bleeds no. Sore throat no. Teeth pain no.  ALLERGY no" label="Hives" value="" options="no,yes" propid="91" itemid="202589" categoryid="138152" encounterid="[id number]"Hives no.  CARDIOLOGY no"  label="Chest pain" value="" options="no,yes" propid="91" itemid="193603" categoryid="10488" encounterid="[id number]"Chest pain no. no" label="High blood pressure" value="" options="no,yes" propid="91" itemid="199089" categoryid="10488" encounterid="[id number]"High blood pressure no. no" label="Irregular heart beat" value="" options="no,yes" propid="91" itemid="202598" categoryid="10488" encounterid="[id number]"Irregular heart beat no. no" label="Leg edema" value="" options="no,yes" propid="91" itemid="10491" categoryid="10488" encounterid="[id number]"Leg edema no. no" label="Palpitations" value="" options="no,yes" propid="91" itemid="10490" categoryid="10488" encounterid="[id number]"Palpitations no.  RESPIRATORY no" label="Shortness of breath" value="" options="no" propid="91" itemid="270013" categoryid="138132" encounterid="[id number]"Shortness of breath no. no" label="Cough" value="" options="no,yes" propid="91" itemid="172745" categoryid="138132" encounterid="[id number]"Cough no. no" label="Wheezing" value="" options="no,yes" propid="91" itemid="193621" categoryid="138132" encounterid="[id number]"Wheezing no.  UROLOGY no" label="Pain with urination" value="" options="no,yes" propid="91" itemid="194377" categoryid="138166" encounterid="[id number]"Pain with urination no. no" label="Urinary urgency" value="" options="no,yes" propid="91" itemid="193493" categoryid="138166" encounterid="[id number]"Urinary urgency no. no" label="Urinary frequency" value="" options="no,yes" propid="91" itemid="193492" categoryid="138166" encounterid="[id number]"Urinary frequency no. no" label="Urinary incontinence" value="" options="no,yes" propid="91" itemid="138171" categoryid="138166" encounterid="[id number]"Urinary incontinence no. No" label="Difficulty urinating" value="" options="no,yes" propid="91" itemid="138167" categoryid="138166" encounterid="[id number]"Difficulty urinating No. No" label="Blood in urine" value="" options="no,yes" propid="91"  itemid="138168" categoryid="138166" encounterid="[id number]"Blood in urine No.  GASTROENTEROLOGY no" label="Abdominal pain" value="" options="no,yes" propid="91" itemid="10496" categoryid="10494" encounterid="[id number]"Abdominal pain no. no" label="Appetite change" value="" options="no,yes" propid="91" itemid="193447" categoryid="10494" encounterid="[id number]"Appetite change no. no" label="Bloating/belching" value="" options="no,yes" propid="91" itemid="193448" categoryid="10494" encounterid="[id number]"Bloating/belching no. no" label="Blood in stool or on toilet paper" value="" options="no,yes" propid="91" itemid="10503" categoryid="10494" encounterid="[id number]"Blood in stool or on toilet paper no. no" label="Change in bowel movements" value="" options="no,yes" propid="91" itemid="199106" categoryid="10494" encounterid="[id number]"Change in bowel movements no. no" label="Constipation" value="" options="no,yes" propid="91" itemid="10501" categoryid="10494" encounterid="[id number]"Constipation no. no" label="Diarrhea" value="" options="no,yes" propid="91" itemid="10502" categoryid="10494" encounterid="[id number]"Diarrhea no. no" label="Difficulty swallowing" value="" options="no,yes" propid="91" itemid="199104" categoryid="10494" encounterid="[id number]"Difficulty swallowing no. no" label="Nausea" value="" options="no,yes" propid="91" itemid="10499" categoryid="10494" encounterid="[id number]"Nausea no.  FEMALE REPRODUCTIVE no" label="Vulvar pain" value="" options="no,yes" propid="91" itemid="453725" categoryid="10525" encounterid="[id number]"Vulvar pain no. no" label="Vulvar rash" value="" options="no,yes" propid="91" itemid="453726" categoryid="10525" encounterid="[id number]"Vulvar rash no. yes" label="Abnormal vaginal bleeding" value="" options="no, yes" propid="91" itemid="444315" categoryid="10525" encounterid="[id number]"Abnormal vaginal bleeding yes. no" label="Breast pain" value="" options="no,yes" propid="91" itemid="186083"  categoryid="10525" encounterid="[id number]"Breast pain no. no" label="Nipple discharge" value="" options="no,yes" propid="91" itemid="186084" categoryid="10525" encounterid="[id number]"Nipple discharge no. no" label="Pain with intercourse" value="" options="no,yes" propid="91" itemid="275823" categoryid="10525" encounterid="[id number]"Pain with intercourse no. no" label="Pelvic pain" value="" options="no,yes" propid="91" itemid="186082" categoryid="10525" encounterid="[id number]"Pelvic pain no.  no" label="Unusual vaginal discharge" value="" options="no,yes" propid="91" itemid="278230" categoryid="10525" encounterid="[id number]"Unusual vaginal discharge no. no" label="Vaginal itching" value="" options="no,yes" propid="91" itemid="278942" categoryid="10525" encounterid="[id number]"Vaginal itching no.  MUSCULOSKELETAL no" label="Muscle aches" value="" options="no,yes" propid="91" itemid="193461" categoryid="10514" encounterid="[id number]"Muscle aches no.  NEUROLOGY no" label="Headache" value="" options="no,yes" propid="91" itemid="12513" categoryid="12512" encounterid="[id number]"Headache no. no" label="Tingling/numbness" value="" options="no,yes" propid="91" itemid="12514" categoryid="12512" encounterid="[id number]"Tingling/numbness no. no" label="Weakness" value="" options="no,yes" propid="91" itemid="193468" categoryid="12512" encounterid="[id number]"Weakness no.  PSYCHOLOGY no" label="Depression" value="" options="" propid="91" itemid="275919" categoryid="10520" encounterid="[id number]"Depression no. yes" label="Anxiety" value="" options="no,yes" propid="91" itemid="172748" categoryid="10520" encounterid="[id number]"Anxiety yes. no" label="Nervousness" value="" options="no,yes" propid="91" itemid="199158" categoryid="10520" encounterid="[id number]"Nervousness no. no" label="Sleep disturbances" value="" options="no,yes" propid="91" itemid="12502" categoryid="10520" encounterid="[id number]"Sleep disturbances no. no " label="Suicidal  ideation" value="" options="no,yes" propid="91" itemid="72718" categoryid="10520" encounterid="[id number]"Suicidal ideation no .  ENDOCRINOLOGY no" label="Excessive thirst" value="" options="no,yes" propid="91" itemid="194628" categoryid="12508" encounterid="[id number]"Excessive thirst no. no" label="Excessive urination" value="" options="no,yes" propid="91" itemid="196285" categoryid="12508" encounterid="[id number]"Excessive urination no. no" label="Hair loss" value="" options="no, yes" propid="91" itemid="444314" categoryid="12508" encounterid="[id number]"Hair loss no. no" label="Heat or cold intolerance" value="" options="" propid="91" itemid="447284" categoryid="12508" encounterid="[id number]"Heat or cold intolerance no.  HEMATOLOGY/LYMPH no" label="Abnormal bleeding" value="" options="no,yes" propid="91" itemid="199152" categoryid="138157" encounterid="[id number]"Abnormal bleeding no. no" label="Easy bruising" value="" options="no,yes" propid="91" itemid="170653" categoryid="138157" encounterid="[id number]"Easy bruising no. no" label="Swollen glands" value="" options="no,yes" propid="91" itemid="138158" categoryid="138157" encounterid="[id number]"Swollen glands no.  DERMATOLOGY no" label="New/changing skin lesion" value="" options="no,yes" propid="91" itemid="199126" categoryid="12503" encounterid="[id number]"New/changing skin lesion no. no" label="Rash" value="" options="no,yes" propid="91" itemid="12504" categoryid="12503" encounterid="[id number]"Rash no. no" label="Sores" value="" options="" propid="91" itemid="444313" categoryid="12503" encounterid="[id number]"Sores no.     Objective: Vitals: Wt 159, Wt change 3 lb, Ht 64, BMI 27.29, Temp 97.6, Pulse sitting 94, BP sitting 122/78  Past Results: Examination:  General Examination alert, oriented, NAD" label="CONSTITUTIONAL:" categoryPropId="10089" examid="193638"CONSTITUTIONAL: alert, oriented, NAD.  moist, warm" label="SKIN:" categoryPropId="10109"  examid="193638"SKIN: moist, warm.  Conjunctiva clear" label="EYES:" categoryPropId="21468" examid="193638"EYES: Conjunctiva clear.  clear to auscultation bilaterally" label="LUNGS:" categoryPropId="87" examid="193638"LUNGS: clear to auscultation bilaterally.  regular rate and rhythm" label="HEART:" categoryPropId="86" examid="193638"HEART: regular rate and rhythm.  no guarding" label="ABDOMEN:" categoryPropId="88" examid="193638"ABDOMEN: no guarding.  normal external genitalia, vagina - pink moist mucosa, no lesions or abnormal discharge, cervix - no discharge or lesions or CMT, adnexa - left adnexal fullness, uterus - nontender and normal size on palpation" label="FEMALE GENITOURINARY:" categoryPropId="13414" examid="193638"FEMALE GENITOURINARY: normal external genitalia, vagina - pink moist mucosa, no lesions or abnormal discharge, cervix - no discharge or lesions or CMT, adnexa - left adnexal fullness, uterus - nontender and normal size on palpation.  normal range of motion" label="EXTREMITIES:" categoryPropId="89" examid="193638"EXTREMITIES: normal range of motion.  alert and oriented x 3" label="NEUROLOGIC EXAM:" categoryPropId="10110" examid="193638"NEUROLOGIC EXAM: alert and oriented x 3.  affect normal" label="PSYCH:" categoryPropId="16316" examid="193638"PSYCH: affect normal.  Physical Examination:  Pt aware of scribe services today.   Assessment: Assessment:  Adnexal mass - N94.9 (Primary), left     Oligomenorrhea - N91.5     Abnormal uterine bleeding (AUB) - N93.9     Plan: Treatment: Adnexal mass LKG:MWNUUVOLab:Ovarian Malignancy Risk-ROMA ([id number](140045)  Imaging:US ECHO TRANSVAGINAL Notes: Plan to perform a hysteroscopy d&c and removal of left adnexal mass via laparoscopic. Discussed risk of surgery with pt. including risk of infection, bleeding, perforation of uterus, and damage to bowel with the need for further surgery. Advised no heavy lifting for 2 weeks. Plan to run insurance to check for  coverage. Pt. will notify the office if she wishes to proceed with surgery. Pt. advised no eating or drinking night prior to surgery. Pt. advised to avoid aspirin, motrin, aleve 10 days prior to surgery. Referral To: Reason:please precert for laposcopic removal of left adnexal mass and hysteroscopy D&C Oligomenorrhea IHK:VQQVZDGLOVFILab:Testosterone  EPP:IRJJ-OACZYSALab:DHEA-Sulfate ([id number](004020)  Abnormal uterine bleeding (AUB) Notes: Plan to perform hysteroscopy d&c in addition to removal of left adnexal mass. Plan to run insurance and check for coverage. Pt. will let the office know if she wishes to proceed with surgery. Procedures:  Scribe Documentation Attestation: I personally scribed for Dr. Richardson Doppole on the date of this appointment. Electronically signed by scribe Laurelyn SickleShana Nichols.  Venipuncture Gibson,Darline 12/14/2018 10:23:40 AM &gt; , performed in right arm" label="Venipuncture:" value="Gibson,Darline 12/14/2018 10:23:40 AM , performed in right arm" options="" propid="" itemid="202726" categoryid="202725" encounterid="[id number]"Venipuncture: Gibson,Darline 12/14/2018 10:23:40 AM > , performed in right arm.   Immunizations: Therapeutic Injections: Diagnostic Imaging: Lab Reports: AVW:UJWJXBJYNWGNLab:Testosterone Normal  TESTOSTERONE -  20.5  8.0-48.0 - ng/dL    Yaileen Hofferber 56/21/308606/01/2019 04:53:27 PM > Chapman,Courtney 12/19/2018 07:54:45 AM > results posted to pt's portal  VHQ:IONG-EXBMWUXLab:DHEA-Sulfate (004020) Normal  DHEA-Sulfate -  101.1  84.8-378.0 - ug/dL    Kelly Eisler 32/44/010206/01/2019 04:53:27 PM > Chapman,Courtney 12/19/2018 07:54:45 AM > results posted to pt's portal  VOZ:DGUYQIHLab:Ovarian Malignancy Risk-ROMA (140045) Normal  CA125 -  20.6  0.0-38.1 - U/mL  HE4 -  55.8  0.0-61.2 - pmol/L  Premenopausal ROMA -  0.96  See below -   Postmenopausal ROMA -  1.55  See below -   Comment: -  Comment  -     Jeniece Hannis 12/18/2018 04:53:27 PM > very low likelyhood of finding malignancy during surgery.. pt may have the surgery to remove mass with me

## 2018-12-28 NOTE — Anesthesia Preprocedure Evaluation (Addendum)
Anesthesia Evaluation  Patient identified by MRN, date of birth, ID band Patient awake    Reviewed: Allergy & Precautions, NPO status , Patient's Chart, lab work & pertinent test results  History of Anesthesia Complications Negative for: history of anesthetic complications  Airway Mallampati: II  TM Distance: >3 FB Neck ROM: Full    Dental  (+) Chipped,    Pulmonary neg pulmonary ROS,    Pulmonary exam normal        Cardiovascular Normal cardiovascular exam  Hx of thoracic outlet syndrome s/p 1st rib resection 2018   Neuro/Psych Anxiety Depression negative neurological ROS     GI/Hepatic negative GI ROS, Neg liver ROS,   Endo/Other  negative endocrine ROS  Renal/GU negative Renal ROS     Musculoskeletal negative musculoskeletal ROS (+)   Abdominal   Peds  Hematology negative hematology ROS (+)   Anesthesia Other Findings Day of surgery medications reviewed with the patient.  Reproductive/Obstetrics Left adnexal mass                            Anesthesia Physical Anesthesia Plan  ASA: I  Anesthesia Plan: General   Post-op Pain Management:    Induction: Intravenous  PONV Risk Score and Plan: 4 or greater and Scopolamine patch - Pre-op, Ondansetron, Dexamethasone, Treatment may vary due to age or medical condition and Midazolam  Airway Management Planned: Oral ETT  Additional Equipment:   Intra-op Plan:   Post-operative Plan: Extubation in OR  Informed Consent: I have reviewed the patients History and Physical, chart, labs and discussed the procedure including the risks, benefits and alternatives for the proposed anesthesia with the patient or authorized representative who has indicated his/her understanding and acceptance.     Dental advisory given  Plan Discussed with: CRNA  Anesthesia Plan Comments:        Anesthesia Quick Evaluation

## 2018-12-28 NOTE — H&P (Signed)
Date of Initial H&P: 12/28/2018 History reviewed, patient examined, , stable for surgery. Pt request endometrial ablation for the management of abnormal uterine bleeding. She is not interested in future fertility. Her husband had a vasectomy. She is aware that pregnancy is not recommended after ablation and may not be viable if she were to become pregnant. She is informed of 93% success rate for decreased menses with ablation. . She voiced understanding and desires to proceed.

## 2018-12-28 NOTE — Anesthesia Procedure Notes (Signed)
Procedure Name: Intubation Date/Time: 12/28/2018 11:37 AM Performed by: Trinna Post., CRNA Pre-anesthesia Checklist: Patient identified, Emergency Drugs available, Suction available, Patient being monitored and Timeout performed Patient Re-evaluated:Patient Re-evaluated prior to induction Oxygen Delivery Method: Circle system utilized Preoxygenation: Pre-oxygenation with 100% oxygen Induction Type: IV induction Ventilation: Mask ventilation without difficulty Laryngoscope Size: Mac and 3 Grade View: Grade I Tube type: Oral Tube size: 7.0 mm Number of attempts: 1 Airway Equipment and Method: Stylet Placement Confirmation: ETT inserted through vocal cords under direct vision,  positive ETCO2 and breath sounds checked- equal and bilateral Secured at: 23 cm Tube secured with: Tape Dental Injury: Teeth and Oropharynx as per pre-operative assessment

## 2018-12-28 NOTE — Op Note (Signed)
12/28/2018  1:26 PM  PATIENT:  Michelle Villa  33 y.o. female  PRE-OPERATIVE DIAGNOSIS:  N94.9 Adnexal mass  POST-OPERATIVE DIAGNOSIS:  adnexal mass  PROCEDURE:  Procedure(s): Operative Laparoscopy with left oophorectomy (Left) HYSTEROSCOPY/D&C (N/A) Hydrothermal ablation  SURGEON:  Surgeon(s) and Role:    Christophe Louis, MD - Primary    * Janyth Pupa, DO - Assisting  PHYSICIAN ASSISTANT: None  ASSISTANTS: Janyth Pupa DO   ANESTHESIA:   general  EBL:  15 mL   BLOOD ADMINISTERED:none  DRAINS: Urinary Catheter (Foley)   LOCAL MEDICATIONS USED:  MARCAINE     SPECIMEN:  Source of Specimen:  left ovary, pelvic washings and endometrial currettings   DISPOSITION OF SPECIMEN:  PATHOLOGY  COUNTS:  YES  TOURNIQUET:  * No tourniquets in log *  DICTATION: .Dragon Dictation  PLAN OF CARE: Discharge to home after PACU  PATIENT DISPOSITION:  PACU - hemodynamically stable.   Delay start of Pharmacological VTE agent (>24 hrs) due to surgical blood loss or risk of bleeding: not applicable  Findings: Normal external genitalia. Normal appearing uterus. Normal fallopian tube bilaterally. Enlarged and nodular left ovary. Normal appearing right ovary.  Endometrium appears proliferative and thickened. No endometrial masses appreciated.   Procedure:The patient was taken to the operating room placed under general anesthesia. Time Out was performed.  She was  Prepped and draped in the normal sterile fashion. A foley catheter was placed.  A uterine manipulator was placed. .  Attention was turned to the abdomen where the umbilicus was injected with 10 cc of marcaine. A 10 mm trocar was placed under direct visualization. Pneumoperitoneum was achieved with C02 gas... A 5 mm trocar was placed in the right and left lower quadrants. Each trocar site was injected with 10 cc of marcaine prior to trocar placement. Pelvic washings were collected. . The harmonic scalpel was used transect the Left  infundibulopelvic ligament. An endo catch bag was placed through the 10 mm umbilical port. The specimen was placed in the bag and removed through the umbilical incision. Pneumoperitoneum was reestablished.The pelvis was irrigated. Excellent hemostasis was noted. All trocars were removed under direct visualization . The pneumoperitoneum was released.  The fascia of the umbilical incision was re approximated with 0 vicryl. The skin incisions were closed with 4-0 vicryl and derma bond.     Attention was turned to the vagina were the uterine manipulator was removed. . A speculum was placed in the vaginal vault. The anterior lip of the cervix was grasped with a single tooth tenaculum. The uterus was sounded to 8 cm. The cervix was dilated to 8 mm. The hydrothermal hysteroscope was inserted. The ostia were visualized bilaterally. The hysteroscope was removed. A sharp curette was introduced and endometrial curetting's were obtained. The hysteroscope was reinserted.  There was no evidence of perforation. The hydrothermal ablation was performed for 10 minutes.  The hysteroscope was removed. 10 cc of 25% marcaine was injected at the 4 and 8 o'clock position.  The single tooth tenaculum was removed.  Excellent hemostasis was noted.   Sponge lap and needle counts were correct x 2.  The patient was awakened from anesthesia and taken to the recovery room in stable condition.

## 2018-12-28 NOTE — Anesthesia Postprocedure Evaluation (Signed)
Anesthesia Post Note  Patient: Michelle Villa  Procedure(s) Performed: Operative Laparoscopy with left oophorectomy (Left Abdomen) HYSTEROSCOPY/D&C (N/A Vagina )     Patient location during evaluation: PACU Anesthesia Type: General Level of consciousness: awake and alert Pain management: pain level controlled Vital Signs Assessment: post-procedure vital signs reviewed and stable Respiratory status: spontaneous breathing, nonlabored ventilation and respiratory function stable Cardiovascular status: blood pressure returned to baseline and stable Postop Assessment: no apparent nausea or vomiting Anesthetic complications: no    Last Vitals:  Vitals:   12/28/18 1400 12/28/18 1404  BP: (!) 107/91 114/69  Pulse: 85 81  Resp: 18 20  Temp:  (!) 36.3 C  SpO2: 97% 92%    Last Pain:  Vitals:   12/28/18 1351  TempSrc:   PainSc: [place]

## 2018-12-29 ENCOUNTER — Encounter (HOSPITAL_COMMUNITY): Payer: Self-pay | Admitting: Obstetrics and Gynecology

## 2019-02-15 DIAGNOSIS — R102 Pelvic and perineal pain: Secondary | ICD-10-CM | POA: Diagnosis not present

## 2019-02-20 ENCOUNTER — Other Ambulatory Visit: Payer: Self-pay

## 2019-02-20 DIAGNOSIS — Z20822 Contact with and (suspected) exposure to covid-19: Secondary | ICD-10-CM

## 2019-02-21 LAB — NOVEL CORONAVIRUS, NAA: SARS-CoV-2, NAA: NOT DETECTED

## 2019-02-25 ENCOUNTER — Encounter (HOSPITAL_COMMUNITY): Payer: Self-pay | Admitting: *Deleted

## 2019-02-25 ENCOUNTER — Emergency Department (HOSPITAL_COMMUNITY)
Admission: EM | Admit: 2019-02-25 | Discharge: 2019-02-25 | Disposition: A | Discharge status: Home / Self Care | Payer: BC Managed Care – PPO | Attending: Emergency Medicine | Admitting: Emergency Medicine

## 2019-02-25 ENCOUNTER — Other Ambulatory Visit: Payer: Self-pay

## 2019-02-25 DIAGNOSIS — Z79899 Other long term (current) drug therapy: Secondary | ICD-10-CM | POA: Diagnosis not present

## 2019-02-25 DIAGNOSIS — Y929 Unspecified place or not applicable: Secondary | ICD-10-CM | POA: Insufficient documentation

## 2019-02-25 DIAGNOSIS — Y999 Unspecified external cause status: Secondary | ICD-10-CM | POA: Diagnosis not present

## 2019-02-25 DIAGNOSIS — Y939 Activity, unspecified: Secondary | ICD-10-CM | POA: Diagnosis not present

## 2019-02-25 DIAGNOSIS — Z7982 Long term (current) use of aspirin: Secondary | ICD-10-CM | POA: Diagnosis not present

## 2019-02-25 DIAGNOSIS — S61211A Laceration without foreign body of left index finger without damage to nail, initial encounter: Secondary | ICD-10-CM

## 2019-02-25 DIAGNOSIS — W260XXA Contact with knife, initial encounter: Secondary | ICD-10-CM | POA: Insufficient documentation

## 2019-02-25 DIAGNOSIS — S6992XA Unspecified injury of left wrist, hand and finger(s), initial encounter: Secondary | ICD-10-CM | POA: Diagnosis not present

## 2019-02-25 MED ORDER — CEPHALEXIN 500 MG PO CAPS: 500.0000 mg | ORAL_CAPSULE | Freq: Two times a day (BID) | ORAL | 0 refills | Status: AC

## 2019-02-25 MED ORDER — LIDOCAINE HCL (PF) 1 % IJ SOLN
INTRAMUSCULAR | Status: AC
  Filled 2019-02-25: qty 5

## 2019-02-25 MED ORDER — LIDOCAINE HCL (PF) 1 % IJ SOLN
5.0000 mL | Freq: Once | INTRAMUSCULAR | Status: AC
  Administered 2019-02-25: 5 mL
  Filled 2019-02-25: qty 5

## 2019-02-25 NOTE — ED Provider Notes (Signed)
[facility] Provider Note   CSN: [id number] Arrival date & time: 02/25/19  0155  History   Chief Complaint Chief Complaint  Patient presents with  . Laceration   HPI Lucila MaineLynette Rider-Vega is a 33 y.o. female past medical history significant for rainouts, depression, anxiety who presents for evaluation of laceration.  Patient sustained a laceration to her second digit on her left upper extremity at approximately 2 AM, 6 hours PTA.  Patient was able to rinse out the extremity.  Patient cut her extremity on a knife while cutting bread.  She denies crush injury.  She is up-to-date on her tetanus.  Denies fever, chills, decreased range of motion, numbness or tingling, redness, swelling, warmth to extremities.  Has not take anything for pain.  She rates her pain a 5/10.  Denies additional aggravating or alleviating factors.   History of pain from patient past medical records.  No interpreter was used.       HPI  Past Medical History:  Diagnosis Date  . Anxiety   . Depression   . Raynaud phenomenon   . Thyroid cyst    3 tumors on thyroid- all biopsied & benign     Patient Active Problem List   Diagnosis Date Noted  . Vascular thoracic outlet syndrome 09/28/2016  . Limb ischemia 09/17/2016  . Raynaud's phenomenon 07/20/2016    Past Surgical History:  Procedure Laterality Date  . AORTIC ARCH ANGIOGRAPHY N/A 09/11/2016   Procedure: Aortic Arch Angiography;  Surgeon: Sherren Kernsharles E Fields, MD;  [facility];  Service: Cardiovascular;  Laterality: N/A;  . EMBOLECTOMY Left 09/28/2016   Procedure: Left Brachial, radial, ulnar and intraosseous artery thrombectomy;  Surgeon: Sherren Kernsharles E Fields, MD;  [facility];  Service: Vascular;  Laterality: Left;  . HYSTEROSCOPY N/A 12/28/2018   Procedure: HYSTEROSCOPY/D&C;  Surgeon: Gerald Leitzole, Tara, MD;  [facility];  Service: Gynecology;  Laterality: N/A;  . I&D EXTREMITY Left 09/28/2016   Procedure:  EVACUATION HEMATOMA NECK;  Surgeon: Sherren Kernsharles E Fields, MD;  [facility];  Service: Vascular;  Laterality: Left;  . LAPAROSCOPY Left 12/28/2018   Procedure: Operative Laparoscopy with left oophorectomy;  Surgeon: Gerald Leitzole, Tara, MD;  [facility];  Service: Gynecology;  Laterality: Left;  . PATCH ANGIOPLASTY Left 09/28/2016   Procedure: PATCH ANGIOPLASTY BRACHIAL ARTERY USING VIEN HARVESTED FROM PATIENT;  Surgeon: Sherren Kernsharles E Fields, MD;  [facility];  Service: Vascular;  Laterality: Left;  . PERIPHERAL VASCULAR INTERVENTION Left 09/28/2016   Procedure: LEFT SUBCLAVIAN STENT;  Surgeon: Sherren Kernsharles E Fields, MD;  [facility];  Service: Vascular;  Laterality: Left;  . RIB RESECTION Left 09/28/2016   Procedure: FIRST RIB RESECTION;  Surgeon: Sherren Kernsharles E Fields, MD;  [facility];  Service: Vascular;  Laterality: Left;  . UPPER EXTREMITY ANGIOGRAPHY N/A 09/11/2016   Procedure: Upper Extremity Angiography - Left;  Surgeon: Sherren Kernsharles E Fields, MD;  [facility];  Service: Cardiovascular;  Laterality: N/A;  . WISDOM TOOTH EXTRACTION       OB History   No obstetric history on file.      Home Medications    Prior to Admission medications   Medication Sig Start Date End Date Taking? Authorizing Provider  acetaminophen (TYLENOL) 325 MG tablet Take 650 mg by mouth every 6 (six) hours as needed for moderate pain or headache.     [provider]  ALPRAZolam Prudy Feeler(XANAX) 0.5 MG tablet Take 0.5-1 mg by mouth daily as  needed for anxiety.    [provider]  aspirin EC 81 MG tablet Take 81 mg by mouth daily.    [provider]  cephALEXin (KEFLEX) 500 MG capsule Take 1 capsule (500 mg total) by mouth 2 (two) times daily for 5 days. 02/25/19 03/02/19  Henderly, Britni A, PA-C  ECHINACEA PO Take 1 capsule by mouth daily.     [provider]  escitalopram (LEXAPRO) 10 MG tablet Take 15 mg by mouth daily before breakfast.     [provider]  ibuprofen  (ADVIL) 800 MG tablet Take 1 tablet (800 mg total) by mouth every 8 (eight) hours as needed. 12/28/18   Gerald Leitzole, Tara, MD  Multiple Vitamin (MULTIVITAMIN) capsule Take 1 capsule by mouth daily.     [provider]  Multiple Vitamins-Minerals (MULTI COMPLETE PO) Take 1 tablet by mouth daily.     [provider]  oxyCODONE (OXY IR/ROXICODONE) 5 MG immediate release tablet 1-2 every 6 hours as needed for severe pain 12/28/18   Gerald Leitzole, Tara, MD  vitamin B-12 (CYANOCOBALAMIN) 250 MCG tablet Take 250 mcg by mouth daily.    [provider]    Family History Family History  Problem Relation Age of Onset  . Hypertension Father   . Hyperlipidemia Father   . Neuropathy Neg Hx     Social History Social History   Tobacco Use  . Smoking status: Never Smoker  . Smokeless tobacco: Never Used  Substance Use Topics  . Alcohol use: Yes    Alcohol/week: 7.0 standard drinks    Types: 7 Standard drinks or equivalent per week    Comment: 7 per week average  . Drug use: No     Allergies   Other   Review of Systems Review of Systems  Constitutional: Negative.   HENT: Negative.   Respiratory: Negative.   Cardiovascular: Negative.   Gastrointestinal: Negative.   Genitourinary: Negative.   Skin: Positive for wound.  Neurological: Negative.   All other systems reviewed and are negative.    Physical Exam Updated Vital Signs BP 112/80 (BP Location: Right Arm)   Pulse 93   Temp 98.1 F (36.7 C) (Oral)   Resp 16   SpO2 97%   Physical Exam Vitals signs and nursing note reviewed.  Constitutional:      General: She is not in acute distress.    Appearance: She is well-developed. She is not ill-appearing, toxic-appearing or diaphoretic.  HENT:     Head: Atraumatic.  Eyes:     Pupils: Pupils are equal, round, and reactive to light.  Neck:     Musculoskeletal: Normal range of motion.  Cardiovascular:     Rate and Rhythm: Normal rate.  Pulmonary:     Effort: No  respiratory distress.  Abdominal:     General: There is no distension.  Musculoskeletal:     Right wrist: Normal.     Left wrist: Normal.     Right hand: Normal.     Left hand: She exhibits decreased range of motion, tenderness and laceration. She exhibits no bony tenderness, normal two-point discrimination, normal capillary refill, no deformity and no swelling. Normal sensation noted. Normal strength noted.     Comments: 1 cm laceration to distal aspect of second digit on left upper extremity.  No nailbed involvement.  Full range of motion without difficulty.  No swelling, tenderness or obvious deformity.  Skin:    General: Skin is warm and dry.     Comments: Superficial,  nonbleeding, nondraining laceration approximately 1 cm to distal aspect of second digit on left upper extremity.    Neurological:     Mental Status: She is alert.    ED Treatments / Results  Labs (all labs ordered are listed, but only abnormal results are displayed) Labs Reviewed - No data to display  EKG None  Radiology No results found.  Procedures .Marland KitchenLaceration Repair  Date/Time: 02/25/2019 9:13 AM Performed by: Nettie Elm, PA-C Authorized by: Nettie Elm, PA-C   Consent:    Consent obtained:  Verbal   Consent given by:  Patient   Risks discussed:  Infection, need for additional repair, pain, poor cosmetic result and poor wound healing   Alternatives discussed:  No treatment and delayed treatment Universal protocol:    Procedure explained and questions answered to patient or proxy's satisfaction: yes     Relevant documents present and verified: yes     Test results available and properly labeled: yes     Imaging studies available: yes     Required blood products, implants, devices, and special equipment available: yes     Site/side marked: yes     Immediately prior to procedure, a time out was called: yes     Patient identity confirmed:  Verbally with patient Anesthesia (see MAR for  exact dosages):    Anesthesia method:  Local infiltration   Local anesthetic:  Lidocaine 1% w/o epi Laceration details:    Location:  Finger   Finger location:  L index finger   Length (cm):  1.2   Depth (mm):  3 Repair type:    Repair type:  Simple Pre-procedure details:    Preparation:  Patient was prepped and draped in usual sterile fashion and imaging obtained to evaluate for foreign bodies Exploration:    Hemostasis achieved with:  Direct pressure   Wound exploration: wound explored through full range of motion and entire depth of wound probed and visualized     Wound extent: no foreign bodies/material noted, no muscle damage noted, no nerve damage noted, no tendon damage noted and no vascular damage noted     Contaminated: no   Treatment:    Area cleansed with:  Betadine   Amount of cleaning:  Extensive   Irrigation solution:  Sterile saline   Irrigation method:  Pressure wash Skin repair:    Repair method:  Sutures   Suture size:  5-0   Suture material:  Prolene   Suture technique:  Simple interrupted   Number of sutures:  5 Approximation:    Approximation:  Close Post-procedure details:    Dressing:  Open (no dressing)   Patient tolerance of procedure:  Tolerated well, no immediate complications .Nerve Block  Date/Time: 02/25/2019 9:14 AM Performed by: Nettie Elm, PA-C Authorized by: Nettie Elm, PA-C   Consent:    Consent obtained:  Verbal   Consent given by:  Patient   Risks discussed:  Allergic reaction, infection, nerve damage, swelling, pain, unsuccessful block, intravenous injection and bleeding   Alternatives discussed:  No treatment Indications:    Indications:  Pain relief Location:    Body area:  Upper extremity   Laterality:  Left Pre-procedure details:    Skin preparation:  Povidone-iodine   Preparation: Patient was prepped and draped in usual sterile fashion   Skin anesthesia (see MAR for exact dosages):    Skin anesthesia  method:  None Procedure details (see MAR for exact dosages):    Block needle gauge:  25 G   Anesthetic injected:  Lidocaine 1% w/o epi   Steroid injected:  None   Additive injected:  None   Injection procedure:  Anatomic landmarks identified Post-procedure details:    Dressing:  None   Outcome:  Pain relieved   Patient tolerance of procedure:  Tolerated well, no immediate complications   (including critical care time)  Medications Ordered in ED Medications  lidocaine (PF) (XYLOCAINE) 1 % injection (has no administration in time range)  lidocaine (PF) (XYLOCAINE) 1 % injection 5 mL (5 mLs Infiltration Given 02/25/19 0911)    Initial Impression / Assessment and Plan / ED Course  I have reviewed the triage vital signs and the nursing notes.  Pertinent labs & imaging results that were available during my care of the patient were reviewed by me and considered in my medical decision making (see chart for details).  33 year old female appears otherwise well presents for evaluation of laceration which occurred 6 hours PTA.  Her tetanus is up-to-date.  Patient with 1.2 cm superficial laceration to distal aspect of her second digit on her left hand which occurred from slicing bread.  No bony tenderness.  Do not feel patient needs imaging given superficial laceration from cutting with a knife.  Low suspicion for underlying fracture.  No evidence of vascular, tendon or ligament damage.  Full range of motion without difficulty.  Neurovascularly intact.  No nailbed involvement.  Wound does not look infected.  #5 Prolene sutures placed.  I also did a digital block for pain relief.  Discussed follow-up for sutures to be removed. Hemodynamically stable.  Discussed strict return precautions which would require reevaluation.  The patient has been appropriately medically screened and/or stabilized in the ED. I have low suspicion for any other emergent medical condition which would require further screening,  evaluation or treatment in the ED or require inpatient management.        Final Clinical Impressions(s) / ED Diagnoses   Final diagnoses:  Laceration of left index finger without foreign body without damage to nail, initial encounter    ED Discharge Orders         Ordered    cephALEXin (KEFLEX) 500 MG capsule  2 times daily     02/25/19 0913           Henderly, Britni A, PA-C 02/25/19 [id number]    Alvira MondaySchlossman, Erin, MD 02/28/19 1605

## 2019-02-25 NOTE — ED Triage Notes (Signed)
Pt was slicing bread and cut her L index finger; bleeding controlled at present

## 2019-02-25 NOTE — Discharge Instructions (Signed)
Take the antibiotics as prescribed.  Follow-up with PCP, urgent care for removal of your sutures in approximately 10 days.  You may let warm soapy water run over this area.

## 2019-02-26 ENCOUNTER — Telehealth (HOSPITAL_COMMUNITY): Payer: Self-pay | Admitting: Physician Assistant

## 2019-02-26 MED ORDER — HYDROCODONE-ACETAMINOPHEN 5-325 MG PO TABS: 1.0000 | ORAL_TABLET | Freq: Four times a day (QID) | ORAL | 0 refills | Status: DC | PRN

## 2019-02-26 NOTE — Telephone Encounter (Addendum)
I personally saw and evaluated the patient yesterday in the ED. She called back to the charge nurse today for persistent pain to the finger where the sutures were placed.Patient called to state that her finger was in 10/10 pain after having sutures placed yesterday.  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  Has been taking ibuprofen without relief of her pain.  She has not tried Tylenol.  She denies increased redness, swelling, warmth, numbness or tingling, discoloration to extremity.  Denies systemic symptoms.  I have called patient in a small amount of Norco for her pain.  Discussed trying Tylenol first before the narcotics only using for breakthrough pain.  Discussed not driving, operate heavy machinery for this.  She is to follow-up in 2 days if she has any new or worsening symptoms.

## 2019-02-27 ENCOUNTER — Other Ambulatory Visit: Payer: Self-pay

## 2019-02-27 DIAGNOSIS — Z20822 Contact with and (suspected) exposure to covid-19: Secondary | ICD-10-CM

## 2019-03-01 LAB — NOVEL CORONAVIRUS, NAA: SARS-CoV-2, NAA: NOT DETECTED

## 2019-03-06 DIAGNOSIS — F330 Major depressive disorder, recurrent, mild: Secondary | ICD-10-CM | POA: Diagnosis not present

## 2019-03-06 DIAGNOSIS — S61211A Laceration without foreign body of left index finger without damage to nail, initial encounter: Secondary | ICD-10-CM | POA: Diagnosis not present

## 2019-03-13 DIAGNOSIS — F330 Major depressive disorder, recurrent, mild: Secondary | ICD-10-CM | POA: Diagnosis not present

## 2019-04-10 DIAGNOSIS — F330 Major depressive disorder, recurrent, mild: Secondary | ICD-10-CM | POA: Diagnosis not present

## 2019-04-13 DIAGNOSIS — N921 Excessive and frequent menstruation with irregular cycle: Secondary | ICD-10-CM | POA: Diagnosis not present

## 2019-04-13 DIAGNOSIS — N83209 Unspecified ovarian cyst, unspecified side: Secondary | ICD-10-CM | POA: Diagnosis not present

## 2019-05-01 DIAGNOSIS — F330 Major depressive disorder, recurrent, mild: Secondary | ICD-10-CM | POA: Diagnosis not present

## 2019-05-04 ENCOUNTER — Other Ambulatory Visit: Payer: Self-pay

## 2019-05-04 DIAGNOSIS — Z20822 Contact with and (suspected) exposure to covid-19: Secondary | ICD-10-CM

## 2019-05-06 LAB — NOVEL CORONAVIRUS, NAA: SARS-CoV-2, NAA: NOT DETECTED

## 2019-05-08 ENCOUNTER — Other Ambulatory Visit: Payer: Self-pay

## 2019-05-08 DIAGNOSIS — Z20822 Contact with and (suspected) exposure to covid-19: Secondary | ICD-10-CM

## 2019-05-08 DIAGNOSIS — R05 Cough: Secondary | ICD-10-CM | POA: Diagnosis not present

## 2019-05-08 DIAGNOSIS — R509 Fever, unspecified: Secondary | ICD-10-CM | POA: Diagnosis not present

## 2019-05-09 LAB — NOVEL CORONAVIRUS, NAA: SARS-CoV-2, NAA: DETECTED — AB

## 2019-05-15 ENCOUNTER — Other Ambulatory Visit: Payer: Self-pay

## 2019-05-15 DIAGNOSIS — Z20822 Contact with and (suspected) exposure to covid-19: Secondary | ICD-10-CM

## 2019-05-16 LAB — NOVEL CORONAVIRUS, NAA: SARS-CoV-2, NAA: DETECTED — AB

## 2019-05-17 DIAGNOSIS — U071 COVID-19: Secondary | ICD-10-CM | POA: Diagnosis not present

## 2019-05-29 DIAGNOSIS — F330 Major depressive disorder, recurrent, mild: Secondary | ICD-10-CM | POA: Diagnosis not present

## 2019-06-05 DIAGNOSIS — F330 Major depressive disorder, recurrent, mild: Secondary | ICD-10-CM | POA: Diagnosis not present

## 2019-06-19 DIAGNOSIS — G540 Brachial plexus disorders: Secondary | ICD-10-CM | POA: Diagnosis not present

## 2019-07-06 ENCOUNTER — Other Ambulatory Visit: Payer: Self-pay

## 2019-07-06 DIAGNOSIS — I7300 Raynaud's syndrome without gangrene: Secondary | ICD-10-CM

## 2019-07-13 ENCOUNTER — Ambulatory Visit (INDEPENDENT_AMBULATORY_CARE_PROVIDER_SITE_OTHER): Payer: BC Managed Care – PPO | Admitting: Vascular Surgery

## 2019-07-13 ENCOUNTER — Other Ambulatory Visit: Payer: Self-pay

## 2019-07-13 ENCOUNTER — Ambulatory Visit (HOSPITAL_COMMUNITY)
Admission: RE | Admit: 2019-07-13 | Discharge: 2019-07-13 | Disposition: A | Discharge status: Home / Self Care | Payer: BC Managed Care – PPO | Source: Ambulatory Visit | Attending: Family | Admitting: Family

## 2019-07-13 ENCOUNTER — Encounter: Payer: Self-pay | Admitting: Vascular Surgery

## 2019-07-13 VITALS — BP 129/86 | HR 100 | Temp 98.2°F | Resp 20 | Ht 63.0 in | Wt 166.6 lb

## 2019-07-13 DIAGNOSIS — I7300 Raynaud's syndrome without gangrene: Secondary | ICD-10-CM | POA: Diagnosis not present

## 2019-07-13 DIAGNOSIS — G540 Brachial plexus disorders: Secondary | ICD-10-CM

## 2019-07-13 NOTE — Progress Notes (Signed)
Patient is a 33 year old female who returns for follow-up today.  She underwent left first rib resection and stenting of her left subclavian artery March 2018.  This was done for vascular thoracic outlet syndrome.  She had had preoperative arterial embolization to her left hand.  She still has cold intolerance in her left hand.  She has been having some intermittent numbness and tingling in her left hand recently.  She feels like the left arm may be slightly weaker as well.  She is still working full-time.  She did COVID in the fall but recovered from this.  She is currently on aspirin 81 mg daily.  Current Outpatient Medications on File Prior to Visit  Medication Sig Dispense Refill  . acetaminophen (TYLENOL) 325 MG tablet Take 650 mg by mouth every 6 (six) hours as needed for moderate pain or headache.     . ALPRAZolam (XANAX) 1 MG tablet 1 mg 2 (two) times daily as needed.     Marland Kitchen aspirin EC 81 MG tablet Take 81 mg by mouth daily.    . cyclobenzaprine (FLEXERIL) 10 MG tablet Take 10 mg by mouth at bedtime.    Marland Kitchen ECHINACEA PO Take 1 capsule by mouth daily.     Marland Kitchen escitalopram (LEXAPRO) 10 MG tablet Take 15 mg by mouth daily before breakfast.     . ibuprofen (ADVIL) 800 MG tablet Take 1 tablet (800 mg total) by mouth every 8 (eight) hours as needed. 30 tablet 1  . Multiple Vitamin (MULTIVITAMIN) capsule Take 1 capsule by mouth daily.     . Multiple Vitamins-Minerals (MULTI COMPLETE PO) Take 1 tablet by mouth daily.     . traMADol (ULTRAM) 50 MG tablet Take 50 mg by mouth every 6 (six) hours as needed.    . vitamin B-12 (CYANOCOBALAMIN) 250 MCG tablet Take 250 mcg by mouth daily.     No current facility-administered medications on file prior to visit.    Past Medical History:  Diagnosis Date  . Anxiety   . Depression   . Raynaud phenomenon   . Thyroid cyst    3 tumors on thyroid- all biopsied & benign    Social History   Socioeconomic History  . Marital status: Married    Spouse name: Not  on file  . Number of children: Not on file  . Years of education: Not on file  . Highest education level: Not on file  Occupational History  . Occupation: waitress  Tobacco Use  . Smoking status: Never Smoker  . Smokeless tobacco: Never Used  Substance and Sexual Activity  . Alcohol use: Yes    Alcohol/week: 7.0 standard drinks    Types: 7 Standard drinks or equivalent per week    Comment: 7 per week average  . Drug use: No  . Sexual activity: Not on file  Other Topics Concern  . Not on file  Social History Narrative  . Not on file   Social Determinants of Health   Financial Resource Strain:   . Difficulty of Paying Living Expenses: Not on file  Food Insecurity:   . Worried About Charity fundraiser in the Last Year: Not on file  . Ran Out of Food in the Last Year: Not on file  Transportation Needs:   . Lack of Transportation (Medical): Not on file  . Lack of Transportation (Non-Medical): Not on file  Physical Activity:   . Days of Exercise per Week: Not on file  . Minutes of Exercise per  Session: Not on file  Stress:   . Feeling of Stress : Not on file  Social Connections:   . Frequency of Communication with Friends and Family: Not on file  . Frequency of Social Gatherings with Friends and Family: Not on file  . Attends Religious Services: Not on file  . Active Member of Clubs or Organizations: Not on file  . Attends Banker Meetings: Not on file  . Marital Status: Not on file  Intimate Partner Violence:   . Fear of Current or Ex-Partner: Not on file  . Emotionally Abused: Not on file  . Physically Abused: Not on file  . Sexually Abused: Not on file    Physical exam:  Vitals:   07/13/19 1104  BP: 129/86  Pulse: 100  Resp: 20  Temp: 98.2 F (36.8 C)  SpO2: 97%  Weight: 166 lb 9.6 oz (75.6 kg)  Height: 5\' 3"  (1.6 m)    Extremities: Left upper extremity no palpable radial or ulnar pulse.  She does have a 2+ high brachial pulse  Skin: Left  hand is pink warm well perfused on exam  Data: Patient had a duplex ultrasound today which showed the left subclavian stent was widely patent with triphasic waveforms and there were triphasic flow in the brachial radial and ulnar arteries in the left arm  Assessment: Doing well status post left first rib resection and subclavian artery stenting.  No evidence of recurrent stenosis.  Patient has had some symptoms of numbness and tingling in her left arm recently.  However, duplex ultrasound did not really show anything worrisome today.  Plan: Patient will call me in the near future if she has continued worsening of symptoms of her left upper extremity.  If she does we would consider left upper extremity arteriogram.  Otherwise she will follow-up in 1 year with a repeat duplex exam.  , MD [facility] [place] Office: [phone]

## 2019-07-21 ENCOUNTER — Other Ambulatory Visit (HOSPITAL_COMMUNITY): Payer: Self-pay | Admitting: Vascular Surgery

## 2019-07-21 DIAGNOSIS — I7300 Raynaud's syndrome without gangrene: Secondary | ICD-10-CM

## 2019-07-26 ENCOUNTER — Encounter: Payer: Self-pay | Admitting: Cardiovascular Disease

## 2019-07-26 ENCOUNTER — Other Ambulatory Visit (HOSPITAL_COMMUNITY)
Admission: RE | Admit: 2019-07-26 | Discharge: 2019-07-26 | Disposition: A | Discharge status: Home / Self Care | Payer: BC Managed Care – PPO | Source: Ambulatory Visit | Attending: Family Medicine | Admitting: Family Medicine

## 2019-07-26 ENCOUNTER — Other Ambulatory Visit: Payer: Self-pay | Admitting: Family Medicine

## 2019-07-26 DIAGNOSIS — Z0000 Encounter for general adult medical examination without abnormal findings: Secondary | ICD-10-CM | POA: Diagnosis not present

## 2019-07-26 DIAGNOSIS — Z13220 Encounter for screening for lipoid disorders: Secondary | ICD-10-CM | POA: Diagnosis not present

## 2019-07-26 DIAGNOSIS — Z124 Encounter for screening for malignant neoplasm of cervix: Secondary | ICD-10-CM | POA: Diagnosis not present

## 2019-07-26 DIAGNOSIS — E538 Deficiency of other specified B group vitamins: Secondary | ICD-10-CM | POA: Diagnosis not present

## 2019-07-26 DIAGNOSIS — E611 Iron deficiency: Secondary | ICD-10-CM | POA: Diagnosis not present

## 2019-07-26 DIAGNOSIS — Z5181 Encounter for therapeutic drug level monitoring: Secondary | ICD-10-CM | POA: Diagnosis not present

## 2019-07-28 LAB — CYTOLOGY - PAP
Comment: NEGATIVE
Diagnosis: NEGATIVE
High risk HPV: NEGATIVE

## 2019-07-31 DIAGNOSIS — F330 Major depressive disorder, recurrent, mild: Secondary | ICD-10-CM | POA: Diagnosis not present

## 2019-08-02 ENCOUNTER — Other Ambulatory Visit: Payer: Self-pay | Admitting: *Deleted

## 2019-08-02 NOTE — Progress Notes (Signed)
Call to patient instructed to go to [facility] for nasal swab for Covid 08/19/2019 at 9:30 then to quarantine. Report to admitting office at [facility] at 6:30 am  On 08/23/2019 for procedure with Dr. Darrick Penna. NPO past MN night prior Hold morning medications until after this procedure. Must have a driver and caregiver for discharge. Verbalized understanding. To call this office if questions.

## 2019-08-14 DIAGNOSIS — F330 Major depressive disorder, recurrent, mild: Secondary | ICD-10-CM | POA: Diagnosis not present

## 2019-08-19 ENCOUNTER — Other Ambulatory Visit (HOSPITAL_COMMUNITY)
Admission: RE | Admit: 2019-08-19 | Discharge: 2019-08-19 | Disposition: A | Discharge status: Home / Self Care | Payer: BC Managed Care – PPO | Source: Ambulatory Visit | Attending: Vascular Surgery | Admitting: Vascular Surgery

## 2019-08-19 DIAGNOSIS — Z01812 Encounter for preprocedural laboratory examination: Secondary | ICD-10-CM | POA: Diagnosis not present

## 2019-08-19 DIAGNOSIS — Z20822 Contact with and (suspected) exposure to covid-19: Secondary | ICD-10-CM | POA: Diagnosis not present

## 2019-08-19 LAB — SARS CORONAVIRUS 2 (TAT 6-24 HRS): SARS Coronavirus 2: NEGATIVE

## 2019-08-23 ENCOUNTER — Ambulatory Visit (HOSPITAL_COMMUNITY)
Admission: RE | Admit: 2019-08-23 | Discharge: 2019-08-23 | Disposition: A | Discharge status: Home / Self Care | Payer: BC Managed Care – PPO | Attending: Vascular Surgery | Admitting: Vascular Surgery

## 2019-08-23 ENCOUNTER — Encounter (HOSPITAL_COMMUNITY)
Admission: RE | Disposition: A | Discharge status: Home / Self Care | Payer: Self-pay | Source: Home / Self Care | Attending: Vascular Surgery

## 2019-08-23 ENCOUNTER — Other Ambulatory Visit: Payer: Self-pay

## 2019-08-23 DIAGNOSIS — I70208 Unspecified atherosclerosis of native arteries of extremities, other extremity: Secondary | ICD-10-CM | POA: Insufficient documentation

## 2019-08-23 DIAGNOSIS — Z79899 Other long term (current) drug therapy: Secondary | ICD-10-CM | POA: Insufficient documentation

## 2019-08-23 DIAGNOSIS — G540 Brachial plexus disorders: Secondary | ICD-10-CM | POA: Diagnosis not present

## 2019-08-23 DIAGNOSIS — Z7982 Long term (current) use of aspirin: Secondary | ICD-10-CM | POA: Diagnosis not present

## 2019-08-23 DIAGNOSIS — Z8616 Personal history of COVID-19: Secondary | ICD-10-CM | POA: Insufficient documentation

## 2019-08-23 HISTORY — PX: UPPER EXTREMITY ANGIOGRAPHY: CATH118270

## 2019-08-23 LAB — POCT I-STAT, CHEM 8
BUN: 11 mg/dL (ref 6–20)
Calcium, Ion: 1.13 mmol/L — ABNORMAL LOW (ref 1.15–1.40)
Chloride: 106 mmol/L (ref 98–111)
Creatinine, Ser: 0.6 mg/dL (ref 0.44–1.00)
Glucose, Bld: 94 mg/dL (ref 70–99)
HCT: 39 % (ref 36.0–46.0)
Hemoglobin: 13.3 g/dL (ref 12.0–15.0)
Potassium: 4.2 mmol/L (ref 3.5–5.1)
Sodium: 136 mmol/L (ref 135–145)
TCO2: 24 mmol/L (ref 22–32)

## 2019-08-23 LAB — PREGNANCY, URINE: Preg Test, Ur: NEGATIVE

## 2019-08-23 SURGERY — UPPER EXTREMITY ANGIOGRAPHY
Anesthesia: LOCAL | Laterality: Left

## 2019-08-23 MED ORDER — HEPARIN SODIUM (PORCINE) 1000 UNIT/ML IJ SOLN
INTRAMUSCULAR | Status: AC
  Filled 2019-08-23: qty 1

## 2019-08-23 MED ORDER — SODIUM CHLORIDE 0.9 % IV SOLN: INTRAVENOUS | Status: DC

## 2019-08-23 MED ORDER — FENTANYL CITRATE (PF) 100 MCG/2ML IJ SOLN
INTRAMUSCULAR | Status: AC
  Filled 2019-08-23: qty 2

## 2019-08-23 MED ORDER — MIDAZOLAM HCL 2 MG/2ML IJ SOLN
INTRAMUSCULAR | Status: AC
  Filled 2019-08-23: qty 2

## 2019-08-23 MED ORDER — ONDANSETRON HCL 4 MG/2ML IJ SOLN: 4.0000 mg | Freq: Four times a day (QID) | INTRAMUSCULAR | Status: DC | PRN

## 2019-08-23 MED ORDER — SODIUM CHLORIDE 0.9 % IV SOLN: 250.0000 mL | INTRAVENOUS | Status: DC | PRN

## 2019-08-23 MED ORDER — LABETALOL HCL 5 MG/ML IV SOLN: 10.0000 mg | INTRAVENOUS | Status: DC | PRN

## 2019-08-23 MED ORDER — HEPARIN SODIUM (PORCINE) 1000 UNIT/ML IJ SOLN
INTRAMUSCULAR | Status: DC | PRN
  Administered 2019-08-23: 5000 [IU] via INTRAVENOUS

## 2019-08-23 MED ORDER — SODIUM CHLORIDE 0.9% FLUSH: 3.0000 mL | Freq: Two times a day (BID) | INTRAVENOUS | Status: DC

## 2019-08-23 MED ORDER — SODIUM CHLORIDE 0.9% FLUSH: 3.0000 mL | INTRAVENOUS | Status: DC | PRN

## 2019-08-23 MED ORDER — HEPARIN (PORCINE) IN NACL 1000-0.9 UT/500ML-% IV SOLN
INTRAVENOUS | Status: DC | PRN
  Administered 2019-08-23: 500 mL

## 2019-08-23 MED ORDER — MIDAZOLAM HCL 2 MG/2ML IJ SOLN
INTRAMUSCULAR | Status: DC | PRN
  Administered 2019-08-23 (×2): 1 mg via INTRAVENOUS

## 2019-08-23 MED ORDER — FENTANYL CITRATE (PF) 100 MCG/2ML IJ SOLN
INTRAMUSCULAR | Status: DC | PRN
  Administered 2019-08-23 (×2): 25 ug via INTRAVENOUS

## 2019-08-23 MED ORDER — LIDOCAINE HCL (PF) 1 % IJ SOLN
INTRAMUSCULAR | Status: AC
  Filled 2019-08-23: qty 30

## 2019-08-23 MED ORDER — HYDRALAZINE HCL 20 MG/ML IJ SOLN: 5.0000 mg | INTRAMUSCULAR | Status: DC | PRN

## 2019-08-23 MED ORDER — IODIXANOL 320 MG/ML IV SOLN
INTRAVENOUS | Status: DC | PRN
  Administered 2019-08-23: 10:00:00 70 mL via INTRA_ARTERIAL

## 2019-08-23 MED ORDER — LIDOCAINE HCL (PF) 1 % IJ SOLN
INTRAMUSCULAR | Status: DC | PRN
  Administered 2019-08-23: 15 mL

## 2019-08-23 MED ORDER — SODIUM CHLORIDE 0.9 % WEIGHT BASED INFUSION: 1.0000 mL/kg/h | INTRAVENOUS | Status: DC

## 2019-08-23 MED ORDER — NITROGLYCERIN 1 MG/10 ML FOR IR/CATH LAB
INTRA_ARTERIAL | Status: AC
  Filled 2019-08-23: qty 10

## 2019-08-23 MED ORDER — NITROGLYCERIN 1 MG/10 ML FOR IR/CATH LAB
INTRA_ARTERIAL | Status: DC | PRN
  Administered 2019-08-23 (×2): 200 ug via INTRA_ARTERIAL

## 2019-08-23 MED ORDER — HEPARIN (PORCINE) IN NACL 1000-0.9 UT/500ML-% IV SOLN
INTRAVENOUS | Status: AC
  Filled 2019-08-23: qty 1000

## 2019-08-23 MED ORDER — ACETAMINOPHEN 325 MG PO TABS
650.0000 mg | ORAL_TABLET | ORAL | Status: DC | PRN
  Administered 2019-08-23: 650 mg via ORAL
  Filled 2019-08-23: qty 2

## 2019-08-23 SURGICAL SUPPLY — 12 items
CATH ANGIO 5F BER2 100CM (CATHETERS) ×1 IMPLANT
CATH ANGIO 5F PIGTAIL 100CM (CATHETERS) ×1 IMPLANT
CLOSURE MYNX CONTROL 5F (Vascular Products) ×1 IMPLANT
KIT MICROPUNCTURE NIT STIFF (SHEATH) ×2 IMPLANT
KIT PV (KITS) ×2 IMPLANT
SHEATH PINNACLE 5F 10CM (SHEATH) ×2 IMPLANT
SHEATH PROBE COVER 6X72 (BAG) ×2 IMPLANT
SYR MEDRAD MARK V 150ML (SYRINGE) ×1 IMPLANT
TRANSDUCER W/STOPCOCK (MISCELLANEOUS) ×2 IMPLANT
TRAY PV CATH (CUSTOM PROCEDURE TRAY) ×2 IMPLANT
WIRE BENTSON .035X145CM (WIRE) ×1 IMPLANT
WIRE HI TORQ VERSACORE J 260CM (WIRE) ×1 IMPLANT

## 2019-08-23 NOTE — Op Note (Signed)
Patient name: Michelle Villa MRN: [id number] DOB: 05/06/1986 Sex: female  08/23/2019 Pre-operative Diagnosis: Arterial thoracic outlet of left upper extremity status post first rib and left subclavian stent with ongoing numbness and tingling in her left upper extremity Post-operative diagnosis:  Same Surgeon:  Marty Heck, MD Procedure Performed: 1.  Ultrasound guided access of the right common femoral artery 2.  Arch aortogram 3.  Left upper extremity arteriogram with selection of second-order branches 4.  34 minutes of monitored moderate conscious sedation time 5.  Mynx closure of the right common femoral artery  Indications: Patient is a 34 year old female who underwent left first rib resection and stenting of her left subclavian artery in 2018 for left upper extremity arterial thoracic outlet syndrome.  She had preoperative arterial embolization to the left hand.  She presents today for left upper extremity arteriogram given ongoing cold intolerance as well as numbness and tingling in the left hand after risk and benefits are discussed.  Findings:   Limited arch aortogram showed no significant plaque or thrombus in the ascending arch or proximal descending thoracic aorta.  The arch vessels including the innominate as well as the left carotid and left subclavian were all patent proximally with no flow-limiting stenosis.  The left subclavian stent is widely patent.  The remainder of the left subclavian artery was widely patent.  The left subclavian artery was then selected with a BER-2 catheter.  The left axillary and brachial arteries were widely patent with no flow-limiting stenosis.  It appears she has an occluded radial artery shortly after takeoff with a large interosseous branch that reconstitutes the radial artery retrograde at the wrist through mulitple collaterals at the wrist with filling of the superficial palmar arch as well.  Otherwise the radial artery appears occluded  throughout the majority of its course.  The ulnar artery also appears to occlude shortly after takeoff with a large collateral in the forearm that provides flow into the hand.   Procedure:  The patient was identified in the holding area and taken to room 8.  The patient was then placed supine on the table and prepped and draped in the usual sterile fashion.  A time out was called.  Ultrasound was used to evaluate the right common femoral artery.  It was patent .  A digital ultrasound image was acquired.  A micropuncture needle was used to access the right common femoral artery under ultrasound guidance.  An 018 wire was advanced without resistance and a micropuncture sheath was placed.  The 018 wire was removed and a benson wire was placed.  The micropuncture sheath was exchanged for a 5 french sheath.  Patient was given 5000 units of IV heparin.  Subsequent advanced a Bentson wire into her thoracic aorta with a pigtail catheter.  Ultimately the pigtail catheter was advanced into the ascending aorta and a limited arch aortogram was obtained.  Then used a BER-2 catheter and a versa core wire and selected the left subclavian artery.  Ultimately the wire was advanced across the subclavian stent that was widely patent and ultimately the catheter was placed in the left axillary artery.  I then gave nitroglycerin throughout the case (400 mcg total) to prevent spasm in the left upper extremity.  We then placed the left arm in anatomic position with the palm facing up.  Left lower extremity runoff was then obtained including the hand with pertinent findings noted above.  Once we were satisfied with images wires and catheters were  removed.  A mynx closure device was deployed in the right common femoral artery and pressure was held for 5 minutes.  She will be taken to PACU in stable condition.  Plan: I will let Dr. Darrick Penna review the images for further recommendations.  Cephus Shelling, MD [facility] [address] Office: [phone]   Cephus Shelling

## 2019-08-23 NOTE — H&P (Signed)
HPI: Patient is a 34 year old female who underwent left first rib resection and stenting of her left subclavian artery March 2018. This was done for vascular thoracic outlet syndrome. She had had preoperative arterial embolization to her left hand. She still has cold intolerance in her left hand. She has been having some intermittent numbness and tingling in her left hand recently. She feels like the left arm may be slightly weaker as well. She is still working full-time. She did COVID in the fall but recovered from this. She is currently on aspirin 81 mg daily.            Current Outpatient Medications on File Prior to Visit  Medication Sig Dispense Refill  . acetaminophen (TYLENOL) 325 MG tablet Take 650 mg by mouth every 6 (six) hours as needed for moderate pain or headache.     . ALPRAZolam (XANAX) 1 MG tablet 1 mg 2 (two) times daily as needed.     Marland Kitchen aspirin EC 81 MG tablet Take 81 mg by mouth daily.    . cyclobenzaprine (FLEXERIL) 10 MG tablet Take 10 mg by mouth at bedtime.    Marland Kitchen ECHINACEA PO Take 1 capsule by mouth daily.     Marland Kitchen escitalopram (LEXAPRO) 10 MG tablet Take 15 mg by mouth daily before breakfast.     . ibuprofen (ADVIL) 800 MG tablet Take 1 tablet (800 mg total) by mouth every 8 (eight) hours as needed. 30 tablet 1  . Multiple Vitamin (MULTIVITAMIN) capsule Take 1 capsule by mouth daily.     . Multiple Vitamins-Minerals (MULTI COMPLETE PO) Take 1 tablet by mouth daily.     . traMADol (ULTRAM) 50 MG tablet Take 50 mg by mouth every 6 (six) hours as needed.    . vitamin B-12 (CYANOCOBALAMIN) 250 MCG tablet Take 250 mcg by mouth daily.     No current facility-administered medications on file prior to visit.       Past Medical History:  Diagnosis Date  . Anxiety   . Depression   . Raynaud phenomenon   . Thyroid cyst    3 tumors on thyroid- all biopsied & benign    Social History        Socioeconomic History  . Marital status: Married    Spouse name: Not on file  .  Number of children: Not on file  . Years of education: Not on file  . Highest education level: Not on file  Occupational History  . Occupation: waitress  Tobacco Use  . Smoking status: Never Smoker  . Smokeless tobacco: Never Used  Substance and Sexual Activity  . Alcohol use: Yes    Alcohol/week: 7.0 standard drinks    Types: 7 Standard drinks or equivalent per week    Comment: 7 per week average  . Drug use: No  . Sexual activity: Not on file  Other Topics Concern  . Not on file  Social History Narrative  . Not on file   Social Determinants of Health      Financial Resource Strain:   . Difficulty of Paying Living Expenses: Not on file  Food Insecurity:   . Worried About Charity fundraiser in the Last Year: Not on file  . Ran Out of Food in the Last Year: Not on file  Transportation Needs:   . Lack of Transportation (Medical): Not on file  . Lack of Transportation (Non-Medical): Not on file  Physical Activity:   . Days of Exercise per Week: Not  on file  . Minutes of Exercise per Session: Not on file  Stress:   . Feeling of Stress : Not on file  Social Connections:   . Frequency of Communication with Friends and Family: Not on file  . Frequency of Social Gatherings with Friends and Family: Not on file  . Attends Religious Services: Not on file  . Active Member of Clubs or Organizations: Not on file  . Attends Banker Meetings: Not on file  . Marital Status: Not on file  Intimate Partner Violence:   . Fear of Current or Ex-Partner: Not on file  . Emotionally Abused: Not on file  . Physically Abused: Not on file  . Sexually Abused: Not on file   Physical exam:     Vitals:   07/13/19 1104  BP: 129/86  Pulse: 100  Resp: 20  Temp: 98.2 F (36.8 C)  SpO2: 97%  Weight: 166 lb 9.6 oz (75.6 kg)  Height: 5\' 3"  (1.6 m)   Extremities: Left upper extremity no palpable radial or ulnar pulse. She does have a 2+ high brachial pulse  Skin: Left hand is pink  warm well perfused on exam  Data: Patient had a duplex ultrasound today which showed the left subclavian stent was widely patent with triphasic waveforms and there were triphasic flow in the brachial radial and ulnar arteries in the left arm   Assessment: 34 year old female who underwent left first rib resection and stenting of her left subclavian artery March 2018 by Dr. April 2018. This was done for vascular thoracic outlet syndrome. Patient has had some symptoms of numbness and tingling in her left arm recently. However, duplex ultrasound did not really show anything worrisome today.  Plan: Plan arch and left upper extremity arteriogram to ensure no flow limiting stenosis or lesions to improve flow to left hand given ongoing symptoms particularly bothersome at work.  Darrick Penna, MD [facility] [place] Office: [phone]

## 2019-08-23 NOTE — Discharge Instructions (Signed)
Angiogram, Care After This sheet gives you information about how to care for yourself after your procedure. Your health care provider may also give you more specific instructions. If you have problems or questions, contact your health care provider. What can I expect after the procedure? After the procedure, it is common to have bruising and tenderness at the catheter insertion area. Follow these instructions at home: Insertion site care  Follow instructions from your health care provider about how to take care of your insertion site. Make sure you: ? Wash your hands with soap and water before you change your bandage (dressing). If soap and water are not available, use hand sanitizer. ? Change your dressing as told by your health care provider. ? Leave stitches (sutures), skin glue, or adhesive strips in place. These skin closures may need to stay in place for 2 weeks or longer. If adhesive strip edges start to loosen and curl up, you may trim the loose edges. Do not remove adhesive strips completely unless your health care provider tells you to do that.  Do not take baths, swim, or use a hot tub until your health care provider approves.  You may shower 24-48 hours after the procedure or as told by your health care provider. ? Gently wash the site with plain soap and water. ? Pat the area dry with a clean towel. ? Do not rub the site. This may cause bleeding.  Do not apply powder or lotion to the site. Keep the site clean and dry.  Check your insertion site every day for signs of infection. Check for: ? Redness, swelling, or pain. ? Fluid or blood. ? Warmth. ? Pus or a bad smell. Activity  Rest as told by your health care provider, usually for 1-2 days.  Do not lift anything that is heavier than 10 lbs. (4.5 kg) or as told by your health care provider.  Do not drive for 24 hours if you were given a medicine to help you relax (sedative).  Do not drive or use heavy machinery while  taking prescription pain medicine. General instructions   Return to your normal activities as told by your health care provider, usually in about a week. Ask your health care provider what activities are safe for you.  If the catheter site starts bleeding, lie flat and put pressure on the site. If the bleeding does not stop, get help right away. This is a medical emergency.  Drink enough fluid to keep your urine clear or pale yellow. This helps flush the contrast dye from your body.  Take over-the-counter and prescription medicines only as told by your health care provider.  Keep all follow-up visits as told by your health care provider. This is important. Contact a health care provider if:  You have a fever or chills.  You have redness, swelling, or pain around your insertion site.  You have fluid or blood coming from your insertion site.  The insertion site feels warm to the touch.  You have pus or a bad smell coming from your insertion site.  You have bruising around the insertion site.  You notice blood collecting in the tissue around the catheter site (hematoma). The hematoma may be painful to the touch. Get help right away if:  You have severe pain at the catheter insertion area.  The catheter insertion area swells very fast.  The catheter insertion area is bleeding, and the bleeding does not stop when you hold steady pressure on the area.    The area near or just beyond the catheter insertion site becomes pale, cool, tingly, or numb. These symptoms may represent a serious problem that is an emergency. Do not wait to see if the symptoms will go away. Get medical help right away. Call your local emergency services (911 in the [place] Do not drive yourself to the hospital. Summary  After the procedure, it is common to have bruising and tenderness at the catheter insertion area.  After the procedure, it is important to rest and drink plenty of fluids.  Do not take baths,  swim, or use a hot tub until your health care provider says it is okay to do so. You may shower 24-48 hours after the procedure or as told by your health care provider.  If the catheter site starts bleeding, lie flat and put pressure on the site. If the bleeding does not stop, get help right away. This is a medical emergency. This information is not intended to replace advice given to you by your health care provider. Make sure you discuss any questions you have with your health care provider. Document Revised: 06/11/2017 Document Reviewed: 06/03/2016 Elsevier Patient Education  2020 [employer]  

## 2019-08-28 DIAGNOSIS — F330 Major depressive disorder, recurrent, mild: Secondary | ICD-10-CM | POA: Diagnosis not present

## 2019-09-07 ENCOUNTER — Ambulatory Visit (HOSPITAL_COMMUNITY)
Admission: RE | Admit: 2019-09-07 | Discharge: 2019-09-07 | Disposition: A | Discharge status: Home / Self Care | Payer: BC Managed Care – PPO | Source: Ambulatory Visit | Attending: Surgery | Admitting: Surgery

## 2019-09-07 ENCOUNTER — Ambulatory Visit: Payer: BC Managed Care – PPO | Admitting: Vascular Surgery

## 2019-09-07 ENCOUNTER — Encounter: Payer: Self-pay | Admitting: Vascular Surgery

## 2019-09-07 ENCOUNTER — Other Ambulatory Visit: Payer: Self-pay

## 2019-09-07 VITALS — BP 131/86 | HR 85 | Temp 98.1°F | Resp 20 | Ht 63.0 in | Wt 165.0 lb

## 2019-09-07 DIAGNOSIS — G540 Brachial plexus disorders: Secondary | ICD-10-CM

## 2019-09-07 DIAGNOSIS — I998 Other disorder of circulatory system: Secondary | ICD-10-CM

## 2019-09-07 NOTE — Progress Notes (Signed)
Patient name: Michelle Villa MRN: [id number] DOB: June 16, 1986 Sex: female  HPI: Michelle Villa is a 34 y.o. female, who returns for follow-up today.  She previously underwent left first rib resection and stenting of her left subclavian artery March 2018.  This was done for vascular thoracic outlet syndrome with preoperative arterial embolization to her left hand.  She still has significant cold intolerance to her left hand.  She also has intermittent numbness and tingling.  She also thinks that her left arm is slightly weaker.  She is still working full-time at [employer].  She did have Covid in the fall but recovered from this.  She is currently on aspirin 81 mg a day.  She recently underwent arteriogram by Dr. Chestine Spore.  She returns today for further discussions regarding this.    Past Medical History:  Diagnosis Date  . Anxiety   . Depression   . Raynaud phenomenon   . Thyroid cyst    3 tumors on thyroid- all biopsied & benign    Past Surgical History:  Procedure Laterality Date  . AORTIC ARCH ANGIOGRAPHY N/A 09/11/2016   Procedure: Aortic Arch Angiography;  Surgeon: Sherren Kerns, MD;  Location: [facility] INVASIVE CV LAB;  Service: Cardiovascular;  Laterality: N/A;  . EMBOLECTOMY Left 09/28/2016   Procedure: Left Brachial, radial, ulnar and intraosseous artery thrombectomy;  Surgeon: Sherren Kerns, MD;  Location: [facility] OR;  Service: Vascular;  Laterality: Left;  . HYSTEROSCOPY N/A 12/28/2018   Procedure: HYSTEROSCOPY/D&C;  Surgeon: Gerald Leitz, MD;  Location: [facility] OR;  Service: Gynecology;  Laterality: N/A;  . I & D EXTREMITY Left 09/28/2016   Procedure: EVACUATION HEMATOMA NECK;  Surgeon: Sherren Kerns, MD;  Location: [facility] OR;  Service: Vascular;  Laterality: Left;  . LAPAROSCOPY Left 12/28/2018   Procedure: Operative Laparoscopy with left oophorectomy;  Surgeon: Gerald Leitz, MD;  Location: [facility] OR;  Service: Gynecology;  Laterality: Left;  . PATCH ANGIOPLASTY Left 09/28/2016   Procedure:  PATCH ANGIOPLASTY BRACHIAL ARTERY USING VIEN HARVESTED FROM PATIENT;  Surgeon: Sherren Kerns, MD;  Location: [facility] OR;  Service: Vascular;  Laterality: Left;  . PERIPHERAL VASCULAR INTERVENTION Left 09/28/2016   Procedure: LEFT SUBCLAVIAN STENT;  Surgeon: Sherren Kerns, MD;  Location: [facility] OR;  Service: Vascular;  Laterality: Left;  . RIB RESECTION Left 09/28/2016   Procedure: FIRST RIB RESECTION;  Surgeon: Sherren Kerns, MD;  Location: [facility] OR;  Service: Vascular;  Laterality: Left;  . UPPER EXTREMITY ANGIOGRAPHY N/A 09/11/2016   Procedure: Upper Extremity Angiography - Left;  Surgeon: Sherren Kerns, MD;  Location: [facility] INVASIVE CV LAB;  Service: Cardiovascular;  Laterality: N/A;  . UPPER EXTREMITY ANGIOGRAPHY Left 08/23/2019   Procedure: UPPER EXTREMITY ANGIOGRAPHY - Left;  Surgeon: Cephus Shelling, MD;  Location: [facility] INVASIVE CV LAB;  Service: Cardiovascular;  Laterality: Left;  . WISDOM TOOTH EXTRACTION      Family History  Problem Relation Age of Onset  . Hypertension Father   . Hyperlipidemia Father   . Neuropathy Neg Hx     SOCIAL HISTORY: Social History   Socioeconomic History  . Marital status: Married    Spouse name: Not on file  . Number of children: Not on file  . Years of education: Not on file  . Highest education level: Not on file  Occupational History  . Occupation: waitress  Tobacco Use  . Smoking status: Never Smoker  . Smokeless tobacco: Never Used  Substance and Sexual Activity  . Alcohol  use: Yes    Alcohol/week: 7.0 standard drinks    Types: 7 Standard drinks or equivalent per week    Comment: 7 per week average  . Drug use: No  . Sexual activity: Not on file  Other Topics Concern  . Not on file  Social History Narrative  . Not on file   Social Determinants of Health   Financial Resource Strain:   . Difficulty of Paying Living Expenses: Not on file  Food Insecurity:   . Worried About Charity fundraiser in the Last Year: Not on file  . Ran  Out of Food in the Last Year: Not on file  Transportation Needs:   . Lack of Transportation (Medical): Not on file  . Lack of Transportation (Non-Medical): Not on file  Physical Activity:   . Days of Exercise per Week: Not on file  . Minutes of Exercise per Session: Not on file  Stress:   . Feeling of Stress : Not on file  Social Connections:   . Frequency of Communication with Friends and Family: Not on file  . Frequency of Social Gatherings with Friends and Family: Not on file  . Attends Religious Services: Not on file  . Active Member of Clubs or Organizations: Not on file  . Attends Archivist Meetings: Not on file  . Marital Status: Not on file  Intimate Partner Violence:   . Fear of Current or Ex-Partner: Not on file  . Emotionally Abused: Not on file  . Physically Abused: Not on file  . Sexually Abused: Not on file    Allergies  Allergen Reactions  . Other Hives    CITRUS     Current Outpatient Medications  Medication Sig Dispense Refill  . acetaminophen (TYLENOL) 325 MG tablet Take 650 mg by mouth every 6 (six) hours as needed for moderate pain or headache.     . ALPRAZolam (XANAX) 1 MG tablet Take 0.5 mg by mouth 2 (two) times daily as needed for anxiety.     Marland Kitchen aspirin EC 81 MG tablet Take 81 mg by mouth daily.    . calcium carbonate (TUMS - DOSED IN MG ELEMENTAL CALCIUM) 500 MG chewable tablet Chew 1-2 tablets by mouth daily as needed for indigestion or heartburn.    . Cyanocobalamin (B-12 PO) Take 1 tablet by mouth daily.    Marland Kitchen ECHINACEA PO Take 1 capsule by mouth daily.     Marland Kitchen escitalopram (LEXAPRO) 20 MG tablet Take 20 mg by mouth daily.    . Multiple Vitamin (MULTIVITAMIN) capsule Take 1 capsule by mouth daily.     . traMADol (ULTRAM) 50 MG tablet Take 50 mg by mouth daily as needed for moderate pain.      No current facility-administered medications for this visit.    ROS:   General:  No weight loss, Fever, chills  HEENT: No recent headaches, no  nasal bleeding, no visual changes, no sore throat  Neurologic: No dizziness, blackouts, seizures. No recent symptoms of stroke or mini- stroke. No recent episodes of slurred speech, or temporary blindness.  Cardiac: No recent episodes of chest pain/pressure, no shortness of breath at rest.  No shortness of breath with exertion.  Denies history of atrial fibrillation or irregular heartbeat  Vascular: No history of rest pain in feet.  No history of claudication.  No history of non-healing ulcer, No history of DVT   Pulmonary: No home oxygen, no productive cough, no hemoptysis,  No asthma or wheezing  Musculoskeletal:  [ ]  Arthritis, [ ]  Low back pain,  [ ]  Joint pain  Hematologic:No history of hypercoagulable state.  No history of easy bleeding.  No history of anemia  Gastrointestinal: No hematochezia or melena,  No gastroesophageal reflux, no trouble swallowing  Urinary: [ ]  chronic Kidney disease, [ ]  on HD - [ ]  MWF or [ ]  TTHS, [ ]  Burning with urination, [ ]  Frequent urination, [ ]  Difficulty urinating;   Skin: No rashes  Psychological: No history of anxiety,  No history of depression   Physical Examination  Vitals:   09/07/19 1346  BP: 131/86  Pulse: 85  Resp: 20  Temp: 98.1 F (36.7 C)  SpO2: 96%  Weight: 165 lb (74.8 kg)  Height: 5\' 3"  (1.6 m)    Body mass index is 29.23 kg/m.  General:  Alert and oriented, no acute distress HEENT: Normal Neck: No JVD Cardiac: Regular Rate and Rhythm Skin: No rash Extremity Pulses: Left upper extremity absent radial ulnar pulse 2+ left brachial pulse Musculoskeletal: No deformity or edema  Neurologic: Upper and lower extremity motor 5/5 and symmetric  DATA:  I reviewed the patient's recent arteriogram.  This shows a widely patent left subclavian axillary and brachial artery in the left arm.  Her stent is widely patent.  She has occlusion of the left ulnar and radial arteries.  The interosseous artery resupplies and  reconstitutes the radial artery at the level of the wrist.  Patient had an upper extremity and lower extremity vein mapping performed today.  Right greater saphenous was 4 mm left greater saphenous 3 mm left upper arm cephalic vein 4 to 5 mm right cephalic vein 3 to 4 mm.  Forearm cephalic vein in the left arm was fairly small.  ASSESSMENT: Patient with mildly to moderately disabling symptoms in her left upper extremity with numbness tingling cold intolerance secondary to arterial occlusive disease from prior embolic event.  I discussed with the patient today that she does have a reasonable target to consider a left brachial to radial artery bypass most likely using her upper arm cephalic vein.  I discussed with her the risk benefits possible complications and the fact that durability of this may be at most 5 years 75% patency.  I also discussed with her the morbidity of this requiring several incisions in her left upper extremity to perform the bypass.   PLAN: Patient is going to think about options of bypass versus continue conservative management.  She will call in the near future if she wishes to schedule a left brachial to radial artery bypass using left upper extremity vein.   , MD [facility] Office: [phone] Pager: [phone]

## 2019-10-26 DIAGNOSIS — M940 Chondrocostal junction syndrome [Tietze]: Secondary | ICD-10-CM | POA: Diagnosis not present

## 2019-11-06 DIAGNOSIS — F411 Generalized anxiety disorder: Secondary | ICD-10-CM | POA: Diagnosis not present

## 2019-11-09 NOTE — Progress Notes (Deleted)
Cardiology Office Note   Date:  11/09/2019   ID:  XYLA, LEISNER [birth year], MRN [id number]  PCP:  Maurice Small, MD  Cardiologist:   Sybilla Malhotra [place], MD   No chief complaint on file.     History of Present Illness: Michelle Villa is a 34 y.o. female who is seen at the request of Dr Justin Mend for cardiac evaluation. She has a history of thoracic outlet syndrome. She previously underwent left first rib resection and stenting of her left subclavian artery March 2018.  This was done for vascular thoracic outlet syndrome with preoperative arterial embolization to her left hand. She underwent arteriogram in February this year shows a widely patent left subclavian axillary and brachial artery in the left arm.  Her stent is widely patent.  She has occlusion of the left ulnar and radial arteries.  The interosseous artery resupplies and reconstitutes the radial artery at the level of the wrist. She is followed closely by vascular surgery and some consideration given for left brachial to radial bypass.     Past Medical History:  Diagnosis Date  . Anxiety   . Depression   . Raynaud phenomenon   . Thyroid cyst    3 tumors on thyroid- all biopsied & benign     Past Surgical History:  Procedure Laterality Date  . AORTIC ARCH ANGIOGRAPHY N/A 09/11/2016   Procedure: Aortic Arch Angiography;  Surgeon: Elam Dutch, MD;  Location: [place] CV LAB;  Service: Cardiovascular;  Laterality: N/A;  . EMBOLECTOMY Left 09/28/2016   Procedure: Left Brachial, radial, ulnar and intraosseous artery thrombectomy;  Surgeon: Elam Dutch, MD;  Location: [facility] OR;  Service: Vascular;  Laterality: Left;  . HYSTEROSCOPY N/A 12/28/2018   Procedure: HYSTEROSCOPY/D&C;  Surgeon: Christophe Louis, MD;  Location: [place];  Service: Gynecology;  Laterality: N/A;  . I & D EXTREMITY Left 09/28/2016   Procedure: EVACUATION HEMATOMA NECK;  Surgeon: Elam Dutch, MD;  Location: [place];  Service: Vascular;  Laterality: Left;    . LAPAROSCOPY Left 12/28/2018   Procedure: Operative Laparoscopy with left oophorectomy;  Surgeon: Christophe Louis, MD;  Location: [place];  Service: Gynecology;  Laterality: Left;  . PATCH ANGIOPLASTY Left 09/28/2016   Procedure: PATCH ANGIOPLASTY BRACHIAL ARTERY USING VIEN HARVESTED FROM PATIENT;  Surgeon: Elam Dutch, MD;  Location: [place];  Service: Vascular;  Laterality: Left;  . PERIPHERAL VASCULAR INTERVENTION Left 09/28/2016   Procedure: LEFT SUBCLAVIAN STENT;  Surgeon: Elam Dutch, MD;  Location: [place];  Service: Vascular;  Laterality: Left;  . RIB RESECTION Left 09/28/2016   Procedure: FIRST RIB RESECTION;  Surgeon: Elam Dutch, MD;  Location: [place];  Service: Vascular;  Laterality: Left;  . UPPER EXTREMITY ANGIOGRAPHY N/A 09/11/2016   Procedure: Upper Extremity Angiography - Left;  Surgeon: Elam Dutch, MD;  Location: [place] CV LAB;  Service: Cardiovascular;  Laterality: N/A;  . UPPER EXTREMITY ANGIOGRAPHY Left 08/23/2019   Procedure: UPPER EXTREMITY ANGIOGRAPHY - Left;  Surgeon: Marty Heck, MD;  Location: [place] CV LAB;  Service: Cardiovascular;  Laterality: Left;  . WISDOM TOOTH EXTRACTION       Current Outpatient Medications  Medication Sig Dispense Refill  . acetaminophen (TYLENOL) 325 MG tablet Take 650 mg by mouth every 6 (six) hours as needed for moderate pain or headache.     . ALPRAZolam (XANAX) 1 MG tablet Take 0.5 mg by mouth 2 (two) times daily as needed for anxiety.     Marland Kitchen  aspirin EC 81 MG tablet Take 81 mg by mouth daily.    . calcium carbonate (TUMS - DOSED IN MG ELEMENTAL CALCIUM) 500 MG chewable tablet Chew 1-2 tablets by mouth daily as needed for indigestion or heartburn.    . Cyanocobalamin (B-12 PO) Take 1 tablet by mouth daily.    Marland Kitchen ECHINACEA PO Take 1 capsule by mouth daily.     Marland Kitchen escitalopram (LEXAPRO) 20 MG tablet Take 20 mg by mouth daily.    . Multiple Vitamin (MULTIVITAMIN) capsule Take 1 capsule by mouth daily.     . traMADol  (ULTRAM) 50 MG tablet Take 50 mg by mouth daily as needed for moderate pain.      No current facility-administered medications for this visit.    Allergies:   Other    Social History:  The patient  reports that she has never smoked. She has never used smokeless tobacco. She reports current alcohol use of about 7.0 standard drinks of alcohol per week. She reports that she does not use drugs.   Family History:  The patient's ***family history includes Hyperlipidemia in her father; Hypertension in her father.    ROS:  Please see the history of present illness.   Otherwise, review of systems are positive for {NONE DEFAULTED:18576::"none"}.   All other systems are reviewed and negative.    PHYSICAL EXAM: VS:  There were no vitals taken for this visit. , BMI There is no height or weight on file to calculate BMI. GEN: Well nourished, well developed, in no acute distress  HEENT: normal  Neck: no JVD, carotid bruits, or masses Cardiac: ***RRR; no murmurs, rubs, or gallops,no edema  Respiratory:  clear to auscultation bilaterally, normal work of breathing GI: soft, nontender, nondistended, + BS MS: no deformity or atrophy  Skin: warm and dry, no rash Neuro:  Strength and sensation are intact Psych: euthymic mood, full affect   EKG:  EKG {ACTION; IS/IS LOV:[id number]} ordered today. The ekg ordered today demonstrates ***   Recent Labs: 12/22/2018: Platelets 183 08/23/2019: BUN 11; Creatinine, Ser 0.60; Hemoglobin 13.3; Potassium 4.2; Sodium 136    Lipid Panel No results found for: CHOL, TRIG, HDL, CHOLHDL, VLDL, LDLCALC, LDLDIRECT    Wt Readings from Last 3 Encounters:  09/07/19 165 lb (74.8 kg)  08/23/19 163 lb (73.9 kg)  07/13/19 166 lb 9.6 oz (75.6 kg)      Other studies Reviewed: Additional studies/ records that were reviewed today include: ***. Review of the above records demonstrates: ***   ASSESSMENT AND PLAN:  1.  ***   Current medicines are reviewed at length  with the patient today.  The patient {ACTIONS; HAS/DOES NOT HAVE:19233} concerns regarding medicines.  The following changes have been made:  {PLAN; NO CHANGE:13088:s}  Labs/ tests ordered today include: *** No orders of the defined types were placed in this encounter.    Disposition:   FU with *** in {gen number 1-88:416606} {Days to years:10300}  Signed, Michelle Newlun [place], MD  11/09/2019 12:58 PM    [facility] [address], [place], [place], [address] Phone [phone], Fax [phone]

## 2019-11-13 ENCOUNTER — Ambulatory Visit: Payer: BC Managed Care – PPO | Admitting: Cardiology

## 2019-11-15 NOTE — Progress Notes (Signed)
Cardiology Office Note:   Date:  11/16/2019  NAME:  Michelle Villa    MRN: [id number] DOB:  October 14, 1985   PCP:  Shirlean Mylar, MD  Cardiologist:  No primary care provider on file.   Referring MD: Shirlean Mylar, MD   Chief Complaint  Patient presents with  . Chest Pain    History of Present Illness:   Michelle Villa is a 34 y.o. female with a hx of vascular thoracic outlet syndrome s/p L first rib removal and stenting of L subclavian who is being seen today for the evaluation of chest pain at the request of Shirlean Mylar, MD.  She reports since January, 5 months, she gets dull and sharp chest pain.  This can occur anytime during the day.  Last 3 to 4 minutes and resolves without real intervention.  No identifiable trigger.  She reports she has maintained a high level of activity but still can get the pain.  She is walking 30 minutes 3 times per week with her husband without any major limitations.  She also has done exercise classes such as pure barre.  She reports she deals with chronic left hand numbness.  She reports with exertion or activity such as her job she does get difficulty using her left hand.  She has been evaluated by vascular surgery due to her known vascular thoracic outlet syndrome with stenting of her left subclavian.  She does have occlusions of her left radial and left ulnar arteries and they have offered her bypass surgery.  She reports that she is going to continue medications for now.  She reports she is concerned about her chest pain given her vascular issue.  Her EKG today is normal without any acute ischemic changes.  Her cardiovascular exam is benign.  She does report that she is anxious at baseline but not having any other stressful situations other than what is expected in her life right now.  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  She reports Covid was tough.  She does not smoke or drink alcohol in excess.  She does use marijuana every other day.  She  denies any shortness of breath with the chest pain.  It appears that main symptoms are dull and sharp chest pain that occur without identifiable trigger.  Problem List 1. Vascular Thoracic Outlet syndrome -70% eccentric plaque in L subclavian -s/p removal of L first rib -S/p stenting of L subclavian -c/b embolic event to L upper extremity with occluded L radial artery and occluded L ulnar artery    Past Medical History: Past Medical History:  Diagnosis Date  . Anxiety   . Depression   . Raynaud phenomenon   . Thyroid cyst    3 tumors on thyroid- all biopsied & benign     Past Surgical History: Past Surgical History:  Procedure Laterality Date  . AORTIC ARCH ANGIOGRAPHY N/A 09/11/2016   Procedure: Aortic Arch Angiography;  Surgeon: Sherren Kerns, MD;  Location: [facility] INVASIVE CV LAB;  Service: Cardiovascular;  Laterality: N/A;  . EMBOLECTOMY Left 09/28/2016   Procedure: Left Brachial, radial, ulnar and intraosseous artery thrombectomy;  Surgeon: Sherren Kerns, MD;  Location: [facility] OR;  Service: Vascular;  Laterality: Left;  . HYSTEROSCOPY N/A 12/28/2018   Procedure: HYSTEROSCOPY/D&C;  Surgeon: Gerald Leitz, MD;  Location: [facility] OR;  Service: Gynecology;  Laterality: N/A;  . I & D EXTREMITY Left 09/28/2016   Procedure: EVACUATION HEMATOMA NECK;  Surgeon: Sherren Kerns, MD;  Location: [facility]  OR;  Service: Vascular;  Laterality: Left;  . LAPAROSCOPY Left 12/28/2018   Procedure: Operative Laparoscopy with left oophorectomy;  Surgeon: Gerald Leitz, MD;  Location: [facility] OR;  Service: Gynecology;  Laterality: Left;  . PATCH ANGIOPLASTY Left 09/28/2016   Procedure: PATCH ANGIOPLASTY BRACHIAL ARTERY USING VIEN HARVESTED FROM PATIENT;  Surgeon: Sherren Kerns, MD;  Location: [facility] OR;  Service: Vascular;  Laterality: Left;  . PERIPHERAL VASCULAR INTERVENTION Left 09/28/2016   Procedure: LEFT SUBCLAVIAN STENT;  Surgeon: Sherren Kerns, MD;  Location: [facility] OR;  Service: Vascular;  Laterality: Left;  . RIB  RESECTION Left 09/28/2016   Procedure: FIRST RIB RESECTION;  Surgeon: Sherren Kerns, MD;  Location: [facility] OR;  Service: Vascular;  Laterality: Left;  . UPPER EXTREMITY ANGIOGRAPHY N/A 09/11/2016   Procedure: Upper Extremity Angiography - Left;  Surgeon: Sherren Kerns, MD;  Location: [facility] INVASIVE CV LAB;  Service: Cardiovascular;  Laterality: N/A;  . UPPER EXTREMITY ANGIOGRAPHY Left 08/23/2019   Procedure: UPPER EXTREMITY ANGIOGRAPHY - Left;  Surgeon: Cephus Shelling, MD;  Location: [facility] INVASIVE CV LAB;  Service: Cardiovascular;  Laterality: Left;  . WISDOM TOOTH EXTRACTION      Current Medications: Current Meds  Medication Sig  . acetaminophen (TYLENOL) 325 MG tablet Take 650 mg by mouth every 6 (six) hours as needed for moderate pain or headache.   . ALPRAZolam (XANAX) 1 MG tablet Take 0.5 mg by mouth 2 (two) times daily as needed for anxiety.   . calcium carbonate (TUMS - DOSED IN MG ELEMENTAL CALCIUM) 500 MG chewable tablet Chew 1-2 tablets by mouth daily as needed for indigestion or heartburn.  . Cyanocobalamin (B-12 PO) Take 1 tablet by mouth daily.  Marland Kitchen escitalopram (LEXAPRO) 20 MG tablet Take 20 mg by mouth daily.  . Multiple Vitamin (MULTIVITAMIN) capsule Take 1 capsule by mouth daily.   . traMADol (ULTRAM) 50 MG tablet Take 50 mg by mouth daily as needed for moderate pain.      Allergies:    Other   Social History: Social History   Socioeconomic History  . Marital status: Married    Spouse name: Not on file  . Number of children: Not on file  . Years of education: Not on file  . Highest education level: Not on file  Occupational History  . Occupation: waitress  Tobacco Use  . Smoking status: Never Smoker  . Smokeless tobacco: Never Used  Substance and Sexual Activity  . Alcohol use: Yes    Alcohol/week: 7.0 standard drinks    Types: 7 Standard drinks or equivalent per week    Comment: 7 per week average  . Drug use: No  . Sexual activity: Not on file  Other  Topics Concern  . Not on file  Social History Narrative  . Not on file   Social Determinants of Health   Financial Resource Strain:   . Difficulty of Paying Living Expenses:   Food Insecurity:   . Worried About Programme researcher, broadcasting/film/video in the Last Year:   . Barista in the Last Year:   Transportation Needs:   . Freight forwarder (Medical):   Marland Kitchen Lack of Transportation (Non-Medical):   Physical Activity:   . Days of Exercise per Week:   . Minutes of Exercise per Session:   Stress:   . Feeling of Stress :   Social Connections:   . Frequency of Communication with Friends and Family:   . Frequency of Social Gatherings with  Friends and Family:   . Attends Religious Services:   . Active Member of Clubs or Organizations:   . Attends Archivist Meetings:   Marland Kitchen Marital Status:      Family History: The patient's family history includes Hyperlipidemia in her father; Hypertension in her father. There is no history of Neuropathy.  ROS:   All other ROS reviewed and negative. Pertinent positives noted in the HPI.     EKGs/Labs/Other Studies Reviewed:   The following studies were personally reviewed by me today:  EKG:  EKG is ordered today.  The ekg ordered today demonstrates normal sinus rhythm, heart rate 76, no acute ST-T changes, no evidence of prior infarction, and was personally reviewed by me.   Recent Labs: 12/22/2018: Platelets 183 08/23/2019: BUN 11; Creatinine, Ser 0.60; Hemoglobin 13.3; Potassium 4.2; Sodium 136   Recent Lipid Panel No results found for: CHOL, TRIG, HDL, CHOLHDL, VLDL, LDLCALC, LDLDIRECT  Physical Exam:   VS:  BP (!) 134/100   Pulse 76   Ht 5\' 3"  (1.6 m)   Wt 162 lb 6.4 oz (73.7 kg)   SpO2 98%   BMI 28.77 kg/m    Wt Readings from Last 3 Encounters:  11/16/19 162 lb 6.4 oz (73.7 kg)  09/07/19 165 lb (74.8 kg)  08/23/19 163 lb (73.9 kg)    General: Well nourished, well developed, in no acute distress Heart: Atraumatic, normal size    Eyes: PEERLA, EOMI  Neck: Supple, no JVD Endocrine: No thryomegaly Cardiac: Normal S1, S2; RRR; no murmurs, rubs, or gallops Lungs: Clear to auscultation bilaterally, no wheezing, rhonchi or rales  Abd: Soft, nontender, no hepatomegaly  Ext: No edema, diminished pulses in the left upper extremity, normal pulses in the right upper extremity and right lower extremities Musculoskeletal: No deformities, BUE and BLE strength normal and equal Skin: Warm and dry, no rashes   Neuro: Alert and oriented to person, place, time, and situation, CNII-XII grossly intact, no focal deficits  Psych: Normal mood and affect   ASSESSMENT:   Rogan Ecklund is a 34 y.o. female who presents for the following: 1. Chest pain, unspecified type     PLAN:   1. Chest pain, unspecified type -Atypical chest pain.  Not associated exertion and alleviated at rest.  EKG is normal.  I suspect this is probably stress and anxiety related.  Her thoracic vascular outlet obstructive syndrome does not predispose her to CAD.  She did have plaque in her subclavian artery due to damage from the first left rib.  This was removed and she did undergo stenting of the left subclavian.  She does have occlusions in her left radial and left ulnar arteries which is probably from plaque embolization due to her condition.  She has been offered bypass surgery to the left arm but is reluctant to do this.  Nonetheless, given her atypical symptoms and prior history of vascular disease we will proceed with a stress echocardiogram.  I think this will reassure her and her referring physician that her heart is okay.  I would also like to avoid radiation if she has had multiple CT scans as well as recent angiograms in the catheterization laboratory which have given her considerable muscle radiation at her young age.  Disposition: Return in about 2 months (around 01/16/2020).  Medication Adjustments/Labs and Tests Ordered: Current medicines are reviewed  at length with the patient today.  Concerns regarding medicines are outlined above.  Orders Placed This Encounter  Procedures  .  EKG 12-Lead  . ECHOCARDIOGRAM STRESS TEST   No orders of the defined types were placed in this encounter.   Patient Instructions  Medication Instructions:  The current medical regimen is effective;  continue present plan and medications.  *If you need a refill on your cardiac medications before your next appointment, please call your pharmacy*  Lab: need covid testing  Testing/Procedures: Your physician has requested that you have a stress echocardiogram. For further information please visit [email or web address] Please follow instruction sheet as given.   Follow-Up: At [facility], you and your health needs are our priority.  As part of our continuing mission to provide you with exceptional heart care, we have created designated Provider Care Teams.  These Care Teams include your primary Cardiologist (physician) and Advanced Practice Providers (APPs -  Physician Assistants and Nurse Practitioners) who all work together to provide you with the care you need, when you need it.  We recommend signing up for the patient portal called "MyChart".  Sign up information is provided on this After Visit Summary.  MyChart is used to connect with patients for Virtual Visits (Telemedicine).  Patients are able to view lab/test results, encounter notes, upcoming appointments, etc.  Non-urgent messages can be sent to your provider as well.   To learn more about what you can do with MyChart, go to [email or web address].    Your next appointment:   2 month(s)  The format for your next appointment:   Virtual Visit   Provider:   Lennie Odor, MD         Signed, Lenna Gilford. Flora Lipps, MD [facility]  [address] [place], [place] [address] [phone]  11/16/2019 12:11 PM

## 2019-11-16 ENCOUNTER — Other Ambulatory Visit: Payer: Self-pay

## 2019-11-16 ENCOUNTER — Ambulatory Visit: Payer: BC Managed Care – PPO | Admitting: Cardiovascular Disease

## 2019-11-16 ENCOUNTER — Encounter: Payer: Self-pay | Admitting: Cardiovascular Disease

## 2019-11-16 VITALS — BP 134/100 | HR 76 | Ht 63.0 in | Wt 162.4 lb

## 2019-11-16 DIAGNOSIS — R079 Chest pain, unspecified: Secondary | ICD-10-CM

## 2019-11-16 NOTE — Patient Instructions (Addendum)
Medication Instructions:  The current medical regimen is effective;  continue present plan and medications.  *If you need a refill on your cardiac medications before your next appointment, please call your pharmacy*  Lab: need covid testing  Testing/Procedures: Your physician has requested that you have a stress echocardiogram. For further information please visit [email or web address] Please follow instruction sheet as given.   Follow-Up: At [facility], you and your health needs are our priority.  As part of our continuing mission to provide you with exceptional heart care, we have created designated Provider Care Teams.  These Care Teams include your primary Cardiologist (physician) and Advanced Practice Providers (APPs -  Physician Assistants and Nurse Practitioners) who all work together to provide you with the care you need, when you need it.  We recommend signing up for the patient portal called "MyChart".  Sign up information is provided on this After Visit Summary.  MyChart is used to connect with patients for Virtual Visits (Telemedicine).  Patients are able to view lab/test results, encounter notes, upcoming appointments, etc.  Non-urgent messages can be sent to your provider as well.   To learn more about what you can do with MyChart, go to [email or web address].    Your next appointment:   2 month(s)  The format for your next appointment:   Virtual Visit   Provider:   Lennie Odor, MD

## 2019-11-27 DIAGNOSIS — F411 Generalized anxiety disorder: Secondary | ICD-10-CM | POA: Diagnosis not present

## 2019-12-12 ENCOUNTER — Other Ambulatory Visit (HOSPITAL_COMMUNITY)
Admission: RE | Admit: 2019-12-12 | Discharge: 2019-12-12 | Disposition: A | Discharge status: Home / Self Care | Payer: BC Managed Care – PPO | Source: Ambulatory Visit | Attending: Cardiovascular Disease | Admitting: Cardiovascular Disease

## 2019-12-12 ENCOUNTER — Telehealth (HOSPITAL_COMMUNITY): Payer: Self-pay

## 2019-12-12 DIAGNOSIS — Z20822 Contact with and (suspected) exposure to covid-19: Secondary | ICD-10-CM | POA: Insufficient documentation

## 2019-12-12 DIAGNOSIS — Z01812 Encounter for preprocedural laboratory examination: Secondary | ICD-10-CM | POA: Insufficient documentation

## 2019-12-12 NOTE — Telephone Encounter (Signed)
Spoke with the patient, she stated that she would be here for test. Asked to call back with any questions. S.Williams EMTP

## 2019-12-13 LAB — SARS CORONAVIRUS 2 (TAT 6-24 HRS): SARS Coronavirus 2: NEGATIVE

## 2019-12-14 ENCOUNTER — Ambulatory Visit (HOSPITAL_BASED_OUTPATIENT_CLINIC_OR_DEPARTMENT_OTHER): Payer: BC Managed Care – PPO

## 2019-12-14 ENCOUNTER — Ambulatory Visit (HOSPITAL_COMMUNITY): Payer: BC Managed Care – PPO | Attending: Cardiovascular Disease

## 2019-12-14 ENCOUNTER — Other Ambulatory Visit: Payer: Self-pay

## 2019-12-14 DIAGNOSIS — R079 Chest pain, unspecified: Secondary | ICD-10-CM | POA: Insufficient documentation

## 2019-12-14 MED ORDER — PERFLUTREN LIPID MICROSPHERE
1.0000 mL | INTRAVENOUS | Status: AC | PRN
  Administered 2019-12-14: 2 mL via INTRAVENOUS
  Administered 2019-12-14: 0.5 mL via INTRAVENOUS
  Administered 2019-12-14: 1 mL via INTRAVENOUS

## 2020-01-04 DIAGNOSIS — F411 Generalized anxiety disorder: Secondary | ICD-10-CM | POA: Diagnosis not present

## 2020-01-15 NOTE — Progress Notes (Signed)
Virtual Visit via Telephone Note   This visit type was conducted due to national recommendations for restrictions regarding the COVID-19 Pandemic (e.g. social distancing) in an effort to limit this patient's exposure and mitigate transmission in our community.  Due to her co-morbid illnesses, this patient is at least at moderate risk for complications without adequate follow up.  This format is felt to be most appropriate for this patient at this time.  The patient did not have access to video technology/had technical difficulties with video requiring transitioning to audio format only (telephone).  All issues noted in this document were discussed and addressed.  No physical exam could be performed with this format.  Please refer to the patient's chart for her  consent to telehealth for [facility].   The patient was identified using 2 identifiers.  Date:  01/16/2020   ID:  Michelle Villa, DOB Jan 14, 1986, MRN [id number]  Patient Location: Home Provider Location: Office  PCP:  Shirlean Mylar, MD  Cardiologist:  No primary care provider on file.   Evaluation Performed:  Follow-Up Visit  Chief Complaint:  Chest pain  History of Present Illness:    Michelle Villa is a 34 y.o. female with history of vascular thoracic outlet obstruction who presents for follow-up of chest pain. Stress echo was normal. Exercised 8 min 05 seconds (9.8 METS). Negative EKG changes for ischemia and negative echo for ischemia.   She reports she is still having episodes of dull and sharp chest pain.  She also reports she is having pain in the left arm.  These can occur at anytime of day.  She reports it is worse with inactivity.  She reports she does still walk and has no worsening pain when she walks.  She reports that the arm pain is also quite bothersome for her.  She has been evaluated by vascular surgery have not recommended any further surgery.  Apparently they did discuss bypass surgery for her given the  occlusions that she has in her left radial and ulnar arteries but she has declined.  She reports she is relieved that this is not a heart issue as her stress test was normal.  She is quite bothered that she still has the symptoms.  We did discuss that she may need to see neurology for potential nerve evaluation to determine if there is residual pain related to the initial damage she had from her vascular disorder.  Problem List 1. Vascular Thoracic Outlet syndrome -70% eccentric plaque in L subclavian -s/p removal of L first rib -S/p stenting of L subclavian -c/b embolic event to L upper extremity with occluded L radial artery and occluded L ulnar artery   The patient does not have symptoms concerning for COVID-19 infection (fever, chills, cough, or new shortness of breath).    Past Medical History:  Diagnosis Date  . Anxiety   . Depression   . Raynaud phenomenon   . Thyroid cyst    3 tumors on thyroid- all biopsied & benign    Past Surgical History:  Procedure Laterality Date  . AORTIC ARCH ANGIOGRAPHY N/A 09/11/2016   Procedure: Aortic Arch Angiography;  Surgeon: Sherren Kerns, MD;  Location: [facility] INVASIVE CV LAB;  Service: Cardiovascular;  Laterality: N/A;  . EMBOLECTOMY Left 09/28/2016   Procedure: Left Brachial, radial, ulnar and intraosseous artery thrombectomy;  Surgeon: Sherren Kerns, MD;  Location: [facility] OR;  Service: Vascular;  Laterality: Left;  . HYSTEROSCOPY N/A 12/28/2018   Procedure: HYSTEROSCOPY/D&C;  Surgeon: Richardson Dopp,  Delice Bison, MD;  Location: [facility] OR;  Service: Gynecology;  Laterality: N/A;  . I & D EXTREMITY Left 09/28/2016   Procedure: EVACUATION HEMATOMA NECK;  Surgeon: Sherren Kerns, MD;  Location: [facility] OR;  Service: Vascular;  Laterality: Left;  . LAPAROSCOPY Left 12/28/2018   Procedure: Operative Laparoscopy with left oophorectomy;  Surgeon: Gerald Leitz, MD;  Location: [facility] OR;  Service: Gynecology;  Laterality: Left;  . PATCH ANGIOPLASTY Left 09/28/2016   Procedure: PATCH  ANGIOPLASTY BRACHIAL ARTERY USING VIEN HARVESTED FROM PATIENT;  Surgeon: Sherren Kerns, MD;  Location: [facility] OR;  Service: Vascular;  Laterality: Left;  . PERIPHERAL VASCULAR INTERVENTION Left 09/28/2016   Procedure: LEFT SUBCLAVIAN STENT;  Surgeon: Sherren Kerns, MD;  Location: [facility] OR;  Service: Vascular;  Laterality: Left;  . RIB RESECTION Left 09/28/2016   Procedure: FIRST RIB RESECTION;  Surgeon: Sherren Kerns, MD;  Location: [facility] OR;  Service: Vascular;  Laterality: Left;  . UPPER EXTREMITY ANGIOGRAPHY N/A 09/11/2016   Procedure: Upper Extremity Angiography - Left;  Surgeon: Sherren Kerns, MD;  Location: [facility] INVASIVE CV LAB;  Service: Cardiovascular;  Laterality: N/A;  . UPPER EXTREMITY ANGIOGRAPHY Left 08/23/2019   Procedure: UPPER EXTREMITY ANGIOGRAPHY - Left;  Surgeon: Cephus Shelling, MD;  Location: [facility] INVASIVE CV LAB;  Service: Cardiovascular;  Laterality: Left;  . WISDOM TOOTH EXTRACTION       Current Meds  Medication Sig  . acetaminophen (TYLENOL) 325 MG tablet Take 650 mg by mouth every 6 (six) hours as needed for moderate pain or headache.   . ALPRAZolam (XANAX) 1 MG tablet Take 0.5 mg by mouth 2 (two) times daily as needed for anxiety.   . Cyanocobalamin (B-12 PO) Take 1 tablet by mouth daily.  Marland Kitchen escitalopram (LEXAPRO) 20 MG tablet Take 20 mg by mouth daily.  . Multiple Vitamin (MULTIVITAMIN) capsule Take 1 capsule by mouth daily.   . traMADol (ULTRAM) 50 MG tablet Take 50 mg by mouth daily as needed for moderate pain.   . [DISCONTINUED] calcium carbonate (TUMS - DOSED IN MG ELEMENTAL CALCIUM) 500 MG chewable tablet Chew 1-2 tablets by mouth daily as needed for indigestion or heartburn.     Allergies:   Other   Social History   Tobacco Use  . Smoking status: Never Smoker  . Smokeless tobacco: Never Used  Vaping Use  . Vaping Use: Never used  Substance Use Topics  . Alcohol use: Yes    Alcohol/week: 7.0 standard drinks    Types: 7 Standard drinks or equivalent  per week    Comment: 7 per week average  . Drug use: No     Family Hx: The patient's family history includes Hyperlipidemia in her father; Hypertension in her father. There is no history of Neuropathy.  ROS:   Please see the history of present illness.     All other systems reviewed and are negative.   Prior CV studies:   The following studies were reviewed today:  Stress Echo 12/14/2019 1. This is a negative stress echocardiogram for ischemia.  2. Above average functional capacity (8:05 min:s; 9.8 METS). Normal HR/BP  response to exercise.  3. This is a low risk study.   Labs/Other Tests and Data Reviewed:    EKG:  No ECG reviewed.  Recent Labs: 08/23/2019: BUN 11; Creatinine, Ser 0.60; Hemoglobin 13.3; Potassium 4.2; Sodium 136   Recent Lipid Panel No results found for: CHOL, TRIG, HDL, CHOLHDL, LDLCALC, LDLDIRECT  Wt Readings from Last 3 Encounters:  01/16/20 157 lb (71.2 kg)  11/16/19 162 lb 6.4 oz (73.7 kg)  09/07/19 165 lb (74.8 kg)     Objective:    Vital Signs:  Ht 5\' 3"  (1.6 m)   Wt 157 lb (71.2 kg)   BMI 27.81 kg/m    VITAL SIGNS:  reviewed  Gen: NAD Pulm: normal breathing by phone Psych: normal mood/affect  ASSESSMENT & PLAN:    1. Vascular thoracic outlet syndrome -Status post left subclavian stent.  Followed by vascular surgery.  Recent ultrasounds with normal blood flow.  She does have residual occlusions of the left radial and ulnar arteries.  Has declined bypass surgery.  She will continue to follow with Dr. .  2. Chest pain, unspecified type -Atypical chest pain.  Also associated with the arm pain she has which is described as chronic numbness.  She had a normal EKG and normal stress echocardiogram.  This is noncardiac chest pain.  Possibly this is musculoskeletal or neurologic related to her underlying vascular insult.  The subclavian artery stent is patent on most recent CT scan.  Recent ultrasound showed normal blood in the region of  the subclavian stent.  She does have chronic occlusions of the left radial and ulnar arteries in possibly this explains some of her symptoms.  I have asked her to reach back out to her primary care physician for further management.  She possibly needs neurological evaluation.   COVID-19 Education: The signs and symptoms of COVID-19 were discussed with the patient and how to seek care for testing (follow up with PCP or arrange E-visit).  The importance of social distancing was discussed today.  Time:  Today, I have spent 25 minutes with the patient with telehealth technology discussing the above problems.     Medication Adjustments/Labs and Tests Ordered: Current medicines are reviewed at length with the patient today.  Concerns regarding medicines are outlined above.   Tests Ordered: No orders of the defined types were placed in this encounter.   Medication Changes: No orders of the defined types were placed in this encounter.   Follow Up:  In Person prn  Signed, Darrick Penna, MD  01/16/2020 10:50 AM    [facility]

## 2020-01-16 ENCOUNTER — Encounter: Payer: Self-pay | Admitting: Cardiovascular Disease

## 2020-01-16 ENCOUNTER — Telehealth (INDEPENDENT_AMBULATORY_CARE_PROVIDER_SITE_OTHER): Payer: BC Managed Care – PPO | Admitting: Cardiovascular Disease

## 2020-01-16 VITALS — Ht 63.0 in | Wt 157.0 lb

## 2020-01-16 DIAGNOSIS — R079 Chest pain, unspecified: Secondary | ICD-10-CM | POA: Diagnosis not present

## 2020-01-16 DIAGNOSIS — G540 Brachial plexus disorders: Secondary | ICD-10-CM | POA: Diagnosis not present

## 2020-01-16 NOTE — Patient Instructions (Signed)
Medication Instructions:  The current medical regimen is effective;  continue present plan and medications.  *If you need a refill on your cardiac medications before your next appointment, please call your pharmacy*    Follow-Up: At [facility], you and your health needs are our priority.  As part of our continuing mission to provide you with exceptional heart care, we have created designated Provider Care Teams.  These Care Teams include your primary Cardiologist (physician) and Advanced Practice Providers (APPs -  Physician Assistants and Nurse Practitioners) who all work together to provide you with the care you need, when you need it.  We recommend signing up for the patient portal called "MyChart".  Sign up information is provided on this After Visit Summary.  MyChart is used to connect with patients for Virtual Visits (Telemedicine).  Patients are able to view lab/test results, encounter notes, upcoming appointments, etc.  Non-urgent messages can be sent to your provider as well.   To learn more about what you can do with MyChart, go to [email or web address].    Your next appointment:   As needed  The format for your next appointment:   In Person  Provider:   [place] Boro O'Neal, MD      

## 2020-09-27 ENCOUNTER — Emergency Department (HOSPITAL_COMMUNITY)
Admission: EM | Admit: 2020-09-27 | Discharge: 2020-09-27 | Disposition: A | Discharge status: Home / Self Care | Payer: 59 | Attending: Emergency Medicine | Admitting: Emergency Medicine

## 2020-09-27 ENCOUNTER — Encounter (HOSPITAL_COMMUNITY): Payer: Self-pay | Admitting: Emergency Medicine

## 2020-09-27 ENCOUNTER — Other Ambulatory Visit: Payer: Self-pay

## 2020-09-27 DIAGNOSIS — T7621XA Adult sexual abuse, suspected, initial encounter: Secondary | ICD-10-CM | POA: Diagnosis not present
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
## 2020-10-01 ENCOUNTER — Telehealth (HOSPITAL_COMMUNITY): Payer: Self-pay

## 2020-10-01 NOTE — Telephone Encounter (Signed)
Returned pt.s call .  She is looking for lab results from her ED visit on 09/27/2020.  I am unable to see lab results that she is requesting.Pt is upset she is unable to view labs.  I apologized to patient that I am  unable to locate results.  Patient would like ED leadership to contact her.  I will send her information to leadership.

## 2021-01-08 ENCOUNTER — Other Ambulatory Visit: Payer: Self-pay | Admitting: *Deleted

## 2021-01-08 DIAGNOSIS — M25529 Pain in unspecified elbow: Secondary | ICD-10-CM

## 2021-01-21 ENCOUNTER — Ambulatory Visit
Admission: RE | Admit: 2021-01-21 | Discharge: 2021-01-21 | Disposition: A | Discharge status: Home / Self Care | Payer: 59 | Source: Ambulatory Visit | Attending: Internal Medicine | Admitting: Internal Medicine

## 2021-01-21 ENCOUNTER — Other Ambulatory Visit: Payer: Self-pay | Admitting: Internal Medicine

## 2021-01-21 DIAGNOSIS — M25551 Pain in right hip: Secondary | ICD-10-CM

## 2021-01-21 DIAGNOSIS — M1711 Unilateral primary osteoarthritis, right knee: Secondary | ICD-10-CM

## 2021-01-23 ENCOUNTER — Ambulatory Visit (HOSPITAL_COMMUNITY)
Admission: RE | Admit: 2021-01-23 | Discharge: 2021-01-23 | Disposition: A | Discharge status: Home / Self Care | Payer: 59 | Source: Ambulatory Visit | Attending: Vascular Surgery | Admitting: Vascular Surgery

## 2021-01-23 ENCOUNTER — Other Ambulatory Visit: Payer: Self-pay

## 2021-01-23 ENCOUNTER — Ambulatory Visit (INDEPENDENT_AMBULATORY_CARE_PROVIDER_SITE_OTHER): Payer: 59 | Admitting: Vascular Surgery

## 2021-01-23 ENCOUNTER — Encounter: Payer: Self-pay | Admitting: Vascular Surgery

## 2021-01-23 VITALS — BP 133/91 | HR 111 | Temp 98.3°F | Ht 63.0 in | Wt 144.2 lb

## 2021-01-23 DIAGNOSIS — G540 Brachial plexus disorders: Secondary | ICD-10-CM

## 2021-01-23 DIAGNOSIS — M25529 Pain in unspecified elbow: Secondary | ICD-10-CM

## 2021-01-23 DIAGNOSIS — M25522 Pain in left elbow: Secondary | ICD-10-CM

## 2021-01-23 NOTE — Progress Notes (Signed)
Patient is a 35 year old female who returns for follow-up today.  She was last seen in February 2021.  She previously underwent left first rib resection and stenting of her left subclavian artery in March 2018.  This was done for vascular thoracic outlet syndrome and preoperative arterial embolization of her left hand.  When she was last seen a year ago she was still having some numbness and tingling and cold intolerance in her left hand.  We discussed whether or not to do a left brachial to radial artery bypass using upper arm cephalic vein.  At that point she deferred.  She currently has minimal symptoms.  She does have some coolness and aching in her hand intermittently and sometimes this can be exacerbated if she has a long day where she is doing a lot of heavy lifting.  Otherwise she feels okay.  She takes tramadol for the hand pain about once every 1 to 2 weeks.  Current Outpatient Medications on File Prior to Visit  Medication Sig Dispense Refill   amphetamine-dextroamphetamine (ADDERALL XR) 30 MG 24 hr capsule Take 30 mg by mouth daily.     acetaminophen (TYLENOL) 325 MG tablet Take 650 mg by mouth every 6 (six) hours as needed for moderate pain or headache.      ALPRAZolam (XANAX) 1 MG tablet Take 0.5 mg by mouth 2 (two) times daily as needed for anxiety.      Cyanocobalamin (B-12 PO) Take 1 tablet by mouth daily.     escitalopram (LEXAPRO) 20 MG tablet Take 20 mg by mouth daily.     Multiple Vitamin (MULTIVITAMIN) capsule Take 1 capsule by mouth daily.      traMADol (ULTRAM) 50 MG tablet Take 50 mg by mouth daily as needed for moderate pain.      No current facility-administered medications on file prior to visit.     Past Medical History:  Diagnosis Date   Anxiety    Depression    Raynaud phenomenon    Thyroid cyst    3 tumors on thyroid- all biopsied & benign     Past Surgical History:  Procedure Laterality Date   AORTIC ARCH ANGIOGRAPHY N/A 09/11/2016   Procedure: Aortic Arch  Angiography;  Surgeon: Sherren Kerns, MD;  Location: [facility] INVASIVE CV LAB;  Service: Cardiovascular;  Laterality: N/A;   EMBOLECTOMY Left 09/28/2016   Procedure: Left Brachial, radial, ulnar and intraosseous artery thrombectomy;  Surgeon: Sherren Kerns, MD;  Location: [facility] OR;  Service: Vascular;  Laterality: Left;   HYSTEROSCOPY N/A 12/28/2018   Procedure: HYSTEROSCOPY/D&C;  Surgeon: Gerald Leitz, MD;  Location: [facility] OR;  Service: Gynecology;  Laterality: N/A;   I & D EXTREMITY Left 09/28/2016   Procedure: EVACUATION HEMATOMA NECK;  Surgeon: Sherren Kerns, MD;  Location: [facility] OR;  Service: Vascular;  Laterality: Left;   LAPAROSCOPY Left 12/28/2018   Procedure: Operative Laparoscopy with left oophorectomy;  Surgeon: Gerald Leitz, MD;  Location: [facility] OR;  Service: Gynecology;  Laterality: Left;   PATCH ANGIOPLASTY Left 09/28/2016   Procedure: PATCH ANGIOPLASTY BRACHIAL ARTERY USING VIEN HARVESTED FROM PATIENT;  Surgeon: Sherren Kerns, MD;  Location: [facility] OR;  Service: Vascular;  Laterality: Left;   PERIPHERAL VASCULAR INTERVENTION Left 09/28/2016   Procedure: LEFT SUBCLAVIAN STENT;  Surgeon: Sherren Kerns, MD;  Location: [facility] OR;  Service: Vascular;  Laterality: Left;   RIB RESECTION Left 09/28/2016   Procedure: FIRST RIB RESECTION;  Surgeon: Sherren Kerns, MD;  Location: [facility] OR;  Service:  Vascular;  Laterality: Left;   UPPER EXTREMITY ANGIOGRAPHY N/A 09/11/2016   Procedure: Upper Extremity Angiography - Left;  Surgeon: Sherren Kerns, MD;  Location: [facility] INVASIVE CV LAB;  Service: Cardiovascular;  Laterality: N/A;   UPPER EXTREMITY ANGIOGRAPHY Left 08/23/2019   Procedure: UPPER EXTREMITY ANGIOGRAPHY - Left;  Surgeon: Cephus Shelling, MD;  Location: [facility] INVASIVE CV LAB;  Service: Cardiovascular;  Laterality: Left;   WISDOM TOOTH EXTRACTION      Physical exam:  Vitals:   01/23/21 1553  BP: (!) 133/91  Pulse: (!) 111  Temp: 98.3 F (36.8 C)  SpO2: 98%  Weight: 144 lb 3.2 oz (65.4 kg)  Height:  5\' 3"  (1.6 m)   Extremities: No palpable radial or ulnar pulse left hand skin is pink warm with brisk capillary refill  Data: Patient had a left upper extremity duplex exam today which showed a widely patent left subclavian stent with similar ulnar radial artery occlusion.  I reviewed her previous arteriogram from 1 year ago which showed primarily runoff by the interosseous artery which fills the distal radial artery at the level of the wrist.   Patient's previous upper extremity and lower extremity vein mapping showed right greater saphenous vein was 4 mm left greater saphenous vein 3 mm left upper arm cephalic vein 4 to 5 mm right cephalic vein 3 to 4 mm.  Forearm cephalic vein in the left arm was fairly small.  This was a vein that mapping done in 2021.  I reviewed those results today.  Assessment: Patent left subclavian artery stent.  Mild symptoms of compromised arterial circulation left upper extremity although seems to be improving over the last couple of years.  Plan: Patient will have a follow-up duplex ultrasound of her left upper extremity and left subclavian stent in 3 years.  She will follow-up sooner if she develops signs or symptoms of worsening pain in her hand or easier fatigability.  2022, MD [facility] Office: [phone]

## 2021-03-27 DIAGNOSIS — F411 Generalized anxiety disorder: Secondary | ICD-10-CM | POA: Diagnosis not present

## 2021-03-27 DIAGNOSIS — F331 Major depressive disorder, recurrent, moderate: Secondary | ICD-10-CM | POA: Diagnosis not present

## 2021-04-30 DIAGNOSIS — F411 Generalized anxiety disorder: Secondary | ICD-10-CM | POA: Diagnosis not present

## 2021-04-30 DIAGNOSIS — F331 Major depressive disorder, recurrent, moderate: Secondary | ICD-10-CM | POA: Diagnosis not present

## 2021-05-22 DIAGNOSIS — F331 Major depressive disorder, recurrent, moderate: Secondary | ICD-10-CM | POA: Diagnosis not present

## 2021-05-22 DIAGNOSIS — F411 Generalized anxiety disorder: Secondary | ICD-10-CM | POA: Diagnosis not present

## 2021-06-16 DIAGNOSIS — F331 Major depressive disorder, recurrent, moderate: Secondary | ICD-10-CM | POA: Diagnosis not present

## 2021-06-16 DIAGNOSIS — F411 Generalized anxiety disorder: Secondary | ICD-10-CM | POA: Diagnosis not present

## 2021-06-24 DIAGNOSIS — F411 Generalized anxiety disorder: Secondary | ICD-10-CM | POA: Diagnosis not present

## 2021-06-24 DIAGNOSIS — F331 Major depressive disorder, recurrent, moderate: Secondary | ICD-10-CM | POA: Diagnosis not present

## 2021-07-08 DIAGNOSIS — F411 Generalized anxiety disorder: Secondary | ICD-10-CM | POA: Diagnosis not present

## 2021-07-08 DIAGNOSIS — F331 Major depressive disorder, recurrent, moderate: Secondary | ICD-10-CM | POA: Diagnosis not present

## 2021-07-22 DIAGNOSIS — Z0000 Encounter for general adult medical examination without abnormal findings: Secondary | ICD-10-CM | POA: Diagnosis not present

## 2021-07-22 DIAGNOSIS — Z13220 Encounter for screening for lipoid disorders: Secondary | ICD-10-CM | POA: Diagnosis not present

## 2021-07-22 DIAGNOSIS — E039 Hypothyroidism, unspecified: Secondary | ICD-10-CM | POA: Diagnosis not present

## 2021-07-22 DIAGNOSIS — Z789 Other specified health status: Secondary | ICD-10-CM | POA: Diagnosis not present

## 2021-08-07 DIAGNOSIS — D0461 Carcinoma in situ of skin of right upper limb, including shoulder: Secondary | ICD-10-CM | POA: Diagnosis not present

## 2021-08-07 DIAGNOSIS — D485 Neoplasm of uncertain behavior of skin: Secondary | ICD-10-CM | POA: Diagnosis not present

## 2021-08-07 DIAGNOSIS — B078 Other viral warts: Secondary | ICD-10-CM | POA: Diagnosis not present

## 2021-08-21 DIAGNOSIS — B078 Other viral warts: Secondary | ICD-10-CM | POA: Diagnosis not present

## 2021-08-21 DIAGNOSIS — D0461 Carcinoma in situ of skin of right upper limb, including shoulder: Secondary | ICD-10-CM | POA: Diagnosis not present

## 2021-10-09 DIAGNOSIS — D0461 Carcinoma in situ of skin of right upper limb, including shoulder: Secondary | ICD-10-CM | POA: Diagnosis not present

## 2021-10-28 DIAGNOSIS — L578 Other skin changes due to chronic exposure to nonionizing radiation: Secondary | ICD-10-CM | POA: Diagnosis not present

## 2021-10-28 DIAGNOSIS — L814 Other melanin hyperpigmentation: Secondary | ICD-10-CM | POA: Diagnosis not present

## 2021-10-28 DIAGNOSIS — D1801 Hemangioma of skin and subcutaneous tissue: Secondary | ICD-10-CM | POA: Diagnosis not present

## 2021-10-28 DIAGNOSIS — L821 Other seborrheic keratosis: Secondary | ICD-10-CM | POA: Diagnosis not present

## 2021-10-28 DIAGNOSIS — B078 Other viral warts: Secondary | ICD-10-CM | POA: Diagnosis not present

## 2021-11-05 DIAGNOSIS — L905 Scar conditions and fibrosis of skin: Secondary | ICD-10-CM | POA: Diagnosis not present

## 2021-11-20 DIAGNOSIS — B078 Other viral warts: Secondary | ICD-10-CM | POA: Diagnosis not present

## 2021-12-24 DIAGNOSIS — E049 Nontoxic goiter, unspecified: Secondary | ICD-10-CM | POA: Diagnosis not present

## 2021-12-24 DIAGNOSIS — R232 Flushing: Secondary | ICD-10-CM | POA: Diagnosis not present

## 2021-12-24 DIAGNOSIS — R000 Tachycardia, unspecified: Secondary | ICD-10-CM | POA: Diagnosis not present

## 2021-12-24 DIAGNOSIS — R030 Elevated blood-pressure reading, without diagnosis of hypertension: Secondary | ICD-10-CM | POA: Diagnosis not present

## 2021-12-24 DIAGNOSIS — Z789 Other specified health status: Secondary | ICD-10-CM | POA: Diagnosis not present

## 2021-12-25 DIAGNOSIS — B078 Other viral warts: Secondary | ICD-10-CM | POA: Diagnosis not present

## 2021-12-29 DIAGNOSIS — R42 Dizziness and giddiness: Secondary | ICD-10-CM | POA: Diagnosis not present

## 2021-12-29 DIAGNOSIS — R000 Tachycardia, unspecified: Secondary | ICD-10-CM | POA: Diagnosis not present

## 2021-12-29 DIAGNOSIS — R030 Elevated blood-pressure reading, without diagnosis of hypertension: Secondary | ICD-10-CM | POA: Diagnosis not present

## 2022-01-01 ENCOUNTER — Telehealth: Payer: Self-pay | Admitting: Cardiovascular Disease

## 2022-01-01 NOTE — Telephone Encounter (Signed)
Called pt due to receiving a referral requesting she see Dr. Elease Hashimoto. Confirmed patient is wanting to switch due to her still being established with Dr. Flora Lipps from 2021. Patient states she is wanting to switch due to a friend with complex medical history seeing him and being very happy with his care. Please advise.

## 2022-01-07 DIAGNOSIS — R000 Tachycardia, unspecified: Secondary | ICD-10-CM | POA: Diagnosis not present

## 2022-01-13 ENCOUNTER — Encounter: Payer: Self-pay | Admitting: Cardiovascular Disease

## 2022-01-13 NOTE — Progress Notes (Unsigned)
Cardiology Office Note:    Date:  01/13/2022   ID:  Michelle Villa, DOB [birth year], MRN [id number]  PCP:  Shirlean Mylar, MD   [facility] Providers Cardiologist:  Luvenia Heller    Referring MD: Shirlean Mylar, MD   Chief Complaint  Patient presents with   Palpitations  ***  History of Present Illness:    Michelle Villa is a 36 y.o. female with a hx of anxiety, depression . She is the Print production planner at [employer]       Past Medical History:  Diagnosis Date   Anxiety    Depression    Raynaud phenomenon    Thyroid cyst    3 tumors on thyroid- all biopsied & benign     Past Surgical History:  Procedure Laterality Date   AORTIC ARCH ANGIOGRAPHY N/A 09/11/2016   Procedure: Aortic Arch Angiography;  Surgeon: Sherren Kerns, MD;  Location: [facility] INVASIVE CV LAB;  Service: Cardiovascular;  Laterality: N/A;   EMBOLECTOMY Left 09/28/2016   Procedure: Left Brachial, radial, ulnar and intraosseous artery thrombectomy;  Surgeon: Sherren Kerns, MD;  Location: [facility] OR;  Service: Vascular;  Laterality: Left;   HYSTEROSCOPY N/A 12/28/2018   Procedure: HYSTEROSCOPY/D&C;  Surgeon: Gerald Leitz, MD;  Location: [facility] OR;  Service: Gynecology;  Laterality: N/A;   I & D EXTREMITY Left 09/28/2016   Procedure: EVACUATION HEMATOMA NECK;  Surgeon: Sherren Kerns, MD;  Location: [facility] OR;  Service: Vascular;  Laterality: Left;   LAPAROSCOPY Left 12/28/2018   Procedure: Operative Laparoscopy with left oophorectomy;  Surgeon: Gerald Leitz, MD;  Location: [facility] OR;  Service: Gynecology;  Laterality: Left;   PATCH ANGIOPLASTY Left 09/28/2016   Procedure: PATCH ANGIOPLASTY BRACHIAL ARTERY USING VIEN HARVESTED FROM PATIENT;  Surgeon: Sherren Kerns, MD;  Location: [facility] OR;  Service: Vascular;  Laterality: Left;   PERIPHERAL VASCULAR INTERVENTION Left 09/28/2016   Procedure: LEFT SUBCLAVIAN STENT;  Surgeon: Sherren Kerns, MD;  Location: [facility] OR;  Service: Vascular;  Laterality: Left;   RIB  RESECTION Left 09/28/2016   Procedure: FIRST RIB RESECTION;  Surgeon: Sherren Kerns, MD;  Location: [facility] OR;  Service: Vascular;  Laterality: Left;   UPPER EXTREMITY ANGIOGRAPHY N/A 09/11/2016   Procedure: Upper Extremity Angiography - Left;  Surgeon: Sherren Kerns, MD;  Location: [facility] INVASIVE CV LAB;  Service: Cardiovascular;  Laterality: N/A;   UPPER EXTREMITY ANGIOGRAPHY Left 08/23/2019   Procedure: UPPER EXTREMITY ANGIOGRAPHY - Left;  Surgeon: Cephus Shelling, MD;  Location: [facility] INVASIVE CV LAB;  Service: Cardiovascular;  Laterality: Left;   WISDOM TOOTH EXTRACTION      Current Medications: No outpatient medications have been marked as taking for the 01/14/22 encounter (Office Visit) with Gelsey Amyx, Deloris Ping, MD.     Allergies:   Other   Social History   Socioeconomic History   Marital status: Married    Spouse name: Not on file   Number of children: Not on file   Years of education: Not on file   Highest education level: Not on file  Occupational History   Occupation: waitress  Tobacco Use   Smoking status: Never   Smokeless tobacco: Never  Vaping Use   Vaping Use: Never used  Substance and Sexual Activity   Alcohol use: Yes    Alcohol/week: 7.0 standard drinks of alcohol    Types: 7 Standard drinks or equivalent per week    Comment: 7 per week average   Drug use: No  Sexual activity: Not on file  Other Topics Concern   Not on file  Social History Narrative   Not on file   Social Determinants of Health   Financial Resource Strain: Not on file  Food Insecurity: Not on file  Transportation Needs: Not on file  Physical Activity: Not on file  Stress: Not on file  Social Connections: Not on file     Family History: The patient's ***family history includes Hyperlipidemia in her father; Hypertension in her father. There is no history of Neuropathy.  ROS:   Please see the history of present illness.    *** All other systems reviewed and are  negative.  EKGs/Labs/Other Studies Reviewed:    The following studies were reviewed today: ***  EKG:  EKG is *** ordered today.  The ekg ordered today demonstrates ***  Recent Labs: No results found for requested labs within last 365 days.  Recent Lipid Panel No results found for: "CHOL", "TRIG", "HDL", "CHOLHDL", "VLDL", "LDLCALC", "LDLDIRECT"   Risk Assessment/Calculations:   {Does this patient have ATRIAL FIBRILLATION?:[phone]}       Physical Exam:    VS:  There were no vitals taken for this visit.    Wt Readings from Last 3 Encounters:  01/23/21 144 lb 3.2 oz (65.4 kg)  01/16/20 157 lb (71.2 kg)  11/16/19 162 lb 6.4 oz (73.7 kg)     GEN: *** Well nourished, well developed in no acute distress HEENT: Normal NECK: No JVD; No carotid bruits LYMPHATICS: No lymphadenopathy CARDIAC: ***RRR, no murmurs, rubs, gallops RESPIRATORY:  Clear to auscultation without rales, wheezing or rhonchi  ABDOMEN: Soft, non-tender, non-distended MUSCULOSKELETAL:  No edema; No deformity  SKIN: Warm and dry NEUROLOGIC:  Alert and oriented x 3 PSYCHIATRIC:  Normal affect   ASSESSMENT:    No diagnosis found. PLAN:    In order of problems listed above:  ***      {Are you ordering a CV Procedure (e.g. stress test, cath, DCCV, TEE, etc)?   Press F2        :[id number]}    Medication Adjustments/Labs and Tests Ordered: Current medicines are reviewed at length with the patient today.  Concerns regarding medicines are outlined above.  No orders of the defined types were placed in this encounter.  No orders of the defined types were placed in this encounter.   There are no Patient Instructions on file for this visit.   Signed, Kristeen Miss, MD  01/13/2022 8:04 PM    [facility]

## 2022-01-14 ENCOUNTER — Encounter: Payer: 59 | Admitting: Cardiovascular Disease

## 2022-01-14 DIAGNOSIS — R000 Tachycardia, unspecified: Secondary | ICD-10-CM | POA: Diagnosis not present

## 2022-01-18 NOTE — Progress Notes (Unsigned)
Cardiology Office Note:    Date:  01/18/2022   ID:  Michelle Villa, DOB Apr 26, 1986, MRN [id number]  PCP:  Elease Hashimoto Deloris Ping, MD   [facility] Providers Cardiologist:  None { Click to update primary MD,subspecialty MD or APP then REFRESH:1}    Referring MD: Shirlean Mylar, MD   No chief complaint on file. ***  History of Present Illness:    Michelle Villa is a 36 y.o. female with a hx of anxiety, depression . She is the Print production planner at [employer]   She has a hx of arterial thoracic outlet syndrome.  She has had removal of her 1st rib and has had stenting of her L subclavian artery .   Hx of arterial embolization to her left hand   Arteriogram by Dr. Chestine Spore showed an occluded left radial artery and occluded ulnar artery . She had multiple collaterals at the wrist   She has had stenting of hte L subclavian artery   She was seen by Dr. Bufford Buttner in May, 2021 for episodes of chest pain.  These pains were described as sharp sharp and dull.  They lasted for about 3 to 4 minutes.     Past Medical History:  Diagnosis Date   Anxiety    Depression    Raynaud phenomenon    Thyroid cyst    3 tumors on thyroid- all biopsied & benign     Past Surgical History:  Procedure Laterality Date   AORTIC ARCH ANGIOGRAPHY N/A 09/11/2016   Procedure: Aortic Arch Angiography;  Surgeon: Sherren Kerns, MD;  Location: [facility] INVASIVE CV LAB;  Service: Cardiovascular;  Laterality: N/A;   EMBOLECTOMY Left 09/28/2016   Procedure: Left Brachial, radial, ulnar and intraosseous artery thrombectomy;  Surgeon: Sherren Kerns, MD;  Location: [facility] OR;  Service: Vascular;  Laterality: Left;   HYSTEROSCOPY N/A 12/28/2018   Procedure: HYSTEROSCOPY/D&C;  Surgeon: Gerald Leitz, MD;  Location: [facility] OR;  Service: Gynecology;  Laterality: N/A;   I & D EXTREMITY Left 09/28/2016   Procedure: EVACUATION HEMATOMA NECK;  Surgeon: Sherren Kerns, MD;  Location: [facility] OR;  Service: Vascular;  Laterality:  Left;   LAPAROSCOPY Left 12/28/2018   Procedure: Operative Laparoscopy with left oophorectomy;  Surgeon: Gerald Leitz, MD;  Location: [facility] OR;  Service: Gynecology;  Laterality: Left;   PATCH ANGIOPLASTY Left 09/28/2016   Procedure: PATCH ANGIOPLASTY BRACHIAL ARTERY USING VIEN HARVESTED FROM PATIENT;  Surgeon: Sherren Kerns, MD;  Location: [facility] OR;  Service: Vascular;  Laterality: Left;   PERIPHERAL VASCULAR INTERVENTION Left 09/28/2016   Procedure: LEFT SUBCLAVIAN STENT;  Surgeon: Sherren Kerns, MD;  Location: [facility] OR;  Service: Vascular;  Laterality: Left;   RIB RESECTION Left 09/28/2016   Procedure: FIRST RIB RESECTION;  Surgeon: Sherren Kerns, MD;  Location: [facility] OR;  Service: Vascular;  Laterality: Left;   UPPER EXTREMITY ANGIOGRAPHY N/A 09/11/2016   Procedure: Upper Extremity Angiography - Left;  Surgeon: Sherren Kerns, MD;  Location: [facility] INVASIVE CV LAB;  Service: Cardiovascular;  Laterality: N/A;   UPPER EXTREMITY ANGIOGRAPHY Left 08/23/2019   Procedure: UPPER EXTREMITY ANGIOGRAPHY - Left;  Surgeon: Cephus Shelling, MD;  Location: [facility] INVASIVE CV LAB;  Service: Cardiovascular;  Laterality: Left;   WISDOM TOOTH EXTRACTION      Current Medications: No outpatient medications have been marked as taking for the 01/19/22 encounter (Appointment) with Patirica Longshore, Deloris Ping, MD.     Allergies:   Other   Social History  Socioeconomic History   Marital status: Married    Spouse name: Not on file   Number of children: Not on file   Years of education: Not on file   Highest education level: Not on file  Occupational History   Occupation: waitress  Tobacco Use   Smoking status: Never   Smokeless tobacco: Never  Vaping Use   Vaping Use: Never used  Substance and Sexual Activity   Alcohol use: Yes    Alcohol/week: 7.0 standard drinks of alcohol    Types: 7 Standard drinks or equivalent per week    Comment: 7 per week average   Drug use: No   Sexual activity: Not on file  Other Topics  Concern   Not on file  Social History Narrative   Not on file   Social Determinants of Health   Financial Resource Strain: Not on file  Food Insecurity: Not on file  Transportation Needs: Not on file  Physical Activity: Not on file  Stress: Not on file  Social Connections: Not on file     Family History: The patient's ***family history includes Hyperlipidemia in her father; Hypertension in her father. There is no history of Neuropathy.  ROS:   Please see the history of present illness.    *** All other systems reviewed and are negative.  EKGs/Labs/Other Studies Reviewed:    The following studies were reviewed today: ***  EKG:  EKG is *** ordered today.  The ekg ordered today demonstrates ***  Recent Labs: No results found for requested labs within last 365 days.  Recent Lipid Panel No results found for: "CHOL", "TRIG", "HDL", "CHOLHDL", "VLDL", "LDLCALC", "LDLDIRECT"   Risk Assessment/Calculations:   {Does this patient have ATRIAL FIBRILLATION?:[id number]}       Physical Exam:    VS:  There were no vitals taken for this visit.    Wt Readings from Last 3 Encounters:  01/23/21 144 lb 3.2 oz (65.4 kg)  01/16/20 157 lb (71.2 kg)  11/16/19 162 lb 6.4 oz (73.7 kg)     GEN: *** Well nourished, well developed in no acute distress HEENT: Normal NECK: No JVD; No carotid bruits LYMPHATICS: No lymphadenopathy CARDIAC: ***RRR, no murmurs, rubs, gallops RESPIRATORY:  Clear to auscultation without rales, wheezing or rhonchi  ABDOMEN: Soft, non-tender, non-distended MUSCULOSKELETAL:  No edema; No deformity  SKIN: Warm and dry NEUROLOGIC:  Alert and oriented x 3 PSYCHIATRIC:  Normal affect   ASSESSMENT:    No diagnosis found. PLAN:    In order of problems listed above:  ***      {Are you ordering a CV Procedure (e.g. stress test, cath, DCCV, TEE, etc)?   Press F2        :[id number]}    Medication Adjustments/Labs and Tests Ordered: Current medicines are  reviewed at length with the patient today.  Concerns regarding medicines are outlined above.  No orders of the defined types were placed in this encounter.  No orders of the defined types were placed in this encounter.   There are no Patient Instructions on file for this visit.   Signed, Kristeen Miss, MD  01/18/2022 6:59 PM    [facility]

## 2022-01-19 ENCOUNTER — Ambulatory Visit: Payer: BC Managed Care – PPO | Admitting: Cardiovascular Disease

## 2022-01-19 ENCOUNTER — Encounter: Payer: Self-pay | Admitting: Cardiovascular Disease

## 2022-01-19 VITALS — BP 130/98 | HR 86 | Ht 63.0 in | Wt 146.8 lb

## 2022-01-19 DIAGNOSIS — I10 Essential (primary) hypertension: Secondary | ICD-10-CM

## 2022-01-19 DIAGNOSIS — G540 Brachial plexus disorders: Secondary | ICD-10-CM

## 2022-01-19 DIAGNOSIS — R6889 Other general symptoms and signs: Secondary | ICD-10-CM | POA: Diagnosis not present

## 2022-01-19 MED ORDER — METOPROLOL SUCCINATE ER 25 MG PO TB24: 25.0000 mg | ORAL_TABLET | Freq: Every day | ORAL | 3 refills | Status: DC

## 2022-01-19 NOTE — Patient Instructions (Signed)
Medication Instructions:  START Toprol XL (metoprolol succinate) 25mg  daily *If you need a refill on your cardiac medications before your next appointment, please call your pharmacy*   Lab Work: NONE If you have labs (blood work) drawn today and your tests are completely normal, you will receive your results only by: MyChart Message (if you have MyChart) OR A paper copy in the mail If you have any lab test that is abnormal or we need to change your treatment, we will call you to review the results.   Testing/Procedures: NONE   Follow-Up: At [facility], you and your health needs are our priority.  As part of our continuing mission to provide you with exceptional heart care, we have created designated Provider Care Teams.  These Care Teams include your primary Cardiologist (physician) and Advanced Practice Providers (APPs -  Physician Assistants and Nurse Practitioners) who all work together to provide you with the care you need, when you need it.  Your next appointment:   2 month(s)  The format for your next appointment:   In Person  Provider:   [facility], MD    Important Information About Sugar

## 2022-01-20 DIAGNOSIS — F902 Attention-deficit hyperactivity disorder, combined type: Secondary | ICD-10-CM | POA: Diagnosis not present

## 2022-01-20 DIAGNOSIS — R000 Tachycardia, unspecified: Secondary | ICD-10-CM | POA: Diagnosis not present

## 2022-01-23 DIAGNOSIS — R000 Tachycardia, unspecified: Secondary | ICD-10-CM | POA: Diagnosis not present

## 2022-01-28 DIAGNOSIS — F331 Major depressive disorder, recurrent, moderate: Secondary | ICD-10-CM | POA: Diagnosis not present

## 2022-01-28 DIAGNOSIS — F411 Generalized anxiety disorder: Secondary | ICD-10-CM | POA: Diagnosis not present

## 2022-02-18 DIAGNOSIS — F411 Generalized anxiety disorder: Secondary | ICD-10-CM | POA: Diagnosis not present

## 2022-02-18 DIAGNOSIS — F331 Major depressive disorder, recurrent, moderate: Secondary | ICD-10-CM | POA: Diagnosis not present

## 2022-02-26 DIAGNOSIS — F411 Generalized anxiety disorder: Secondary | ICD-10-CM | POA: Diagnosis not present

## 2022-02-26 DIAGNOSIS — F331 Major depressive disorder, recurrent, moderate: Secondary | ICD-10-CM | POA: Diagnosis not present

## 2022-03-05 DIAGNOSIS — F411 Generalized anxiety disorder: Secondary | ICD-10-CM | POA: Diagnosis not present

## 2022-03-05 DIAGNOSIS — F331 Major depressive disorder, recurrent, moderate: Secondary | ICD-10-CM | POA: Diagnosis not present

## 2022-03-14 DIAGNOSIS — Z4802 Encounter for removal of sutures: Secondary | ICD-10-CM | POA: Diagnosis not present

## 2022-03-19 DIAGNOSIS — F331 Major depressive disorder, recurrent, moderate: Secondary | ICD-10-CM | POA: Diagnosis not present

## 2022-03-19 DIAGNOSIS — F411 Generalized anxiety disorder: Secondary | ICD-10-CM | POA: Diagnosis not present

## 2022-03-22 ENCOUNTER — Encounter: Payer: Self-pay | Admitting: Cardiovascular Disease

## 2022-03-22 NOTE — Progress Notes (Unsigned)
Cardiology Office Note:    Date:  03/24/2022   ID:  Michelle Villa, DOB 12/18/1985, MRN [id number]  PCP:  Collene Mares, PA   [facility] Providers Cardiologist:  Asiah Browder   }    Referring MD: Elease Hashimoto Deloris Ping, MD   Chief Complaint  Patient presents with   Hypertension          History of Present Illness:  01/19/22   Michelle Villa is a 36 y.o. female with a hx of anxiety, depression . 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   Was diagnosed in 2018.   She has had removal of her 1st rib and has had stenting of her L subclavian artery .   Hx of arterial embolization to her left hand   Arteriogram by Dr. Chestine Spore showed an occluded left radial artery and occluded ulnar artery . She had multiple collaterals at the wrist  Had significant embolectomy of her left forearm  L radial artery remains occluded The L ulnar artery is patent   Still has some tingling that come and goes.  She is able to improve the tingling in her L arm by moving it to a different position .    She has had stenting of hte L subclavian artery   She was seen by Dr. Bufford Buttner in May, 2021 for episodes of chest pain.  These pains were described as sharp sharp and dull.  They lasted for about 3 to 4 minutes.  Her primary doctor, Dr. Hyman Hopes was concerned about the arterial emboli in her left arm.  They were questioning whether or not [name] should go back on birth control pills because of this clotting. I reviewed the hematology notes,   I seen no mention of hypercoagulable state.    Was off birth control for years. Had severe periodds Eventually had [facility] and oopherectomy   In Oct. 2020 , had covid Developed CP   Had a negative stress echo in June, 2021   Current issues:  Started having hot flashes,   The hot flashes come in waves  Started propranolol 20 mg at night which helped the heat waves slightly, but make her  fuzzy .   Sees Evangeline Dakin  ( PCP)   TSH was normal in June, 2023  Frerquent headaches  + family hx of HTN  Likes salty foods.  But does not eat much salt Eats some processed vegan foods.    Sept. 11, 2023 Michelle Villa is seen for follow up of her palpitations, anxiety, The Toprol XL seems to be helping  She forgot it 1 day and felt much worse that day  Reviewed labs from her primary medical doctor. Her CBC was largely normal.  Hemoglobin is 14.1. TSH was 0.53. Complete metabolic profile has been largely normal.  Occasionally her glucose levels run a little low.   Past Medical History:  Diagnosis Date   Anxiety    Depression    Raynaud phenomenon    Thyroid cyst    3 tumors on thyroid- all biopsied & benign     Past Surgical History:  Procedure Laterality Date   AORTIC ARCH ANGIOGRAPHY N/A 09/11/2016   Procedure: Aortic Arch Angiography;  Surgeon: Sherren Kerns, MD;  Location: [facility] INVASIVE CV LAB;  Service: Cardiovascular;  Laterality: N/A;   EMBOLECTOMY Left 09/28/2016   Procedure: Left Brachial, radial, ulnar and intraosseous artery thrombectomy;  Surgeon: Sherren Kerns,  MD;  Location: [place];  Service: Vascular;  Laterality: Left;   HYSTEROSCOPY N/A 12/28/2018   Procedure: HYSTEROSCOPY/D&C;  Surgeon: Christophe Louis, MD;  Location: [place];  Service: Gynecology;  Laterality: N/A;   I & D EXTREMITY Left 09/28/2016   Procedure: EVACUATION HEMATOMA NECK;  Surgeon: Elam Dutch, MD;  Location: [place];  Service: Vascular;  Laterality: Left;   LAPAROSCOPY Left 12/28/2018   Procedure: Operative Laparoscopy with left oophorectomy;  Surgeon: Christophe Louis, MD;  Location: [place];  Service: Gynecology;  Laterality: Left;   PATCH ANGIOPLASTY Left 09/28/2016   Procedure: PATCH ANGIOPLASTY BRACHIAL ARTERY USING VIEN HARVESTED FROM PATIENT;  Surgeon: Elam Dutch, MD;  Location: [place];  Service: Vascular;  Laterality: Left;   PERIPHERAL VASCULAR INTERVENTION Left 09/28/2016   Procedure: LEFT  SUBCLAVIAN STENT;  Surgeon: Elam Dutch, MD;  Location: [place];  Service: Vascular;  Laterality: Left;   RIB RESECTION Left 09/28/2016   Procedure: FIRST RIB RESECTION;  Surgeon: Elam Dutch, MD;  Location: [facility] OR;  Service: Vascular;  Laterality: Left;   UPPER EXTREMITY ANGIOGRAPHY N/A 09/11/2016   Procedure: Upper Extremity Angiography - Left;  Surgeon: Elam Dutch, MD;  Location: [place] CV LAB;  Service: Cardiovascular;  Laterality: N/A;   UPPER EXTREMITY ANGIOGRAPHY Left 08/23/2019   Procedure: UPPER EXTREMITY ANGIOGRAPHY - Left;  Surgeon: Marty Heck, MD;  Location: [place] CV LAB;  Service: Cardiovascular;  Laterality: Left;   WISDOM TOOTH EXTRACTION      Current Medications: Current Meds  Medication Sig   acetaminophen (TYLENOL) 325 MG tablet Take 650 mg by mouth every 6 (six) hours as needed for moderate pain or headache.    ALPRAZolam (XANAX) 1 MG tablet Take 0.5 mg by mouth 2 (two) times daily as needed for anxiety.    amphetamine-dextroamphetamine (ADDERALL XR) 30 MG 24 hr capsule Take 30 mg by mouth daily.   BIOTIN PO Take 1 tablet by mouth daily. 5000 mcg   Cyanocobalamin (B-12 PO) Take 1 tablet by mouth daily.   meclizine (ANTIVERT) 25 MG tablet Take 25 mg by mouth 2 (two) times daily as needed.   meloxicam (MOBIC) 15 MG tablet Take 1 tablet by mouth daily as needed.   metoprolol succinate (TOPROL-XL) 50 MG 24 hr tablet Take 1 tablet (50 mg total) by mouth daily. Take with or immediately following a meal.   Multiple Vitamin (MULTIVITAMIN) capsule Take 1 capsule by mouth daily.    propranolol (INDERAL) 20 MG tablet Take 1 tablet by mouth at bedtime.   traMADol (ULTRAM) 50 MG tablet Take 50 mg by mouth daily as needed for moderate pain.      Allergies:   Other   Social History   Socioeconomic History   Marital status: Married    Spouse name: Not on file   Number of children: Not on file   Years of education: Not on file   Highest education  level: Not on file  Occupational History   Occupation: waitress  Tobacco Use   Smoking status: Never   Smokeless tobacco: Never  Vaping Use   Vaping Use: Never used  Substance and Sexual Activity   Alcohol use: Yes    Alcohol/week: 7.0 standard drinks of alcohol    Types: 7 Standard drinks or equivalent per week    Comment: 7 per week average   Drug use: No   Sexual activity: Not on file  Other Topics Concern   Not on file  Social History Narrative   Not on file   Social Determinants of Health   Financial Resource Strain: Not on file  Food Insecurity: Not on file  Transportation Needs: Not on file  Physical Activity: Not on file  Stress: Not on file  Social Connections: Not on file     Family History: The patient's family history includes Hyperlipidemia in her father; Hypertension in her father. There is no history of Neuropathy.  ROS:   Please see the history of present illness.     All other systems reviewed and are negative.  EKGs/Labs/Other Studies Reviewed:    The following studies were reviewed today:   EKG:  Recent Labs: No results found for requested labs within last 365 days.  Recent Lipid Panel No results found for: "CHOL", "TRIG", "HDL", "CHOLHDL", "VLDL", "LDLCALC", "LDLDIRECT"   Risk Assessment/Calculations:           Physical Exam:    Physical Exam: Blood pressure 132/80, pulse 90, height 5\' 3"  (1.6 m), weight 144 lb 6.4 oz (65.5 kg), SpO2 98 %.       GEN:  Well nourished, well developed in no acute distress HEENT: Normal NECK: No JVD; No carotid bruits LYMPHATICS: No lymphadenopathy CARDIAC: RRR , no murmurs,  she has several ribs along her left sternal border that at moderately sensitive.  ( ? Due to costohcrondritis )  RESPIRATORY:  Clear to auscultation without rales, wheezing or rhonchi  ABDOMEN: Soft, non-tender, non-distended MUSCULOSKELETAL:  No edema; No deformity  SKIN: Warm and dry NEUROLOGIC:  Alert and oriented x  3   ASSESSMENT:    No diagnosis found.  PLAN:      Hypertension:   Palpitations: She clearly is better on Toprol-XL 25 mg a day her heart rate is still a little bit high.  We will try increasing the metoprolol XL to 50 mg today.  She will call us back if this makes her feel better than worse.  Overall I am pleased with her progress.  2.  Costochondritis: She has some tenderness along left sternal border.  I suggest that this is probably costochondritis.  I do not think that it is a cardiac issue.  3.  Thoracic outlet syndrome: She has a history of thoracic outlet syndrome with compromised circulation to her left arm.  She notes that her left arm is occasionally cooler to touch.  Her left radial pulses are not as brisk as her right radial pulse. She does not have any specific symptoms at rest.  She may need to return to see one of the surgeons at vein and vascular surgery   See her again in 3 months.   Medication Adjustments/Labs and Tests Ordered: Current medicines are reviewed at length with the patient today.  Concerns regarding medicines are outlined above.  No orders of the defined types were placed in this encounter.  Meds ordered this encounter  Medications   metoprolol succinate (TOPROL-XL) 50 MG 24 hr tablet    Sig: Take 1 tablet (50 mg total) by mouth daily. Take with or immediately following a meal.    Dispense:  90 tablet    Refill:  3      Patient Instructions  Medication Instructions:  Your physician has recommended you make the following change in your medication:   Toprol XL increased to 50mg  daily *If you need a refill on your cardiac medications before your next appointment, please call your pharmacy*  Follow-Up: At [facility], you and your health  needs are our priority.  As part of our continuing mission to provide you with exceptional heart care, we have created designated Provider Care Teams.  These Care Teams include your primary  Cardiologist (physician) and Advanced Practice Providers (APPs -  Physician Assistants and Nurse Practitioners) who all work together to provide you with the care you need, when you need it.  Your next appointment:   3 month(s)  The format for your next appointment:   In Person  Provider:   Dr. Elease Hashimoto           Signed, Kristeen Miss, MD  03/24/2022 7:58 AM    [facility]

## 2022-03-23 ENCOUNTER — Ambulatory Visit: Payer: BC Managed Care – PPO | Attending: Cardiovascular Disease | Admitting: Cardiovascular Disease

## 2022-03-23 ENCOUNTER — Encounter: Payer: Self-pay | Admitting: Cardiovascular Disease

## 2022-03-23 DIAGNOSIS — R002 Palpitations: Secondary | ICD-10-CM | POA: Diagnosis not present

## 2022-03-23 MED ORDER — METOPROLOL SUCCINATE ER 50 MG PO TB24: 50.0000 mg | ORAL_TABLET | Freq: Every day | ORAL | 3 refills | Status: DC

## 2022-03-23 NOTE — Patient Instructions (Signed)
Medication Instructions:  Your physician has recommended you make the following change in your medication:   Toprol XL increased to 50mg  daily *If you need a refill on your cardiac medications before your next appointment, please call your pharmacy*  Follow-Up: At [facility], you and your health needs are our priority.  As part of our continuing mission to provide you with exceptional heart care, we have created designated Provider Care Teams.  These Care Teams include your primary Cardiologist (physician) and Advanced Practice Providers (APPs -  Physician Assistants and Nurse Practitioners) who all work together to provide you with the care you need, when you need it.  Your next appointment:   3 month(s)  The format for your next appointment:   In Person  Provider:   Dr. [facility]

## 2022-03-24 DIAGNOSIS — R002 Palpitations: Secondary | ICD-10-CM | POA: Insufficient documentation

## 2022-03-31 DIAGNOSIS — F331 Major depressive disorder, recurrent, moderate: Secondary | ICD-10-CM | POA: Diagnosis not present

## 2022-03-31 DIAGNOSIS — F411 Generalized anxiety disorder: Secondary | ICD-10-CM | POA: Diagnosis not present

## 2022-04-09 DIAGNOSIS — F331 Major depressive disorder, recurrent, moderate: Secondary | ICD-10-CM | POA: Diagnosis not present

## 2022-04-09 DIAGNOSIS — F411 Generalized anxiety disorder: Secondary | ICD-10-CM | POA: Diagnosis not present

## 2022-04-15 DIAGNOSIS — F331 Major depressive disorder, recurrent, moderate: Secondary | ICD-10-CM | POA: Diagnosis not present

## 2022-04-15 DIAGNOSIS — F411 Generalized anxiety disorder: Secondary | ICD-10-CM | POA: Diagnosis not present

## 2022-04-23 DIAGNOSIS — F902 Attention-deficit hyperactivity disorder, combined type: Secondary | ICD-10-CM | POA: Diagnosis not present

## 2022-04-23 DIAGNOSIS — G894 Chronic pain syndrome: Secondary | ICD-10-CM | POA: Diagnosis not present

## 2022-04-23 DIAGNOSIS — Z23 Encounter for immunization: Secondary | ICD-10-CM | POA: Diagnosis not present

## 2022-04-23 DIAGNOSIS — R000 Tachycardia, unspecified: Secondary | ICD-10-CM | POA: Diagnosis not present

## 2022-04-23 DIAGNOSIS — G540 Brachial plexus disorders: Secondary | ICD-10-CM | POA: Diagnosis not present

## 2022-05-06 DIAGNOSIS — F411 Generalized anxiety disorder: Secondary | ICD-10-CM | POA: Diagnosis not present

## 2022-05-06 DIAGNOSIS — F331 Major depressive disorder, recurrent, moderate: Secondary | ICD-10-CM | POA: Diagnosis not present

## 2022-05-21 DIAGNOSIS — F411 Generalized anxiety disorder: Secondary | ICD-10-CM | POA: Diagnosis not present

## 2022-05-21 DIAGNOSIS — F331 Major depressive disorder, recurrent, moderate: Secondary | ICD-10-CM | POA: Diagnosis not present

## 2022-06-11 DIAGNOSIS — Z13818 Encounter for screening for other digestive system disorders: Secondary | ICD-10-CM | POA: Diagnosis not present

## 2022-06-11 DIAGNOSIS — J069 Acute upper respiratory infection, unspecified: Secondary | ICD-10-CM | POA: Diagnosis not present

## 2022-06-22 ENCOUNTER — Encounter: Payer: Self-pay | Admitting: Cardiovascular Disease

## 2022-06-22 NOTE — Progress Notes (Signed)
Cardiology Office Note:    Date:  07/08/2022   ID:  Michelle Villa, DOB [birth year], MRN [id number]  PCP:  Collene MaresMiller, Virginia E, PA   [facility] Providers Cardiologist:  Erienne Spelman   }    Referring MD: Collene MaresMiller, [place] E, [place]   Chief Complaint  Patient presents with   Hypertension   Palpitations     History of Present Illness:  01/19/22   Michelle Villa is a 36 y.o. female with a hx of anxiety, depression . 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   Was diagnosed in 2018.   She has had removal of her 1st rib and has had stenting of her L subclavian artery .   Hx of arterial embolization to her left hand   Arteriogram by Dr. Chestine Sporelark showed an occluded left radial artery and occluded ulnar artery . She had multiple collaterals at the wrist  Had significant embolectomy of her left forearm  L radial artery remains occluded The L ulnar artery is patent   Still has some tingling that come and goes.  She is able to improve the tingling in her L arm by moving it to a different position .    She has had stenting of hte L subclavian artery   She was seen by Dr. Bufford Buttner'Neill in May, 2021 for episodes of chest pain.  These pains were described as sharp sharp and dull.  They lasted for about 3 to 4 minutes.  Her primary doctor, Dr. Hyman HopesWebb was concerned about the arterial emboli in her left arm.  They were questioning whether or not Michelle Villa should go back on birth control pills because of this clotting. I reviewed the hematology notes,   I seen no mention of hypercoagulable state.    Was off birth control for years. Had severe periodds [facility]   In Oct. 2020 , had covid Developed CP   Had a negative stress echo in June, 2021   Current issues:  Started having hot flashes,   The hot flashes come in waves  Started propranolol 20 mg at night which helped the heat waves slightly,  but make her fuzzy .   Sees Evangeline DakinVirginia Miller  ( PCP)   TSH was normal in June, 2023  Frerquent headaches  + family hx of HTN  Likes salty foods.  But does not eat much salt Eats some processed vegan foods.    Sept. 11, 2023 Haywood LassoLynette is seen for follow up of her palpitations, anxiety, The Toprol XL seems to be helping  She forgot it 1 day and felt much worse that day  Reviewed labs from her primary medical doctor. Her CBC was largely normal.  Hemoglobin is 14.1. TSH was 0.53. Complete metabolic profile has been largely normal.  Occasionally her glucose levels run a little low.   June 22, 2022: Michelle Villa is seen today for follow-up visit.  She has a history of palpitations and anxiety.  When I last saw her the Toprol-XL seems to be helping.  We increased her Toprol-XL to 50 mg a day.  She has had some costochondritis and rib tenderness. Is fatigued , does not seem to be associated with the higher dose of toprol XL   Is tapering off from her Meclizine - this has caused some issues   Is exercising ,   does PT,  15 minutes of exercise     Past  Medical History:  Diagnosis Date   Anxiety    Depression    Raynaud phenomenon    Thyroid cyst    3 tumors on thyroid- all biopsied & benign     Past Surgical History:  Procedure Laterality Date   AORTIC ARCH ANGIOGRAPHY N/A 09/11/2016   Procedure: Aortic Arch Angiography;  Surgeon: Sherren Kerns, MD;  Location: [facility] INVASIVE CV LAB;  Service: Cardiovascular;  Laterality: N/A;   EMBOLECTOMY Left 09/28/2016   Procedure: Left Brachial, radial, ulnar and intraosseous artery thrombectomy;  Surgeon: Sherren Kerns, MD;  Location: [facility] OR;  Service: Vascular;  Laterality: Left;   HYSTEROSCOPY N/A 12/28/2018   Procedure: HYSTEROSCOPY/D&C;  Surgeon: Gerald Leitz, MD;  Location: [facility] OR;  Service: Gynecology;  Laterality: N/A;   I & D EXTREMITY Left 09/28/2016   Procedure: EVACUATION HEMATOMA NECK;  Surgeon: Sherren Kerns, MD;  Location: [facility] OR;   Service: Vascular;  Laterality: Left;   LAPAROSCOPY Left 12/28/2018   Procedure: Operative Laparoscopy with left oophorectomy;  Surgeon: Gerald Leitz, MD;  Location: [facility] OR;  Service: Gynecology;  Laterality: Left;   PATCH ANGIOPLASTY Left 09/28/2016   Procedure: PATCH ANGIOPLASTY BRACHIAL ARTERY USING VIEN HARVESTED FROM PATIENT;  Surgeon: Sherren Kerns, MD;  Location: [facility] OR;  Service: Vascular;  Laterality: Left;   PERIPHERAL VASCULAR INTERVENTION Left 09/28/2016   Procedure: LEFT SUBCLAVIAN STENT;  Surgeon: Sherren Kerns, MD;  Location: [facility] OR;  Service: Vascular;  Laterality: Left;   RIB RESECTION Left 09/28/2016   Procedure: FIRST RIB RESECTION;  Surgeon: Sherren Kerns, MD;  Location: [facility] OR;  Service: Vascular;  Laterality: Left;   UPPER EXTREMITY ANGIOGRAPHY N/A 09/11/2016   Procedure: Upper Extremity Angiography - Left;  Surgeon: Sherren Kerns, MD;  Location: [facility] INVASIVE CV LAB;  Service: Cardiovascular;  Laterality: N/A;   UPPER EXTREMITY ANGIOGRAPHY Left 08/23/2019   Procedure: UPPER EXTREMITY ANGIOGRAPHY - Left;  Surgeon: Cephus Shelling, MD;  Location: [facility] INVASIVE CV LAB;  Service: Cardiovascular;  Laterality: Left;   WISDOM TOOTH EXTRACTION      Current Medications: Current Meds  Medication Sig   acetaminophen (TYLENOL) 325 MG tablet Take 650 mg by mouth every 6 (six) hours as needed for moderate pain or headache.    ALPRAZolam (XANAX) 1 MG tablet Take 0.5 mg by mouth 2 (two) times daily as needed for anxiety.    amphetamine-dextroamphetamine (ADDERALL XR) 30 MG 24 hr capsule Take 30 mg by mouth daily.   BIOTIN PO Take 1 tablet by mouth daily. 5000 mcg   Cyanocobalamin (B-12 PO) Take 1 tablet by mouth daily.   meclizine (ANTIVERT) 12.5 MG tablet Take 12.5 mg by mouth in the morning.   meloxicam (MOBIC) 15 MG tablet Take 1 tablet by mouth daily as needed.   metoprolol succinate (TOPROL-XL) 50 MG 24 hr tablet Take 1 tablet (50 mg total) by mouth daily. Take with or  immediately following a meal.   Multiple Vitamin (MULTIVITAMIN) capsule Take 1 capsule by mouth daily.    traMADol (ULTRAM) 50 MG tablet Take 50 mg by mouth daily as needed for moderate pain.      Allergies:   Other   Social History   Socioeconomic History   Marital status: Married    Spouse name: Not on file   Number of children: Not on file   Years of education: Not on file   Highest education level: Not on file  Occupational History   Occupation: waitress  Tobacco Use  Smoking status: Never   Smokeless tobacco: Never  Vaping Use   Vaping Use: Never used  Substance and Sexual Activity   Alcohol use: Yes    Alcohol/week: 7.0 standard drinks of alcohol    Types: 7 Standard drinks or equivalent per week    Comment: 7 per week average   Drug use: No   Sexual activity: Not on file  Other Topics Concern   Not on file  Social History Narrative   Not on file   Social Determinants of Health   Financial Resource Strain: Not on file  Food Insecurity: Not on file  Transportation Needs: Not on file  Physical Activity: Not on file  Stress: Not on file  Social Connections: Not on file     Family History: The patient's family history includes Hyperlipidemia in her father; Hypertension in her father. There is no history of Neuropathy.  ROS:   Please see the history of present illness.     All other systems reviewed and are negative.  EKGs/Labs/Other Studies Reviewed:    The following studies were reviewed today:   EKG:  Recent Labs: No results found for requested labs within last 365 days.  Recent Lipid Panel No results found for: "CHOL", "TRIG", "HDL", "CHOLHDL", "VLDL", "LDLCALC", "LDLDIRECT"   Risk Assessment/Calculations:           Physical Exam:    Physical Exam: Blood pressure (!) 126/92, pulse 93, height 5\' 3"  (1.6 m), weight 157 lb 9.6 oz (71.5 kg), SpO2 99 %.     GEN:  Well nourished, well developed in no acute distress HEENT: Normal NECK: No  JVD; No carotid bruits LYMPHATICS: No lymphadenopathy CARDIAC: RRR  soft systolic murmur  RESPIRATORY:  Clear to auscultation without rales, wheezing or rhonchi  ABDOMEN: Soft, non-tender, non-distended MUSCULOSKELETAL:  No edema; No deformity  SKIN: Warm and dry NEUROLOGIC:  Alert and oriented x 3   ASSESSMENT:    No diagnosis found.  PLAN:      Hypertension:  well controlled  Palpitations:  better on toprol XL .  Continue  She is having some fatigue.  She does not associate that with the higher dose of metoprolol.  2.  Costochondritis:  this seems to have resolved.   3.  Thoracic outlet syndrome: She has a history of thoracic outlet syndrome with compromised circulation to her left arm.  She was previously followed by Dr. at [facility].  She was told to return in about 3 years which is coming up due.  She will call the office to see if she can make an appointment.  Otherwise we will make a new referral for her to be followed in the vein and vascular clinic.     See her again in 3 months.   Medication Adjustments/Labs and Tests Ordered: Current medicines are reviewed at length with the patient today.  Concerns regarding medicines are outlined above.  No orders of the defined types were placed in this encounter.  No orders of the defined types were placed in this encounter.     Patient Instructions  Medication Instructions:  Your physician recommends that you continue on your current medications as directed. Please refer to the Current Medication list given to you today.  *If you need a refill on your cardiac medications before your next appointment, please call your pharmacy*   Lab Work: NONE If you have labs (blood work) drawn today and your tests are completely normal, you will receive your  results only by: MyChart Message (if you have MyChart) OR A paper copy in the mail If you have any lab test that is abnormal or we need to change  your treatment, we will call you to review the results.   Testing/Procedures: NONE   Follow-Up: At [facility], you and your health needs are our priority.  As part of our continuing mission to provide you with exceptional heart care, we have created designated Provider Care Teams.  These Care Teams include your primary Cardiologist (physician) and Advanced Practice Providers (APPs -  Physician Assistants and Nurse Practitioners) who all work together to provide you with the care you need, when you need it.  We recommend signing up for the patient portal called "MyChart".  Sign up information is provided on this After Visit Summary.  MyChart is used to connect with patients for Virtual Visits (Telemedicine).  Patients are able to view lab/test results, encounter notes, upcoming appointments, etc.  Non-urgent messages can be sent to your provider as well.   To learn more about what you can do with MyChart, go to [email or web address].    Your next appointment:   1 year(s)  The format for your next appointment:   In Person  Provider:   Kristeen Miss, MD      Important Information About Sugar         Signed, Kristeen Miss, MD  07/08/2022 3:30 PM    [facility]

## 2022-06-23 ENCOUNTER — Ambulatory Visit: Payer: BC Managed Care – PPO | Attending: Cardiovascular Disease | Admitting: Cardiovascular Disease

## 2022-06-23 ENCOUNTER — Encounter: Payer: Self-pay | Admitting: Cardiovascular Disease

## 2022-06-23 VITALS — BP 126/92 | HR 93 | Ht 63.0 in | Wt 157.6 lb

## 2022-06-23 DIAGNOSIS — I7300 Raynaud's syndrome without gangrene: Secondary | ICD-10-CM

## 2022-06-23 DIAGNOSIS — R002 Palpitations: Secondary | ICD-10-CM | POA: Diagnosis not present

## 2022-06-23 NOTE — Patient Instructions (Signed)
Medication Instructions:  Your physician recommends that you continue on your current medications as directed. Please refer to the Current Medication list given to you today.  *If you need a refill on your cardiac medications before your next appointment, please call your pharmacy*   Lab Work: NONE If you have labs (blood work) drawn today and your tests are completely normal, you will receive your results only by: MyChart Message (if you have MyChart) OR A paper copy in the mail If you have any lab test that is abnormal or we need to change your treatment, we will call you to review the results.   Testing/Procedures: NONE   Follow-Up: At [facility], you and your health needs are our priority.  As part of our continuing mission to provide you with exceptional heart care, we have created designated Provider Care Teams.  These Care Teams include your primary Cardiologist (physician) and Advanced Practice Providers (APPs -  Physician Assistants and Nurse Practitioners) who all work together to provide you with the care you need, when you need it.  We recommend signing up for the patient portal called "MyChart".  Sign up information is provided on this After Visit Summary.  MyChart is used to connect with patients for Virtual Visits (Telemedicine).  Patients are able to view lab/test results, encounter notes, upcoming appointments, etc.  Non-urgent messages can be sent to your provider as well.   To learn more about what you can do with MyChart, go to [email or web address].    Your next appointment:   1 year(s)  The format for your next appointment:   In Person  Provider:   Philip Nahser, MD       Important Information About Sugar       

## 2022-06-29 ENCOUNTER — Other Ambulatory Visit: Payer: Self-pay | Admitting: Internal Medicine

## 2022-06-29 ENCOUNTER — Ambulatory Visit
Admission: RE | Admit: 2022-06-29 | Discharge: 2022-06-29 | Disposition: A | Discharge status: Home / Self Care | Payer: BC Managed Care – PPO | Source: Ambulatory Visit | Attending: Internal Medicine | Admitting: Internal Medicine

## 2022-06-29 DIAGNOSIS — R509 Fever, unspecified: Secondary | ICD-10-CM

## 2022-06-29 DIAGNOSIS — J189 Pneumonia, unspecified organism: Secondary | ICD-10-CM | POA: Diagnosis not present

## 2022-06-29 DIAGNOSIS — J181 Lobar pneumonia, unspecified organism: Secondary | ICD-10-CM | POA: Diagnosis not present

## 2022-06-29 DIAGNOSIS — J069 Acute upper respiratory infection, unspecified: Secondary | ICD-10-CM | POA: Diagnosis not present

## 2022-06-29 DIAGNOSIS — R051 Acute cough: Secondary | ICD-10-CM | POA: Diagnosis not present

## 2022-07-28 DIAGNOSIS — E039 Hypothyroidism, unspecified: Secondary | ICD-10-CM | POA: Diagnosis not present

## 2022-07-28 DIAGNOSIS — Z789 Other specified health status: Secondary | ICD-10-CM | POA: Diagnosis not present

## 2022-07-28 DIAGNOSIS — E781 Pure hyperglyceridemia: Secondary | ICD-10-CM | POA: Diagnosis not present

## 2022-07-28 DIAGNOSIS — G894 Chronic pain syndrome: Secondary | ICD-10-CM | POA: Diagnosis not present

## 2022-07-28 DIAGNOSIS — Z0000 Encounter for general adult medical examination without abnormal findings: Secondary | ICD-10-CM | POA: Diagnosis not present

## 2022-07-28 DIAGNOSIS — G540 Brachial plexus disorders: Secondary | ICD-10-CM | POA: Diagnosis not present

## 2022-08-03 ENCOUNTER — Other Ambulatory Visit: Payer: Self-pay | Admitting: Internal Medicine

## 2022-08-03 ENCOUNTER — Ambulatory Visit
Admission: RE | Admit: 2022-08-03 | Discharge: 2022-08-03 | Disposition: A | Discharge status: Home / Self Care | Payer: BC Managed Care – PPO | Source: Ambulatory Visit | Attending: Internal Medicine | Admitting: Internal Medicine

## 2022-08-03 DIAGNOSIS — J181 Lobar pneumonia, unspecified organism: Secondary | ICD-10-CM | POA: Diagnosis not present

## 2022-08-03 DIAGNOSIS — Z8701 Personal history of pneumonia (recurrent): Secondary | ICD-10-CM

## 2022-09-03 DIAGNOSIS — F331 Major depressive disorder, recurrent, moderate: Secondary | ICD-10-CM | POA: Diagnosis not present

## 2022-09-03 DIAGNOSIS — F411 Generalized anxiety disorder: Secondary | ICD-10-CM | POA: Diagnosis not present

## 2022-09-16 DIAGNOSIS — F331 Major depressive disorder, recurrent, moderate: Secondary | ICD-10-CM | POA: Diagnosis not present

## 2022-09-16 DIAGNOSIS — F411 Generalized anxiety disorder: Secondary | ICD-10-CM | POA: Diagnosis not present

## 2022-11-02 DIAGNOSIS — F331 Major depressive disorder, recurrent, moderate: Secondary | ICD-10-CM | POA: Diagnosis not present

## 2022-11-02 DIAGNOSIS — F902 Attention-deficit hyperactivity disorder, combined type: Secondary | ICD-10-CM | POA: Diagnosis not present

## 2022-11-02 DIAGNOSIS — G894 Chronic pain syndrome: Secondary | ICD-10-CM | POA: Diagnosis not present

## 2022-11-02 DIAGNOSIS — F418 Other specified anxiety disorders: Secondary | ICD-10-CM | POA: Diagnosis not present

## 2022-11-02 DIAGNOSIS — K0889 Other specified disorders of teeth and supporting structures: Secondary | ICD-10-CM | POA: Diagnosis not present

## 2022-11-02 DIAGNOSIS — F411 Generalized anxiety disorder: Secondary | ICD-10-CM | POA: Diagnosis not present

## 2022-11-16 DIAGNOSIS — F331 Major depressive disorder, recurrent, moderate: Secondary | ICD-10-CM | POA: Diagnosis not present

## 2022-11-16 DIAGNOSIS — F411 Generalized anxiety disorder: Secondary | ICD-10-CM | POA: Diagnosis not present

## 2022-11-30 DIAGNOSIS — F331 Major depressive disorder, recurrent, moderate: Secondary | ICD-10-CM | POA: Diagnosis not present

## 2022-11-30 DIAGNOSIS — F411 Generalized anxiety disorder: Secondary | ICD-10-CM | POA: Diagnosis not present

## 2022-12-14 DIAGNOSIS — F411 Generalized anxiety disorder: Secondary | ICD-10-CM | POA: Diagnosis not present

## 2022-12-14 DIAGNOSIS — F331 Major depressive disorder, recurrent, moderate: Secondary | ICD-10-CM | POA: Diagnosis not present

## 2022-12-21 DIAGNOSIS — F331 Major depressive disorder, recurrent, moderate: Secondary | ICD-10-CM | POA: Diagnosis not present

## 2022-12-21 DIAGNOSIS — F411 Generalized anxiety disorder: Secondary | ICD-10-CM | POA: Diagnosis not present

## 2022-12-29 DIAGNOSIS — L509 Urticaria, unspecified: Secondary | ICD-10-CM | POA: Diagnosis not present

## 2023-01-04 DIAGNOSIS — F331 Major depressive disorder, recurrent, moderate: Secondary | ICD-10-CM | POA: Diagnosis not present

## 2023-01-04 DIAGNOSIS — F411 Generalized anxiety disorder: Secondary | ICD-10-CM | POA: Diagnosis not present

## 2023-01-18 DIAGNOSIS — F331 Major depressive disorder, recurrent, moderate: Secondary | ICD-10-CM | POA: Diagnosis not present

## 2023-01-18 DIAGNOSIS — F411 Generalized anxiety disorder: Secondary | ICD-10-CM | POA: Diagnosis not present

## 2023-01-22 ENCOUNTER — Other Ambulatory Visit (HOSPITAL_COMMUNITY)
Admission: RE | Admit: 2023-01-22 | Discharge: 2023-01-22 | Disposition: A | Discharge status: Home / Self Care | Payer: BC Managed Care – PPO | Source: Ambulatory Visit | Attending: Obstetrics and Gynecology | Admitting: Obstetrics and Gynecology

## 2023-01-22 ENCOUNTER — Other Ambulatory Visit: Payer: Self-pay | Admitting: Cardiovascular Disease

## 2023-01-22 ENCOUNTER — Other Ambulatory Visit: Payer: Self-pay | Admitting: Obstetrics and Gynecology

## 2023-01-22 DIAGNOSIS — G540 Brachial plexus disorders: Secondary | ICD-10-CM

## 2023-01-22 DIAGNOSIS — Z01419 Encounter for gynecological examination (general) (routine) without abnormal findings: Secondary | ICD-10-CM | POA: Insufficient documentation

## 2023-01-22 DIAGNOSIS — N946 Dysmenorrhea, unspecified: Secondary | ICD-10-CM | POA: Diagnosis not present

## 2023-01-22 DIAGNOSIS — R6889 Other general symptoms and signs: Secondary | ICD-10-CM

## 2023-01-22 DIAGNOSIS — A630 Anogenital (venereal) warts: Secondary | ICD-10-CM | POA: Diagnosis not present

## 2023-01-22 DIAGNOSIS — I10 Essential (primary) hypertension: Secondary | ICD-10-CM

## 2023-01-26 DIAGNOSIS — L578 Other skin changes due to chronic exposure to nonionizing radiation: Secondary | ICD-10-CM | POA: Diagnosis not present

## 2023-01-26 DIAGNOSIS — D1801 Hemangioma of skin and subcutaneous tissue: Secondary | ICD-10-CM | POA: Diagnosis not present

## 2023-01-26 DIAGNOSIS — L814 Other melanin hyperpigmentation: Secondary | ICD-10-CM | POA: Diagnosis not present

## 2023-01-26 DIAGNOSIS — L821 Other seborrheic keratosis: Secondary | ICD-10-CM | POA: Diagnosis not present

## 2023-01-26 LAB — CYTOLOGY - PAP
Comment: NEGATIVE
Diagnosis: NEGATIVE
High risk HPV: NEGATIVE

## 2023-02-03 DIAGNOSIS — N946 Dysmenorrhea, unspecified: Secondary | ICD-10-CM | POA: Diagnosis not present

## 2023-02-08 DIAGNOSIS — G894 Chronic pain syndrome: Secondary | ICD-10-CM | POA: Diagnosis not present

## 2023-02-08 DIAGNOSIS — G540 Brachial plexus disorders: Secondary | ICD-10-CM | POA: Diagnosis not present

## 2023-02-08 DIAGNOSIS — F418 Other specified anxiety disorders: Secondary | ICD-10-CM | POA: Diagnosis not present

## 2023-02-08 DIAGNOSIS — F902 Attention-deficit hyperactivity disorder, combined type: Secondary | ICD-10-CM | POA: Diagnosis not present

## 2023-02-11 DIAGNOSIS — F411 Generalized anxiety disorder: Secondary | ICD-10-CM | POA: Diagnosis not present

## 2023-02-11 DIAGNOSIS — F331 Major depressive disorder, recurrent, moderate: Secondary | ICD-10-CM | POA: Diagnosis not present

## 2023-02-15 DIAGNOSIS — F331 Major depressive disorder, recurrent, moderate: Secondary | ICD-10-CM | POA: Diagnosis not present

## 2023-02-15 DIAGNOSIS — F411 Generalized anxiety disorder: Secondary | ICD-10-CM | POA: Diagnosis not present

## 2023-02-22 DIAGNOSIS — R102 Pelvic and perineal pain: Secondary | ICD-10-CM | POA: Diagnosis not present

## 2023-02-22 DIAGNOSIS — N83201 Unspecified ovarian cyst, right side: Secondary | ICD-10-CM | POA: Diagnosis not present

## 2023-02-25 DIAGNOSIS — F411 Generalized anxiety disorder: Secondary | ICD-10-CM | POA: Diagnosis not present

## 2023-02-25 DIAGNOSIS — F331 Major depressive disorder, recurrent, moderate: Secondary | ICD-10-CM | POA: Diagnosis not present

## 2023-03-01 DIAGNOSIS — F411 Generalized anxiety disorder: Secondary | ICD-10-CM | POA: Diagnosis not present

## 2023-03-01 DIAGNOSIS — F331 Major depressive disorder, recurrent, moderate: Secondary | ICD-10-CM | POA: Diagnosis not present

## 2023-03-04 DIAGNOSIS — R102 Pelvic and perineal pain: Secondary | ICD-10-CM | POA: Diagnosis not present

## 2023-03-04 DIAGNOSIS — F902 Attention-deficit hyperactivity disorder, combined type: Secondary | ICD-10-CM | POA: Diagnosis not present

## 2023-03-22 ENCOUNTER — Other Ambulatory Visit: Payer: Self-pay | Admitting: Cardiovascular Disease

## 2023-04-01 DIAGNOSIS — R102 Pelvic and perineal pain: Secondary | ICD-10-CM | POA: Diagnosis not present

## 2023-04-26 DIAGNOSIS — F411 Generalized anxiety disorder: Secondary | ICD-10-CM | POA: Diagnosis not present

## 2023-04-26 DIAGNOSIS — F331 Major depressive disorder, recurrent, moderate: Secondary | ICD-10-CM | POA: Diagnosis not present

## 2023-05-03 DIAGNOSIS — G894 Chronic pain syndrome: Secondary | ICD-10-CM | POA: Diagnosis not present

## 2023-05-03 DIAGNOSIS — F902 Attention-deficit hyperactivity disorder, combined type: Secondary | ICD-10-CM | POA: Diagnosis not present

## 2023-05-03 DIAGNOSIS — F418 Other specified anxiety disorders: Secondary | ICD-10-CM | POA: Diagnosis not present

## 2023-05-04 DIAGNOSIS — F411 Generalized anxiety disorder: Secondary | ICD-10-CM | POA: Diagnosis not present

## 2023-05-04 DIAGNOSIS — F331 Major depressive disorder, recurrent, moderate: Secondary | ICD-10-CM | POA: Diagnosis not present

## 2023-05-10 DIAGNOSIS — N946 Dysmenorrhea, unspecified: Secondary | ICD-10-CM | POA: Diagnosis not present

## 2023-05-11 DIAGNOSIS — F331 Major depressive disorder, recurrent, moderate: Secondary | ICD-10-CM | POA: Diagnosis not present

## 2023-05-11 DIAGNOSIS — F411 Generalized anxiety disorder: Secondary | ICD-10-CM | POA: Diagnosis not present

## 2023-05-18 DIAGNOSIS — F331 Major depressive disorder, recurrent, moderate: Secondary | ICD-10-CM | POA: Diagnosis not present

## 2023-05-18 DIAGNOSIS — F411 Generalized anxiety disorder: Secondary | ICD-10-CM | POA: Diagnosis not present

## 2023-05-25 DIAGNOSIS — F331 Major depressive disorder, recurrent, moderate: Secondary | ICD-10-CM | POA: Diagnosis not present

## 2023-05-25 DIAGNOSIS — F411 Generalized anxiety disorder: Secondary | ICD-10-CM | POA: Diagnosis not present

## 2023-06-03 DIAGNOSIS — F331 Major depressive disorder, recurrent, moderate: Secondary | ICD-10-CM | POA: Diagnosis not present

## 2023-06-03 DIAGNOSIS — F411 Generalized anxiety disorder: Secondary | ICD-10-CM | POA: Diagnosis not present

## 2023-06-08 DIAGNOSIS — F331 Major depressive disorder, recurrent, moderate: Secondary | ICD-10-CM | POA: Diagnosis not present

## 2023-06-08 DIAGNOSIS — F411 Generalized anxiety disorder: Secondary | ICD-10-CM | POA: Diagnosis not present

## 2023-06-22 ENCOUNTER — Other Ambulatory Visit: Payer: Self-pay | Admitting: Cardiovascular Disease

## 2023-07-05 DIAGNOSIS — F331 Major depressive disorder, recurrent, moderate: Secondary | ICD-10-CM | POA: Diagnosis not present

## 2023-07-05 DIAGNOSIS — F411 Generalized anxiety disorder: Secondary | ICD-10-CM | POA: Diagnosis not present

## 2023-07-08 ENCOUNTER — Encounter (HOSPITAL_BASED_OUTPATIENT_CLINIC_OR_DEPARTMENT_OTHER): Payer: Self-pay | Admitting: Obstetrics & Gynecology

## 2023-07-08 DIAGNOSIS — Z01818 Encounter for other preprocedural examination: Secondary | ICD-10-CM | POA: Diagnosis not present

## 2023-07-08 NOTE — Progress Notes (Addendum)
Spoke w/ via phone for pre-op interview--- pt Lab needs dos----   urine preg      Lab results------ lab appt 07-13-2023 @ 0900 getting CBC/ CMP/ T&S/ EKG COVID test -----patient states asymptomatic no test needed Arrive at -------  0530 on 07-19-2023 NPO after MN with exception sips of water with medication Med rec completed Medications to take morning of surgery ----- metoprolol, xanax if needed Diabetic medication -----  n/a Patient instructed no nail polish to be worn day of surgery Patient instructed to bring photo id and insurance card day of surgery Patient aware to have Driver (ride ) / caregiver    for 24 hours after surgery - husband, Michelle Villa Patient Special Instructions ----- will pick up bag at lab appointment that has hibiclens and written instructions Pre-Op special Instructions ----- pt has compromised circulation left arm ,  no sticks/ bp Patient verbalized understanding of instructions that were given at this phone interview. Patient denies chest pain, sob, fever, cough at the interview.

## 2023-07-08 NOTE — Progress Notes (Signed)
Your procedure is scheduled on :  Monday,  07-19-2023  Report to [facility] AT  __5:30_ AM.   Call this number if you have problems the morning of surgery  :[phone]. Any questions prior to surgery call pre-op nurse, Brandilee Pies :  [phone]   OUR ADDRESS IS [address].  WE ARE LOCATED IN [facility]  PLEASE BRING YOUR INSURANCE CARD AND PHOTO ID DAY OF SURGERY.                                     REMEMBER: Do not have anything to eat or drink after midnight night before surgery, absolutely nothing by mouth.  This includes no water,  candy/  gum/  mints.   Please brush your teeth morning of surgery and rinse mouth out.  ___________________________________________________________________     TAKE ONLY THESE MEDICATIONS MORNING OF SURGERY: Take with sips of water Metoprolol (toprol) If needed may take Alprazolam (xanax)                                        DO NOT WEAR JEWERLY/  METAL/  PIERCINGS (INCLUDING NO PLASTIC PIERCINGS) DO NOT WEAR LOTIONS, POWDERS, PERFUMES OR NAIL POLISH ON YOUR FINGERNAILS. TOENAIL POLISH IS OK TO WEAR. DO NOT SHAVE FOR 48 HOURS PRIOR TO DAY OF SURGERY.  CONTACTS, GLASSES, OR DENTURES MAY NOT BE WORN TO SURGERY.  REMEMBER: NO SMOKING, VAPING ,  DRUGS OR ALCOHOL FOR 24 HOURS BEFORE YOUR SURGERY.                                    [facility] IS NOT RESPONSIBLE  FOR ANY BELONGINGS.                                                                    Marland Kitchen           [place] - Preparing for Surgery Before surgery, you can play an important role.  Because skin is not sterile, your skin needs to be as free of germs as possible.  You can reduce the number of germs on your skin by washing with CHG (chlorahexidine gluconate) soap before surgery.  CHG is an antiseptic cleaner which kills germs and bonds with the skin to continue killing germs even after washing. Please DO NOT use if you have an allergy  to CHG or antibacterial soaps.  If your skin becomes reddened/irritated stop using the CHG and inform your nurse when you arrive at Short Stay. Do not shave (including legs and underarms) for at least 48 hours prior to the first CHG shower.  You may shave your face/neck. Please follow these instructions carefully:  1.  Shower with CHG Soap the night before surgery and the  morning of Surgery.  2.  If you choose to wash your hair, wash your hair first as usual with your  normal  shampoo.  3.  After you shampoo, rinse your hair  and body thoroughly to remove the  shampoo.                                        4.  Use CHG as you would any other liquid soap.  You can apply chg directly  to the skin and wash , chg soap provided, night before and morning of your surgery.  5.  Apply the CHG Soap to your body ONLY FROM THE NECK DOWN.   Do not use on face/ open                           Wound or open sores. Avoid contact with eyes, ears mouth and genitals (private parts).                       Wash face,  Genitals (private parts) with your normal soap.             6.  Wash thoroughly, paying special attention to the area where your surgery  will be performed.  7.  Thoroughly rinse your body with warm water from the neck down.  8.  DO NOT shower/wash with your normal soap after using and rinsing off  the CHG Soap.             9.  Pat yourself dry with a clean towel.            10.  Wear clean pajamas.            11.  Place clean sheets on your bed the night of your first shower and do not  sleep with pets. Day of Surgery : Do not apply any lotions/ powders the morning of surgery.  Please wear clean clothes to the hospital/surgery center.  IF YOU HAVE ANY SKIN IRRITATION OR PROBLEMS WITH THE SURGICAL SOAP, PLEASE GET A BAR OF GOLD DIAL SOAP AND SHOWER THE NIGHT BEFORE YOUR SURGERY AND THE MORNING OF YOUR SURGERY. PLEASE LET THE NURSE KNOW MORNING OF YOUR SURGERY IF YOU HAD ANY PROBLEMS WITH THE SURGICAL  SOAP.   YOUR SURGEON MAY HAVE REQUESTED EXTENDED RECOVERY TIME AFTER YOUR SURGERY. IT COULD BE A  JUST A FEW HOURS  UP TO AN OVERNIGHT STAY.  YOUR SURGEON SHOULD HAVE DISCUSSED THIS WITH YOU PRIOR TO YOUR SURGERY. IN THE EVENT YOU NEED TO STAY OVERNIGHT PLEASE REFER TO THE FOLLOWING GUIDELINES. YOU MAY HAVE UP TO 4 VISITORS  MAY VISIT IN THE EXTENDED RECOVERY ROOM UNTIL 800 PM ONLY.  ONE  VISITOR AGE 55 AND OVER MAY SPEND THE NIGHT AND MUST BE IN EXTENDED RECOVERY ROOM NO LATER THAN 800 PM . YOUR DISCHARGE TIME AFTER YOU SPEND THE NIGHT IS 900 AM THE MORNING AFTER YOUR SURGERY. YOU MAY PACK A SMALL OVERNIGHT BAG WITH TOILETRIES FOR YOUR OVERNIGHT STAY IF YOU WISH.  REGARDLESS OF IF YOU STAY OVER NIGHT OR ARE DISCHARGED THE SAME DAY YOU WILL BE REQUIRED TO HAVE A RESPONSIBLE ADULT (18 YRS OLD OR OLDER) STAY WITH YOU FOR AT LEAST THE FIRST 39 HOURS WHEN HOME.  YOUR PRESCRIPTION MEDICATIONS WILL BE PROVIDED DURING [facility] STAY.  ________________________________________________________________________

## 2023-07-11 ENCOUNTER — Other Ambulatory Visit: Payer: Self-pay | Admitting: Cardiovascular Disease

## 2023-07-13 ENCOUNTER — Other Ambulatory Visit: Payer: Self-pay

## 2023-07-13 ENCOUNTER — Encounter (HOSPITAL_COMMUNITY)
Admission: RE | Admit: 2023-07-13 | Discharge: 2023-07-13 | Disposition: A | Discharge status: Home / Self Care | Payer: BC Managed Care – PPO | Source: Ambulatory Visit | Attending: Obstetrics & Gynecology | Admitting: Obstetrics & Gynecology

## 2023-07-13 DIAGNOSIS — Z01818 Encounter for other preprocedural examination: Secondary | ICD-10-CM | POA: Insufficient documentation

## 2023-07-13 DIAGNOSIS — R102 Pelvic and perineal pain: Secondary | ICD-10-CM | POA: Diagnosis not present

## 2023-07-13 DIAGNOSIS — G8929 Other chronic pain: Secondary | ICD-10-CM | POA: Insufficient documentation

## 2023-07-13 LAB — COMPREHENSIVE METABOLIC PANEL
ALT: 25 U/L (ref 0–44)
AST: 39 U/L (ref 15–41)
Albumin: 3.8 g/dL (ref 3.5–5.0)
Alkaline Phosphatase: 53 U/L (ref 38–126)
Anion gap: 8 (ref 5–15)
BUN: 12 mg/dL (ref 6–20)
CO2: 24 mmol/L (ref 22–32)
Calcium: 8.6 mg/dL — ABNORMAL LOW (ref 8.9–10.3)
Chloride: 104 mmol/L (ref 98–111)
Creatinine, Ser: 0.67 mg/dL (ref 0.44–1.00)
GFR, Estimated: >60 mL/min (ref >60)
Glucose, Bld: 86 mg/dL (ref 70–99)
Potassium: 4.2 mmol/L (ref 3.5–5.1)
Sodium: 136 mmol/L (ref 135–145)
Total Bilirubin: 0.4 mg/dL (ref 0.0–1.2)
Total Protein: 6.5 g/dL (ref 6.5–8.1)

## 2023-07-13 LAB — CBC
HCT: 39.3 % (ref 36.0–46.0)
Hemoglobin: 13.9 g/dL (ref 12.0–15.0)
MCH: 36.4 pg — ABNORMAL HIGH (ref 26.0–34.0)
MCHC: 35.4 g/dL (ref 30.0–36.0)
MCV: 102.9 fL — ABNORMAL HIGH (ref 80.0–100.0)
Platelets: 221 10*3/uL (ref 150–400)
RBC: 3.82 MIL/uL — ABNORMAL LOW (ref 3.87–5.11)
RDW: 12.4 % (ref 11.5–15.5)
WBC: 6.9 10*3/uL (ref 4.0–10.5)
nRBC: 0 % (ref 0.0–0.2)

## 2023-07-15 NOTE — H&P (Signed)
 Michelle Villa is an 38 y.o. female G0 with chronic pelvic pain.  Patient reports pelvic pain x years; worse during menses.  It comes and goes in intensity but is present all the time.  No dyspareunia.  Patient is s/p H/S, D&C, endometrial ablation and left oophorectomy (corpora albicantia) in 2020 for menorrhagia and left adnexal mass.  Partner s/p vasectomy.  U/S 02/03/23 showed 92 cc uterine volume with 3.5 mm EMS.  7 small fibroids; largest 1.5 cm.  No GI complaints.    Of note, patient has PMH of LUE thrombus secondary to Thoracic Outlet Syndrome.  She required surgery to remove clot as well as left first rib.  Normal hematology w/u but recommended no estrogen containing medications.    Pertinent Gynecological History: Menses: flow is light Bleeding: regular Contraception: vasectomy DES exposure: unknown Blood transfusions: none Sexually transmitted diseases: no past history Previous GYN Procedures:  H/S, D&C, ablation, left oophorectomy   Last mammogram:  n/a  Date: n/a Last pap: normal Date: 01/22/23 OB History: G0   Menstrual History: Menarche age: n/a No LMP recorded. (Menstrual status: Irregular Periods).    Past Medical History:  Diagnosis Date   ADHD (attention deficit hyperactivity disorder)    Arterial thoracic outlet syndrome of left subclavian artery (HCC) 2018   followed by vascular--- dr fields (q92yrs);   arteriogram 09-11-2016  occluded left branchial, radial, ulnar arteries w/ multiple collaterals at the wrist and 70% narrowwing left subclavian artery;   09-28-2016   arterial thrombectomy left branchial / radial/ ulnar/ & intraosseous ;  patch angioplasty brachial artery ;  left first rib resection and left subclavian stent;   Chronic pelvic pain in female    Depression    GAD (generalized anxiety disorder)    GERD (gastroesophageal reflux disease)    watches diet   Heart palpitations    cardiologist--- dr calhoun;    stress echo w/ dobutamin 12-14-2019  normal w/  no ischemia, ef 60%   History of panic attacks    Hypertension    metoprolol    Impaired circulation 2018   compromised circulation to patient's left arm due to thoracic outlet syndrome   Multiple thyroid  nodules 2018   last ultrasound in epic 09-23-2016 shows multiple thyroid  nodules;  10-22-2016 biopsy left nodule and isthmus nodule,  both benign   Neuropathy of left hand    intermittant numbness/ tingling   Wears glasses    Wears partial dentures    upper only    Past Surgical History:  Procedure Laterality Date   AORTIC ARCH ANGIOGRAPHY N/A 09/11/2016   Procedure: Aortic Arch Angiography;  Surgeon: Carlin FORBES Haddock, MD;  Location: [facility] INVASIVE CV LAB;  Service: Cardiovascular;  Laterality: N/A;   EMBOLECTOMY Left 09/28/2016   Procedure: Left Brachial, radial, ulnar and intraosseous artery thrombectomy;  Surgeon: Carlin FORBES Haddock, MD;  Location: [facility] OR;  Service: Vascular;  Laterality: Left;   HYSTEROSCOPY N/A 12/28/2018   Procedure: HYSTEROSCOPY/D&C;  Surgeon: Rosalva Sawyer, MD;  Location: [facility] OR;  Service: Gynecology;  Laterality: N/A;   I & D EXTREMITY Left 09/28/2016   Procedure: EVACUATION HEMATOMA NECK;  Surgeon: Carlin FORBES Haddock, MD;  Location: [facility] OR;  Service: Vascular;  Laterality: Left;   LAPAROSCOPY Left 12/28/2018   Procedure: Operative Laparoscopy with left oophorectomy;  Surgeon: Rosalva Sawyer, MD;  Location: [facility] OR;  Service: Gynecology;  Laterality: Left;   PATCH ANGIOPLASTY Left 09/28/2016   Procedure: PATCH ANGIOPLASTY BRACHIAL ARTERY USING VIEN HARVESTED FROM PATIENT;  Surgeon: Carlin  FORBES Haddock, MD;  Location: [facility] OR;  Service: Vascular;  Laterality: Left;   PERIPHERAL VASCULAR INTERVENTION Left 09/28/2016   Procedure: LEFT SUBCLAVIAN STENT;  Surgeon: Carlin FORBES Haddock, MD;  Location: [facility] OR;  Service: Vascular;  Laterality: Left;   RIB RESECTION Left 09/28/2016   Procedure: FIRST RIB RESECTION;  Surgeon: Carlin FORBES Haddock, MD;  Location: [facility] OR;  Service: Vascular;  Laterality: Left;    UPPER EXTREMITY ANGIOGRAPHY N/A 09/11/2016   Procedure: Upper Extremity Angiography - Left;  Surgeon: Carlin FORBES Haddock, MD;  Location: [facility] INVASIVE CV LAB;  Service: Cardiovascular;  Laterality: N/A;   UPPER EXTREMITY ANGIOGRAPHY Left 08/23/2019   Procedure: UPPER EXTREMITY ANGIOGRAPHY - Left;  Surgeon: Gretta Lonni PARAS, MD;  Location: [facility] INVASIVE CV LAB;  Service: Cardiovascular;  Laterality: Left;   WISDOM TOOTH EXTRACTION      Family History  Problem Relation Age of Onset   Hypertension Father    Hyperlipidemia Father    Neuropathy Neg Hx     Social History:  reports that she has never smoked. She has never used smokeless tobacco. She reports that she does not currently use alcohol. She reports that she does not use drugs.  Allergies:  Allergies  Allergen Reactions   Other Hives    Per pt excess of any type of CITRUS    Meloxicam Nausea Only    No medications prior to admission.    Review of Systems  Height 5' 3 (1.6 m), weight 63.5 kg. Physical Exam Constitutional:      Appearance: Normal appearance.  HENT:     Head: Normocephalic and atraumatic.  Pulmonary:     Effort: Pulmonary effort is normal.  Abdominal:     Palpations: Abdomen is soft.  Musculoskeletal:        General: Normal range of motion.     Cervical back: Normal range of motion.  Skin:    General: Skin is warm and dry.  Neurological:     Mental Status: She is alert and oriented to person, place, and time.  Psychiatric:        Mood and Affect: Mood normal.        Behavior: Behavior normal.     No results found for this or any previous visit (from the past 24 hours).  No results found.  Assessment/Plan: 38 yo G0 with chronic pelvic pain LAVH, BS, right oophorectomy Patient is informed of risk of bleeding, infection, scarring and damage to surrounding structures.  She is informed of steps of procedure as well as postop expectations and limitations.  She is informed of risk of surgical menopause  including symptoms as well as implications in CV health and bone health.  She strongly desires right oophorectomy even though she understands this is not typical recommendation based upon her age.  Patient understands the possibility of needing to convert to abdominal procedure.  All questions were answered and patient wishes to proceed.  Duwaine Blumenthal 07/15/2023, 1:46 PM

## 2023-07-19 ENCOUNTER — Encounter (HOSPITAL_BASED_OUTPATIENT_CLINIC_OR_DEPARTMENT_OTHER)
Admission: RE | Disposition: A | Discharge status: Home / Self Care | Payer: Self-pay | Source: Home / Self Care | Attending: Obstetrics & Gynecology

## 2023-07-19 ENCOUNTER — Encounter (HOSPITAL_BASED_OUTPATIENT_CLINIC_OR_DEPARTMENT_OTHER): Payer: Self-pay | Admitting: Obstetrics & Gynecology

## 2023-07-19 ENCOUNTER — Observation Stay (HOSPITAL_BASED_OUTPATIENT_CLINIC_OR_DEPARTMENT_OTHER): Payer: BC Managed Care – PPO | Admitting: Anesthesiology

## 2023-07-19 ENCOUNTER — Observation Stay (HOSPITAL_BASED_OUTPATIENT_CLINIC_OR_DEPARTMENT_OTHER)
Admission: RE | Admit: 2023-07-19 | Discharge: 2023-07-20 | Disposition: A | Discharge status: Home / Self Care | Payer: BC Managed Care – PPO | Attending: Obstetrics & Gynecology | Admitting: Obstetrics & Gynecology

## 2023-07-19 ENCOUNTER — Other Ambulatory Visit: Payer: Self-pay

## 2023-07-19 DIAGNOSIS — Z90710 Acquired absence of both cervix and uterus: Secondary | ICD-10-CM | POA: Diagnosis present

## 2023-07-19 DIAGNOSIS — Z01818 Encounter for other preprocedural examination: Secondary | ICD-10-CM

## 2023-07-19 DIAGNOSIS — R102 Pelvic and perineal pain: Secondary | ICD-10-CM | POA: Diagnosis not present

## 2023-07-19 DIAGNOSIS — I10 Essential (primary) hypertension: Secondary | ICD-10-CM | POA: Insufficient documentation

## 2023-07-19 DIAGNOSIS — G8929 Other chronic pain: Principal | ICD-10-CM | POA: Insufficient documentation

## 2023-07-19 DIAGNOSIS — N8003 Adenomyosis of the uterus: Secondary | ICD-10-CM | POA: Diagnosis not present

## 2023-07-19 DIAGNOSIS — D259 Leiomyoma of uterus, unspecified: Secondary | ICD-10-CM | POA: Diagnosis not present

## 2023-07-19 DIAGNOSIS — N8311 Corpus luteum cyst of right ovary: Secondary | ICD-10-CM | POA: Diagnosis not present

## 2023-07-19 DIAGNOSIS — N946 Dysmenorrhea, unspecified: Secondary | ICD-10-CM | POA: Diagnosis not present

## 2023-07-19 HISTORY — DX: Generalized anxiety disorder: F41.1

## 2023-07-19 HISTORY — DX: Presence of spectacles and contact lenses: Z97.3

## 2023-07-19 HISTORY — DX: Attention-deficit hyperactivity disorder, unspecified type: F90.9

## 2023-07-19 HISTORY — DX: Personal history of other mental and behavioral disorders: Z86.59

## 2023-07-19 HISTORY — DX: Essential (primary) hypertension: I10

## 2023-07-19 HISTORY — PX: LAPAROSCOPIC VAGINAL HYSTERECTOMY WITH SALPINGO OOPHORECTOMY: SHX6681

## 2023-07-19 HISTORY — DX: Other chronic pain: G89.29

## 2023-07-19 HISTORY — DX: Presence of dental prosthetic device (complete) (partial): Z97.2

## 2023-07-19 HISTORY — DX: Gastro-esophageal reflux disease without esophagitis: K21.9

## 2023-07-19 HISTORY — DX: Palpitations: R00.2

## 2023-07-19 HISTORY — DX: Unspecified mononeuropathy of left upper limb: G56.92

## 2023-07-19 LAB — TYPE AND SCREEN
ABO/RH(D): O POS
Antibody Screen: NEGATIVE

## 2023-07-19 LAB — POCT PREGNANCY, URINE: Preg Test, Ur: NEGATIVE

## 2023-07-19 SURGERY — HYSTERECTOMY, VAGINAL, LAPAROSCOPY-ASSISTED, WITH SALPINGO-OOPHORECTOMY
Anesthesia: General | Laterality: Bilateral

## 2023-07-19 MED ORDER — AMPHETAMINE-DEXTROAMPHET ER 30 MG PO CP24
30.0000 mg | ORAL_CAPSULE | Freq: Every day | ORAL | Status: DC
Start: 2023-07-19 — End: 2023-07-19

## 2023-07-19 MED ORDER — KETOROLAC TROMETHAMINE 30 MG/ML IJ SOLN
INTRAMUSCULAR | Status: AC
  Filled 2023-07-19: qty 1

## 2023-07-19 MED ORDER — ALPRAZOLAM 0.5 MG PO TABS
0.5000 mg | ORAL_TABLET | Freq: Two times a day (BID) | ORAL | Status: DC | PRN
  Administered 2023-07-19: 1 mg via ORAL

## 2023-07-19 MED ORDER — ONDANSETRON HCL 4 MG/2ML IJ SOLN: 4.0000 mg | Freq: Four times a day (QID) | INTRAMUSCULAR | Status: DC | PRN

## 2023-07-19 MED ORDER — DEXMEDETOMIDINE HCL IN NACL 80 MCG/20ML IV SOLN
INTRAVENOUS | Status: AC
  Filled 2023-07-19: qty 20

## 2023-07-19 MED ORDER — CEFAZOLIN SODIUM-DEXTROSE 2-4 GM/100ML-% IV SOLN
INTRAVENOUS | Status: AC
  Filled 2023-07-19: qty 100

## 2023-07-19 MED ORDER — DEXAMETHASONE SODIUM PHOSPHATE 10 MG/ML IJ SOLN
INTRAMUSCULAR | Status: AC
  Filled 2023-07-19: qty 1

## 2023-07-19 MED ORDER — PROPOFOL 10 MG/ML IV BOLUS
INTRAVENOUS | Status: DC | PRN
  Administered 2023-07-19: 150 mg via INTRAVENOUS

## 2023-07-19 MED ORDER — FENTANYL CITRATE (PF) 100 MCG/2ML IJ SOLN
INTRAMUSCULAR | Status: AC
  Filled 2023-07-19: qty 2

## 2023-07-19 MED ORDER — ACETAMINOPHEN 500 MG PO TABS
ORAL_TABLET | ORAL | Status: AC
  Filled 2023-07-19: qty 2

## 2023-07-19 MED ORDER — DEXMEDETOMIDINE HCL IN NACL 80 MCG/20ML IV SOLN
INTRAVENOUS | Status: DC | PRN
  Administered 2023-07-19: 12 ug via INTRAVENOUS

## 2023-07-19 MED ORDER — DOCUSATE SODIUM 100 MG PO CAPS
100.0000 mg | ORAL_CAPSULE | Freq: Two times a day (BID) | ORAL | Status: DC
  Administered 2023-07-19 (×2): 100 mg via ORAL

## 2023-07-19 MED ORDER — DEXAMETHASONE SODIUM PHOSPHATE 10 MG/ML IJ SOLN
INTRAMUSCULAR | Status: DC | PRN
  Administered 2023-07-19: 10 mg via INTRAVENOUS

## 2023-07-19 MED ORDER — DOCUSATE SODIUM 100 MG PO CAPS
ORAL_CAPSULE | ORAL | Status: AC
  Filled 2023-07-19: qty 1

## 2023-07-19 MED ORDER — OXYCODONE HCL 5 MG PO TABS
ORAL_TABLET | ORAL | Status: AC
  Filled 2023-07-19: qty 1

## 2023-07-19 MED ORDER — OXYCODONE HCL 5 MG PO TABS
5.0000 mg | ORAL_TABLET | Freq: Once | ORAL | Status: DC | PRN
Start: 2023-07-19 — End: 2023-07-19

## 2023-07-19 MED ORDER — MENTHOL 3 MG MT LOZG: 1.0000 | LOZENGE | OROMUCOSAL | Status: DC | PRN

## 2023-07-19 MED ORDER — LIDOCAINE 2% (20 MG/ML) 5 ML SYRINGE
INTRAMUSCULAR | Status: DC | PRN
  Administered 2023-07-19: 60 mg via INTRAVENOUS

## 2023-07-19 MED ORDER — OXYCODONE HCL 5 MG PO TABS
ORAL_TABLET | ORAL | Status: AC
  Filled 2023-07-19: qty 2

## 2023-07-19 MED ORDER — MIDAZOLAM HCL 5 MG/5ML IJ SOLN
INTRAMUSCULAR | Status: DC | PRN
  Administered 2023-07-19: 2 mg via INTRAVENOUS

## 2023-07-19 MED ORDER — ROCURONIUM BROMIDE 10 MG/ML (PF) SYRINGE
PREFILLED_SYRINGE | INTRAVENOUS | Status: DC | PRN
  Administered 2023-07-19: 50 mg via INTRAVENOUS
  Administered 2023-07-19: 10 mg via INTRAVENOUS

## 2023-07-19 MED ORDER — CEFAZOLIN SODIUM-DEXTROSE 2-4 GM/100ML-% IV SOLN
2.0000 g | INTRAVENOUS | Status: AC
  Administered 2023-07-19: 2 g via INTRAVENOUS

## 2023-07-19 MED ORDER — ARTIFICIAL TEARS OPHTHALMIC OINT
TOPICAL_OINTMENT | OPHTHALMIC | Status: AC
  Filled 2023-07-19: qty 3.5

## 2023-07-19 MED ORDER — ONDANSETRON HCL 4 MG PO TABS: 4.0000 mg | ORAL_TABLET | Freq: Four times a day (QID) | ORAL | Status: DC | PRN

## 2023-07-19 MED ORDER — ACETAMINOPHEN 500 MG PO TABS
1000.0000 mg | ORAL_TABLET | Freq: Four times a day (QID) | ORAL | Status: DC
  Administered 2023-07-19 – 2023-07-20 (×4): 1000 mg via ORAL

## 2023-07-19 MED ORDER — OXYCODONE HCL 5 MG PO TABS
5.0000 mg | ORAL_TABLET | ORAL | Status: DC | PRN
  Administered 2023-07-19: 5 mg via ORAL
  Administered 2023-07-19: 10 mg via ORAL
  Administered 2023-07-19: 5 mg via ORAL
  Administered 2023-07-19 – 2023-07-20 (×4): 10 mg via ORAL

## 2023-07-19 MED ORDER — OXYCODONE HCL 5 MG/5ML PO SOLN: 5.0000 mg | Freq: Once | ORAL | Status: DC | PRN

## 2023-07-19 MED ORDER — KETOROLAC TROMETHAMINE 30 MG/ML IJ SOLN
INTRAMUSCULAR | Status: DC | PRN
  Administered 2023-07-19: 30 mg via INTRAVENOUS

## 2023-07-19 MED ORDER — HYDROMORPHONE HCL 2 MG/ML IJ SOLN
INTRAMUSCULAR | Status: AC
  Filled 2023-07-19: qty 1

## 2023-07-19 MED ORDER — POVIDONE-IODINE 10 % EX SWAB: 2.0000 | Freq: Once | CUTANEOUS | Status: DC

## 2023-07-19 MED ORDER — SUGAMMADEX SODIUM 200 MG/2ML IV SOLN
INTRAVENOUS | Status: DC | PRN
  Administered 2023-07-19: 200 mg via INTRAVENOUS

## 2023-07-19 MED ORDER — ONDANSETRON HCL 4 MG/2ML IJ SOLN
INTRAMUSCULAR | Status: DC | PRN
  Administered 2023-07-19: 4 mg via INTRAVENOUS

## 2023-07-19 MED ORDER — KETOROLAC TROMETHAMINE 30 MG/ML IJ SOLN
INTRAMUSCULAR | Status: AC
Start: 2023-07-19 — End: ?
  Filled 2023-07-19: qty 1

## 2023-07-19 MED ORDER — LACTATED RINGERS IV SOLN: INTRAVENOUS | Status: DC

## 2023-07-19 MED ORDER — HYDROMORPHONE HCL 1 MG/ML IJ SOLN
INTRAMUSCULAR | Status: DC | PRN
  Administered 2023-07-19: .5 mg via INTRAVENOUS

## 2023-07-19 MED ORDER — FENTANYL CITRATE (PF) 100 MCG/2ML IJ SOLN
INTRAMUSCULAR | Status: DC | PRN
  Administered 2023-07-19: 100 ug via INTRAVENOUS
  Administered 2023-07-19 (×2): 50 ug via INTRAVENOUS

## 2023-07-19 MED ORDER — METOPROLOL SUCCINATE ER 50 MG PO TB24
50.0000 mg | ORAL_TABLET | Freq: Every day | ORAL | Status: DC
  Filled 2023-07-19: qty 1

## 2023-07-19 MED ORDER — PROPOFOL 10 MG/ML IV BOLUS
INTRAVENOUS | Status: AC
  Filled 2023-07-19: qty 20

## 2023-07-19 MED ORDER — KETOROLAC TROMETHAMINE 30 MG/ML IJ SOLN
30.0000 mg | Freq: Four times a day (QID) | INTRAMUSCULAR | Status: DC
  Administered 2023-07-19 – 2023-07-20 (×3): 30 mg via INTRAVENOUS

## 2023-07-19 MED ORDER — ALPRAZOLAM 0.5 MG PO TABS
ORAL_TABLET | ORAL | Status: AC
  Filled 2023-07-19: qty 2

## 2023-07-19 MED ORDER — FENTANYL CITRATE (PF) 100 MCG/2ML IJ SOLN: 25.0000 ug | INTRAMUSCULAR | Status: DC | PRN

## 2023-07-19 MED ORDER — HYDROMORPHONE HCL 1 MG/ML IJ SOLN: 0.2000 mg | INTRAMUSCULAR | Status: DC | PRN

## 2023-07-19 MED ORDER — DOCUSATE SODIUM 100 MG PO CAPS
ORAL_CAPSULE | ORAL | Status: AC
Start: 2023-07-19 — End: ?
  Filled 2023-07-19: qty 1

## 2023-07-19 MED ORDER — ROCURONIUM BROMIDE 10 MG/ML (PF) SYRINGE
PREFILLED_SYRINGE | INTRAVENOUS | Status: AC
Start: 2023-07-19 — End: ?
  Filled 2023-07-19: qty 10

## 2023-07-19 MED ORDER — METOPROLOL SUCCINATE ER 25 MG PO TB24
25.0000 mg | ORAL_TABLET | Freq: Every day | ORAL | Status: DC
  Administered 2023-07-19: 25 mg via ORAL
  Filled 2023-07-19: qty 1

## 2023-07-19 MED ORDER — LIDOCAINE HCL (PF) 2 % IJ SOLN
INTRAMUSCULAR | Status: AC
Start: 2023-07-19 — End: ?
  Filled 2023-07-19: qty 5

## 2023-07-19 MED ORDER — SIMETHICONE 80 MG PO CHEW: 80.0000 mg | CHEWABLE_TABLET | Freq: Four times a day (QID) | ORAL | Status: DC | PRN

## 2023-07-19 MED ORDER — BUPIVACAINE HCL (PF) 0.25 % IJ SOLN
INTRAMUSCULAR | Status: DC | PRN
  Administered 2023-07-19: 6 mL

## 2023-07-19 MED ORDER — MIDAZOLAM HCL 2 MG/2ML IJ SOLN
INTRAMUSCULAR | Status: AC
Start: 2023-07-19 — End: ?
  Filled 2023-07-19: qty 2

## 2023-07-19 MED ORDER — ONDANSETRON HCL 4 MG/2ML IJ SOLN
INTRAMUSCULAR | Status: AC
Start: 2023-07-19 — End: ?
  Filled 2023-07-19: qty 2

## 2023-07-19 MED ORDER — SODIUM CHLORIDE 0.9 % IR SOLN
Status: DC | PRN
  Administered 2023-07-19: 1000 mL

## 2023-07-19 MED ORDER — AMPHETAMINE-DEXTROAMPHETAMINE 30 MG PO TABS: 30.0000 mg | ORAL_TABLET | Freq: Every day | ORAL | Status: DC

## 2023-07-19 SURGICAL SUPPLY — 55 items
BLADE CLIPPER SENSICLIP SURGIC (BLADE) IMPLANT
CATH ROBINSON RED A/P 16FR (CATHETERS) ×1 IMPLANT
COVER BACK TABLE 60X90IN (DRAPES) ×1 IMPLANT
COVER MAYO STAND STRL (DRAPES) ×2 IMPLANT
DERMABOND ADVANCED .7 DNX12 (GAUZE/BANDAGES/DRESSINGS) IMPLANT
DILATOR CANAL MILEX (MISCELLANEOUS) IMPLANT
DRAPE SURG IRRIG POUCH 19X23 (DRAPES) ×1 IMPLANT
DRSG COVADERM PLUS 2X2 (GAUZE/BANDAGES/DRESSINGS) ×1 IMPLANT
DRSG OPSITE POSTOP 3X4 (GAUZE/BANDAGES/DRESSINGS) ×1 IMPLANT
DURAPREP 26ML APPLICATOR (WOUND CARE) ×1 IMPLANT
ELECT REM PT RETURN 9FT ADLT (ELECTROSURGICAL)
ELECTRODE REM PT RTRN 9FT ADLT (ELECTROSURGICAL) IMPLANT
FORCEPS CUTTING 45CM 5MM (CUTTING FORCEPS) IMPLANT
GAUZE 4X4 16PLY ~~LOC~~+RFID DBL (SPONGE) ×1 IMPLANT
GLOVE BIO SURGEON STRL SZ 6 (GLOVE) ×3 IMPLANT
GLOVE BIOGEL PI IND STRL 6 (GLOVE) ×2 IMPLANT
GLOVE ECLIPSE 6.0 STRL STRAW (GLOVE) ×3 IMPLANT
GLOVE ECLIPSE 6.5 STRL STRAW (GLOVE) ×2 IMPLANT
GLOVE SS PI 5.5 STRL (GLOVE) ×2 IMPLANT
HOLDER FOLEY CATH W/STRAP (MISCELLANEOUS) ×1 IMPLANT
IRRIGATION STRYKERFLOW (MISCELLANEOUS) IMPLANT
IRRIGATOR STRYKERFLOW (MISCELLANEOUS)
IV NS 1000ML BAXH (IV SOLUTION) IMPLANT
IV NS IRRIG 3000ML ARTHROMATIC (IV SOLUTION) IMPLANT
KIT PINK PAD W/HEAD ARE REST (MISCELLANEOUS) ×2
KIT PINK PAD W/HEAD ARM REST (MISCELLANEOUS) ×2 IMPLANT
KIT TURNOVER CYSTO (KITS) ×1 IMPLANT
LIGASURE BLUNT TIP 5 LONG 44CM (ELECTROSURGICAL) IMPLANT
LIGASURE IMPACT 36 18CM CVD LR (INSTRUMENTS) IMPLANT
LIGASURE LAP L-HOOKWIRE 5 44CM (INSTRUMENTS) IMPLANT
MANIPULATOR UTERINE 4.5 ZUMI (MISCELLANEOUS) IMPLANT
NDL INSUFFLATION 14GA 120MM (NEEDLE) ×1 IMPLANT
NEEDLE INSUFFLATION 14GA 120MM (NEEDLE) ×1 IMPLANT
NS IRRIG 500ML POUR BTL (IV SOLUTION) ×1 IMPLANT
PACK LAVH (CUSTOM PROCEDURE TRAY) ×1 IMPLANT
PACK ROBOTIC GOWN (GOWN DISPOSABLE) ×1 IMPLANT
PAD OB MATERNITY 4.3X12.25 (PERSONAL CARE ITEMS) ×1 IMPLANT
PAD PREP 24X48 CUFFED NSTRL (MISCELLANEOUS) ×1 IMPLANT
SCISSORS LAP 5X35 DISP (ENDOMECHANICALS) IMPLANT
SET TUBE SMOKE EVAC HIGH FLOW (TUBING) ×1 IMPLANT
SLEEVE SCD COMPRESS KNEE MED (STOCKING) ×1 IMPLANT
SPIKE FLUID TRANSFER (MISCELLANEOUS) IMPLANT
SUT MNCRL 0 MO-4 VIOLET 18 CR (SUTURE) ×2 IMPLANT
SUT MNCRL AB 3-0 PS2 18 (SUTURE) ×1 IMPLANT
SUT MON AB 2-0 CT1 36 (SUTURE) IMPLANT
SUT VIC AB 0 CT1 27XCR 8 STRN (SUTURE) IMPLANT
SUT VICRYL 0 TIES 12 18 (SUTURE) ×1 IMPLANT
SUT VICRYL 0 UR6 27IN ABS (SUTURE) ×1 IMPLANT
SYR BULB IRRIG 60ML STRL (SYRINGE) IMPLANT
TOWEL OR 17X24 6PK STRL BLUE (TOWEL DISPOSABLE) ×1 IMPLANT
TRAY FOLEY W/BAG SLVR 14FR LF (SET/KITS/TRAYS/PACK) ×1 IMPLANT
TROCAR Z-THREAD FIOS 11X100 BL (TROCAR) ×1 IMPLANT
TROCAR Z-THREAD FIOS 5X100MM (TROCAR) ×1 IMPLANT
WARMER LAPAROSCOPE (MISCELLANEOUS) ×1 IMPLANT
WATER STERILE IRR 500ML POUR (IV SOLUTION) IMPLANT

## 2023-07-19 NOTE — Op Note (Signed)
 PROCEDURE DATE: 07/19/2023 PREOPERATIVE DIAGNOSIS: Chronic pelvic pain POSTOPERATIVE DIAGNOSIS: The same  PROCEDURE: Laparoscopic Assisted Vaginal Hysterectomy, Bilateral Salpingectomy, Right oophorectomy  SURGEON: Dr. Duwaine Blumenthal  ASSISTANT: Dr. Truman Corona  INDICATIONS: 38 y.o. G0 with chronic pelvic pain and dysmenorrhea desiring definitive surgical management. Risks of surgery were discussed with the patient including but not limited to: bleeding which may require transfusion or reoperation; infection which may require antibiotics; injury to bowel, bladder, ureters or other surrounding organs; need for additional procedures including laparotomy; thromboembolic phenomenon, incisional problems and other postoperative/anesthesia complications. Written informed consent was obtained.  FINDINGS: Small uterus with multiple small subserosal fibroids, normal bilateral fallopian tubes, surgically absent left ovary and normal right ovary. No evidence of endometriosis. Normal upper abdomen. Normal appearing appendix. ANESTHESIA: General  ESTIMATED BLOOD LOSS: 100 ml  SPECIMENS: Uterus, cervix, bilateral fallopian tubes and right ovary  COMPLICATIONS: None immediate  PROCEDURE IN DETAIL: The patient received intravenous antibiotics and had sequential compression devices applied to her lower extremities while in the preoperative area. She was then taken to the operating room where general anesthesia was administered and was found to be adequate. She was placed in the dorsal lithotomy position, and was prepped and draped in a sterile manner. An in and out catheterization was performed. A uterine manipulator was then advanced into the uterus . After an adequate timeout was performed, attention was then turned to the patient's abdomen where a 10-mm skin incision was made in the umbilical fold. The Veress needle was carefully introduced into the peritoneal cavity through the abdominal wall. Intraperitoneal  placement was confirmed by drop in intraabdominal pressure with insufflation of carbon dioxide gas. Adequate pneumoperitoneum was obtained, and the 10/11 XL trocar and sleeve were then advanced without difficulty into the abdomen where intraabdominal placement was confirmed by the laparoscope. A survey of the patient's pelvis and abdomen revealed the above dictated findings.  Suprapubic 5 XL port was then placed under direct visualization. The pelvis was then carefully examined. On the right side, the fallopian tube was elevated and freed from the mesosalpinx using Ligasure.  The right IP was clamped, coagulated and cut to free it from the pelvic sidewall.  The round ligament was then clamped and transected with the Ligasure. The uteroovarian ligament was also clamped and transected. The leaves of the broad ligament were separated and serially transected. These procedures were then repeated on the left side with exception of the oophorectomy as this was surgically absent.  The ureters were noted to be safely away from the area of dissection.   At this point, attention was turned to the vaginal portion of the case. A weighted speculum was placed posteriorly, a Deaver anteriorly, and the cervix grasped with a thyroid  tenaculum. Once the anterior and posterior reflections were identified, the cervix was circumscribed using the Bovie knife. Next, using Mayos, the posterior cul-de-sac was entered. The uterosacrals were clamped, cut and suture ligated and tagged. Next, the bladder reflection was identified. Using Metzenbaums, it was entered and palpation and direct visualization confirmed proper location. Next, using the LigaSure, the uterine arteries were coapted and cut bilaterally. The pedicles were visualized after coaptation and were hemostatic. The same was performed sequentially cephalad until the specimen was removed. The pedicles were inspected and found to be hemostatic.  Next, the uterosacrals were tied  together in midline. The remainder of the cuff was closed using 0 monocryl figure-of-8 stitches. The cuff was inspected and found to be hemostatic.  The foley catheter was  inserted and drained clear yellow urine.   Attention was returned to the abdomen were a second laparoscopic look was taken. All pedicles were hemostatic.  Insufflation was removed after all instruments were removed.   The infraumbilical fascial incision was closed using 0 vicryl in a figure of eight stitch.  Both skin incisions were closed with 4-0 monocryl subcuticular stitches and Dermabond. The patient tolerated the procedures well. All instruments, needles, and sponge counts were correct x 2. The patient was taken to the recovery room awake, extubated and in stable condition.

## 2023-07-19 NOTE — Anesthesia Procedure Notes (Signed)
 Procedure Name: Intubation Date/Time: 07/19/2023 7:36 AM  Performed by: Franchot Delon RAMAN, CRNAPre-anesthesia Checklist: Patient identified, Emergency Drugs available, Suction available and Patient being monitored Patient Re-evaluated:Patient Re-evaluated prior to induction Oxygen Delivery Method: Circle System Utilized Preoxygenation: Pre-oxygenation with 100% oxygen Induction Type: IV induction Ventilation: Mask ventilation without difficulty Laryngoscope Size: Mac and 3 Grade View: Grade I Tube type: Oral Tube size: 7.0 mm Number of attempts: 1 Airway Equipment and Method: Stylet Placement Confirmation: ETT inserted through vocal cords under direct vision, positive ETCO2 and breath sounds checked- equal and bilateral Secured at: 22 cm Tube secured with: Tape Dental Injury: Teeth and Oropharynx as per pre-operative assessment

## 2023-07-19 NOTE — Progress Notes (Signed)
 NOS  Patient reports no c/o.  Mild pain well controlled by meds.  No N/V.  Tolerating full diet.  Ambulating well.  Foley out and patient is voiding.       07/19/2023   12:30 PM 07/19/2023   11:45 AM 07/19/2023   11:00 AM  Vitals with BMI  Systolic 128 124 881  Diastolic 86 77 67  Pulse 97 81 83   Gen: A&O x 3 Abd: soft, ND.  Inc c/d/I x 2 Ext: no c/c/e  POD#0 s/p LAVH -Recovering well -AM labs to be drawn -Continue pain management -Likely d/c home in am  Duwaine Blumenthal, DO

## 2023-07-19 NOTE — Anesthesia Preprocedure Evaluation (Signed)
 Anesthesia Evaluation  Patient identified by MRN, date of birth, ID band Patient awake    Reviewed: Allergy & Precautions, H&P , NPO status , Patient's Chart, lab work & pertinent test results  Airway Mallampati: II   Neck ROM: full    Dental   Pulmonary neg pulmonary ROS   breath sounds clear to auscultation       Cardiovascular hypertension, + Peripheral Vascular Disease   Rhythm:regular Rate:Normal  H/o thoracic outlet syndrome left side.  S/p decompression left side.   Neuro/Psych  PSYCHIATRIC DISORDERS Anxiety Depression     Neuromuscular disease    GI/Hepatic ,GERD  ,,  Endo/Other    Renal/GU      Musculoskeletal   Abdominal   Peds  Hematology   Anesthesia Other Findings   Reproductive/Obstetrics                             Anesthesia Physical Anesthesia Plan  ASA: 2  Anesthesia Plan: General   Post-op Pain Management:    Induction: Intravenous  PONV Risk Score and Plan: 3 and Ondansetron , Dexamethasone , Midazolam  and Treatment may vary due to age or medical condition  Airway Management Planned: Oral ETT  Additional Equipment:   Intra-op Plan:   Post-operative Plan: Extubation in OR  Informed Consent: I have reviewed the patients History and Physical, chart, labs and discussed the procedure including the risks, benefits and alternatives for the proposed anesthesia with the patient or authorized representative who has indicated his/her understanding and acceptance.     Dental advisory given  Plan Discussed with: CRNA, Anesthesiologist and Surgeon  Anesthesia Plan Comments:        Anesthesia Quick Evaluation

## 2023-07-19 NOTE — Progress Notes (Signed)
 No change in H&P.  Mitchel Honour, DO

## 2023-07-19 NOTE — Anesthesia Postprocedure Evaluation (Signed)
 Anesthesia Post Note  Patient: Michelle Villa  Procedure(s) Performed: LAPAROSCOPIC ASSISTED VAGINAL HYSTERECTOMY WITH BILATERAL SALPINGECTOMY, RIGHT OOPHORECTOMY (Bilateral)     Patient location during evaluation: PACU Anesthesia Type: General Level of consciousness: awake and alert Pain management: pain level controlled Vital Signs Assessment: post-procedure vital signs reviewed and stable Respiratory status: spontaneous breathing, nonlabored ventilation, respiratory function stable and patient connected to nasal cannula oxygen Cardiovascular status: blood pressure returned to baseline and stable Postop Assessment: no apparent nausea or vomiting Anesthetic complications: no   No notable events documented.  Last Vitals:  Vitals:   07/19/23 1145 07/19/23 1230  BP: 124/77 128/86  Pulse: 81 97  Resp: 16 16  Temp: (!) 36.4 C   SpO2: 98% 98%    Last Pain:  Vitals:   07/19/23 1145  TempSrc:   PainSc: 4                  [name]

## 2023-07-19 NOTE — Transfer of Care (Signed)
 Immediate Anesthesia Transfer of Care Note  Patient: Michelle Villa  Procedure(s) Performed: LAPAROSCOPIC ASSISTED VAGINAL HYSTERECTOMY WITH BILATERAL SALPINGECTOMY, RIGHT OOPHORECTOMY (Bilateral)  Patient Location: PACU  Anesthesia Type:General  Level of Consciousness: awake, alert , oriented, and patient cooperative  Airway & Oxygen Therapy: Patient Spontanous Breathing  Post-op Assessment: Report given to RN and Post -op Vital signs reviewed and stable  Post vital signs: Reviewed and stable  Last Vitals:  Vitals Value Taken Time  BP 125/75 07/19/23 0948  Temp    Pulse 101 07/19/23 0951  Resp 17 07/19/23 0951  SpO2 95 % 07/19/23 0951  Vitals shown include unfiled device data.  Last Pain:  Vitals:   07/19/23 0651  TempSrc: Oral  PainSc: 8       Patients Stated Pain Goal: 5 (07/19/23 9348)  Complications: No notable events documented.

## 2023-07-20 ENCOUNTER — Encounter (HOSPITAL_BASED_OUTPATIENT_CLINIC_OR_DEPARTMENT_OTHER): Payer: Self-pay | Admitting: Obstetrics & Gynecology

## 2023-07-20 DIAGNOSIS — G8929 Other chronic pain: Secondary | ICD-10-CM | POA: Diagnosis not present

## 2023-07-20 DIAGNOSIS — R102 Pelvic and perineal pain: Secondary | ICD-10-CM | POA: Diagnosis not present

## 2023-07-20 DIAGNOSIS — I10 Essential (primary) hypertension: Secondary | ICD-10-CM | POA: Diagnosis not present

## 2023-07-20 LAB — CBC
HCT: 34.7 % — ABNORMAL LOW (ref 36.0–46.0)
Hemoglobin: 12.1 g/dL (ref 12.0–15.0)
MCH: 35.9 pg — ABNORMAL HIGH (ref 26.0–34.0)
MCHC: 34.9 g/dL (ref 30.0–36.0)
MCV: 103 fL — ABNORMAL HIGH (ref 80.0–100.0)
Platelets: 167 10*3/uL (ref 150–400)
RBC: 3.37 MIL/uL — ABNORMAL LOW (ref 3.87–5.11)
RDW: 12.1 % (ref 11.5–15.5)
WBC: 11.2 10*3/uL — ABNORMAL HIGH (ref 4.0–10.5)
nRBC: 0 % (ref 0.0–0.2)

## 2023-07-20 LAB — COMPREHENSIVE METABOLIC PANEL
ALT: 33 U/L (ref 0–44)
AST: 43 U/L — ABNORMAL HIGH (ref 15–41)
Albumin: 3.5 g/dL (ref 3.5–5.0)
Alkaline Phosphatase: 50 U/L (ref 38–126)
Anion gap: 10 (ref 5–15)
BUN: 7 mg/dL (ref 6–20)
CO2: 19 mmol/L — ABNORMAL LOW (ref 22–32)
Calcium: 8.2 mg/dL — ABNORMAL LOW (ref 8.9–10.3)
Chloride: 104 mmol/L (ref 98–111)
Creatinine, Ser: 0.49 mg/dL (ref 0.44–1.00)
GFR, Estimated: >60 mL/min (ref >60)
Glucose, Bld: 134 mg/dL — ABNORMAL HIGH (ref 70–99)
Potassium: 3.9 mmol/L (ref 3.5–5.1)
Sodium: 133 mmol/L — ABNORMAL LOW (ref 135–145)
Total Bilirubin: 0.8 mg/dL (ref 0.0–1.2)
Total Protein: 6.2 g/dL — ABNORMAL LOW (ref 6.5–8.1)

## 2023-07-20 LAB — SURGICAL PATHOLOGY

## 2023-07-20 MED ORDER — OXYCODONE-ACETAMINOPHEN 5-325 MG PO TABS: 1.0000 | ORAL_TABLET | ORAL | 0 refills | Status: AC | PRN

## 2023-07-20 MED ORDER — KETOROLAC TROMETHAMINE 30 MG/ML IJ SOLN
INTRAMUSCULAR | Status: AC
Start: 2023-07-20 — End: ?
  Filled 2023-07-20: qty 1

## 2023-07-20 MED ORDER — ACETAMINOPHEN 500 MG PO TABS
ORAL_TABLET | ORAL | Status: AC
  Filled 2023-07-20: qty 2

## 2023-07-20 MED ORDER — OXYCODONE HCL 5 MG PO TABS
ORAL_TABLET | ORAL | Status: AC
  Filled 2023-07-20: qty 2

## 2023-07-20 MED ORDER — IBUPROFEN 600 MG PO TABS: 600.0000 mg | ORAL_TABLET | Freq: Four times a day (QID) | ORAL | 0 refills | Status: AC | PRN

## 2023-07-20 MED ORDER — ACETAMINOPHEN 500 MG PO TABS
ORAL_TABLET | ORAL | Status: AC
Start: 2023-07-20 — End: ?
  Filled 2023-07-20: qty 2

## 2023-07-20 NOTE — Discharge Instructions (Signed)
 Call MD for T>100.4, heavy vaginal bleeding, severe abdominal pain, or intractable nausea and/or vomiting.  Call office to schedule postop appointment in 2 weeks.  Pelvic rest and no heavy lifting x 6 weeks.  No driving while taking narcotics.

## 2023-07-20 NOTE — Discharge Summary (Signed)
 Physician Discharge Summary  Patient ID: Michelle Villa MRN: [id number] DOB/AGE: July 13, 1986 38 y.o.  Admit date: 07/19/2023 Discharge date: 07/20/2023  Admission Diagnoses: Chronic pelvic pain  Discharge Diagnoses:  Principal Problem:   Chronic pelvic pain in female Active Problems:   S/P laparoscopic assisted vaginal hysterectomy (LAVH)   Discharged Condition: good  Hospital Course: Patient was admitted for anticipated LAVH, BS, right oophorectomy.  This procedure was performed without complication.  Please see operative note for details.  Patient did well postoperatively and on POD#1 was meeting all goals.  She was discharged home with planned outpatient f/u in 2 weeks.  Consults: None  Significant Diagnostic Studies: none  Treatments: surgery: LAVH, BS, right oophorectomy  Discharge Exam: Blood pressure 105/64, pulse 76, temperature 98.2 F (36.8 C), temperature source Oral, resp. rate 18, height 5' 3 (1.6 m), weight 66.7 kg, SpO2 97%. General appearance: alert, cooperative, and appears stated age Extremities: extremities normal, atraumatic, no cyanosis or edema Abd: soft, ND.  Inc c/d/I x 2  Disposition: Discharge disposition: 01-Home or Self Care       Discharge Instructions     Discharge patient   Complete by: As directed    Discharge disposition: 01-Home or Self Care   Discharge patient date: 07/20/2023      Allergies as of 07/20/2023       Reactions   Other Hives   Per pt excess of any type of CITRUS    Meloxicam Nausea Only        Medication List     TAKE these medications    ALPRAZolam  1 MG tablet Commonly known as: XANAX  Take 0.5-1 mg by mouth 2 (two) times daily as needed for anxiety.   amphetamine -dextroamphetamine  30 MG 24 hr capsule Commonly known as: ADDERALL XR Take 30 mg by mouth daily.   amphetamine -dextroamphetamine  30 MG tablet Commonly known as: ADDERALL Take 30 mg by mouth daily after lunch.   B-12 1000 MCG Caps Take 1  capsule by mouth daily.   Biotin 5000 MCG Caps Take 1 capsule by mouth daily.   diphenhydrAMINE  25 MG tablet Commonly known as: BENADRYL  Take 25 mg by mouth every 6 (six) hours as needed for itching.   hydroxypropyl methylcellulose / hypromellose 2.5 % ophthalmic solution Commonly known as: ISOPTO TEARS / GONIOVISC Place 1 drop into both eyes 3 (three) times daily as needed for dry eyes.   ibuprofen  600 MG tablet Commonly known as: ADVIL  Take 1 tablet (600 mg total) by mouth every 6 (six) hours as needed.   metoprolol  succinate 50 MG 24 hr tablet Commonly known as: TOPROL -XL TAKE 1 TABLET BY MOUTH DAILY WITH OR IMMEDIATELY FOLLOWING A MEAL   multivitamin capsule Take 1 capsule by mouth daily.   oxyCODONE -acetaminophen  5-325 MG tablet Commonly known as: Percocet Take 1 tablet by mouth every 4 (four) hours as needed for up to 7 days for severe pain (pain score 7-10).         Signed: Duwaine Blumenthal 07/20/2023, 7:18 AM

## 2023-07-20 NOTE — Progress Notes (Signed)
 1 Day Post-Op Procedure(s) (LRB): LAPAROSCOPIC ASSISTED VAGINAL HYSTERECTOMY WITH BILATERAL SALPINGECTOMY, RIGHT OOPHORECTOMY (Bilateral)  Subjective: Patient reports tolerating PO and no problems voiding.  Pain well controlled with po meds.  Ambulating well.  Objective: I have reviewed patient's vital signs, intake and output, medications, and labs.  General: alert, cooperative, and appears stated age Extremities: extremities normal, atraumatic, no cyanosis or edema Abd: soft, ND.  Inc c/d/I x 2  Assessment: s/p Procedure(s): LAPAROSCOPIC ASSISTED VAGINAL HYSTERECTOMY WITH BILATERAL SALPINGECTOMY, RIGHT OOPHORECTOMY (Bilateral): stable, progressing well, and tolerating diet  Plan: Discharge home  LOS: 0 days    Duwaine Blumenthal, DO 07/20/2023, 7:13 AM

## 2023-08-16 DIAGNOSIS — F902 Attention-deficit hyperactivity disorder, combined type: Secondary | ICD-10-CM | POA: Diagnosis not present

## 2023-08-16 DIAGNOSIS — R232 Flushing: Secondary | ICD-10-CM | POA: Diagnosis not present

## 2023-08-16 DIAGNOSIS — R000 Tachycardia, unspecified: Secondary | ICD-10-CM | POA: Diagnosis not present

## 2023-08-16 DIAGNOSIS — G540 Brachial plexus disorders: Secondary | ICD-10-CM | POA: Diagnosis not present

## 2023-08-16 DIAGNOSIS — Z0000 Encounter for general adult medical examination without abnormal findings: Secondary | ICD-10-CM | POA: Diagnosis not present

## 2023-08-16 DIAGNOSIS — E039 Hypothyroidism, unspecified: Secondary | ICD-10-CM | POA: Diagnosis not present

## 2023-08-16 DIAGNOSIS — Z23 Encounter for immunization: Secondary | ICD-10-CM | POA: Diagnosis not present

## 2023-08-20 DIAGNOSIS — E042 Nontoxic multinodular goiter: Secondary | ICD-10-CM | POA: Diagnosis not present

## 2023-08-20 DIAGNOSIS — E049 Nontoxic goiter, unspecified: Secondary | ICD-10-CM | POA: Diagnosis not present

## 2023-09-10 ENCOUNTER — Other Ambulatory Visit: Payer: Self-pay | Admitting: Medical Genetics

## 2023-09-21 ENCOUNTER — Other Ambulatory Visit (HOSPITAL_COMMUNITY)
Admission: RE | Admit: 2023-09-21 | Discharge: 2023-09-21 | Disposition: A | Discharge status: Home / Self Care | Payer: Self-pay | Source: Ambulatory Visit | Attending: Medical Genetics | Admitting: Medical Genetics

## 2023-09-27 DIAGNOSIS — F411 Generalized anxiety disorder: Secondary | ICD-10-CM | POA: Diagnosis not present

## 2023-09-27 DIAGNOSIS — F331 Major depressive disorder, recurrent, moderate: Secondary | ICD-10-CM | POA: Diagnosis not present

## 2023-10-01 LAB — GENECONNECT MOLECULAR SCREEN: Genetic Analysis Overall Interpretation: NEGATIVE

## 2023-10-12 DIAGNOSIS — B078 Other viral warts: Secondary | ICD-10-CM | POA: Diagnosis not present

## 2023-11-04 DIAGNOSIS — F331 Major depressive disorder, recurrent, moderate: Secondary | ICD-10-CM | POA: Diagnosis not present

## 2023-11-19 DIAGNOSIS — F411 Generalized anxiety disorder: Secondary | ICD-10-CM | POA: Diagnosis not present

## 2023-11-19 DIAGNOSIS — F418 Other specified anxiety disorders: Secondary | ICD-10-CM | POA: Diagnosis not present

## 2023-11-19 DIAGNOSIS — F902 Attention-deficit hyperactivity disorder, combined type: Secondary | ICD-10-CM | POA: Diagnosis not present

## 2023-11-19 DIAGNOSIS — G894 Chronic pain syndrome: Secondary | ICD-10-CM | POA: Diagnosis not present

## 2023-12-08 DIAGNOSIS — G8929 Other chronic pain: Secondary | ICD-10-CM | POA: Diagnosis not present

## 2023-12-08 DIAGNOSIS — M25561 Pain in right knee: Secondary | ICD-10-CM | POA: Diagnosis not present

## 2023-12-08 DIAGNOSIS — M25551 Pain in right hip: Secondary | ICD-10-CM | POA: Diagnosis not present

## 2023-12-15 DIAGNOSIS — M25561 Pain in right knee: Secondary | ICD-10-CM | POA: Diagnosis not present

## 2023-12-15 DIAGNOSIS — M25551 Pain in right hip: Secondary | ICD-10-CM | POA: Diagnosis not present

## 2023-12-21 DIAGNOSIS — M25561 Pain in right knee: Secondary | ICD-10-CM | POA: Diagnosis not present

## 2023-12-21 DIAGNOSIS — M25551 Pain in right hip: Secondary | ICD-10-CM | POA: Diagnosis not present

## 2023-12-28 DIAGNOSIS — M25551 Pain in right hip: Secondary | ICD-10-CM | POA: Diagnosis not present

## 2023-12-28 DIAGNOSIS — M25561 Pain in right knee: Secondary | ICD-10-CM | POA: Diagnosis not present

## 2023-12-30 DIAGNOSIS — M25561 Pain in right knee: Secondary | ICD-10-CM | POA: Diagnosis not present

## 2023-12-30 DIAGNOSIS — M25551 Pain in right hip: Secondary | ICD-10-CM | POA: Diagnosis not present

## 2024-01-04 DIAGNOSIS — M25561 Pain in right knee: Secondary | ICD-10-CM | POA: Diagnosis not present

## 2024-01-04 DIAGNOSIS — M25551 Pain in right hip: Secondary | ICD-10-CM | POA: Diagnosis not present

## 2024-01-12 ENCOUNTER — Other Ambulatory Visit: Payer: Self-pay | Admitting: *Deleted

## 2024-01-12 DIAGNOSIS — I998 Other disorder of circulatory system: Secondary | ICD-10-CM

## 2024-01-20 DIAGNOSIS — M94261 Chondromalacia, right knee: Secondary | ICD-10-CM | POA: Diagnosis not present

## 2024-01-20 DIAGNOSIS — M25551 Pain in right hip: Secondary | ICD-10-CM | POA: Diagnosis not present

## 2024-01-25 ENCOUNTER — Ambulatory Visit (HOSPITAL_COMMUNITY)
Admission: RE | Admit: 2024-01-25 | Discharge: 2024-01-25 | Disposition: A | Discharge status: Home / Self Care | Payer: BC Managed Care – PPO | Source: Ambulatory Visit | Attending: Physician Assistant | Admitting: Physician Assistant

## 2024-01-25 ENCOUNTER — Ambulatory Visit: Payer: BC Managed Care – PPO

## 2024-01-25 DIAGNOSIS — I998 Other disorder of circulatory system: Secondary | ICD-10-CM | POA: Insufficient documentation

## 2024-01-27 DIAGNOSIS — D229 Melanocytic nevi, unspecified: Secondary | ICD-10-CM | POA: Diagnosis not present

## 2024-01-27 DIAGNOSIS — D485 Neoplasm of uncertain behavior of skin: Secondary | ICD-10-CM | POA: Diagnosis not present

## 2024-01-27 DIAGNOSIS — L578 Other skin changes due to chronic exposure to nonionizing radiation: Secondary | ICD-10-CM | POA: Diagnosis not present

## 2024-01-27 DIAGNOSIS — L814 Other melanin hyperpigmentation: Secondary | ICD-10-CM | POA: Diagnosis not present

## 2024-01-27 DIAGNOSIS — L821 Other seborrheic keratosis: Secondary | ICD-10-CM | POA: Diagnosis not present

## 2024-02-04 DIAGNOSIS — M25551 Pain in right hip: Secondary | ICD-10-CM | POA: Diagnosis not present

## 2024-02-07 DIAGNOSIS — D485 Neoplasm of uncertain behavior of skin: Secondary | ICD-10-CM | POA: Diagnosis not present

## 2024-02-07 DIAGNOSIS — D219 Benign neoplasm of connective and other soft tissue, unspecified: Secondary | ICD-10-CM | POA: Diagnosis not present

## 2024-02-07 DIAGNOSIS — L988 Other specified disorders of the skin and subcutaneous tissue: Secondary | ICD-10-CM | POA: Diagnosis not present

## 2024-02-15 DIAGNOSIS — G894 Chronic pain syndrome: Secondary | ICD-10-CM | POA: Diagnosis not present

## 2024-02-15 DIAGNOSIS — R000 Tachycardia, unspecified: Secondary | ICD-10-CM | POA: Diagnosis not present

## 2024-02-15 DIAGNOSIS — D539 Nutritional anemia, unspecified: Secondary | ICD-10-CM | POA: Diagnosis not present

## 2024-02-15 DIAGNOSIS — F418 Other specified anxiety disorders: Secondary | ICD-10-CM | POA: Diagnosis not present

## 2024-02-15 DIAGNOSIS — F902 Attention-deficit hyperactivity disorder, combined type: Secondary | ICD-10-CM | POA: Diagnosis not present

## 2024-02-15 DIAGNOSIS — F411 Generalized anxiety disorder: Secondary | ICD-10-CM | POA: Diagnosis not present

## 2024-02-15 DIAGNOSIS — Z862 Personal history of diseases of the blood and blood-forming organs and certain disorders involving the immune mechanism: Secondary | ICD-10-CM | POA: Diagnosis not present

## 2024-02-16 DIAGNOSIS — S73191A Other sprain of right hip, initial encounter: Secondary | ICD-10-CM | POA: Diagnosis not present

## 2024-02-29 DIAGNOSIS — M25551 Pain in right hip: Secondary | ICD-10-CM | POA: Diagnosis not present

## 2024-03-07 ENCOUNTER — Ambulatory Visit: Attending: Vascular Surgery | Admitting: Physician Assistant

## 2024-03-07 ENCOUNTER — Encounter: Payer: Self-pay | Admitting: Physician Assistant

## 2024-03-07 VITALS — BP 137/89 | HR 84 | Temp 98.4°F | Resp 18 | Ht 63.0 in | Wt 144.0 lb

## 2024-03-07 DIAGNOSIS — I70208 Unspecified atherosclerosis of native arteries of extremities, other extremity: Secondary | ICD-10-CM | POA: Diagnosis not present

## 2024-03-07 DIAGNOSIS — S73191S Other sprain of right hip, sequela: Secondary | ICD-10-CM | POA: Diagnosis not present

## 2024-03-07 DIAGNOSIS — I771 Stricture of artery: Secondary | ICD-10-CM

## 2024-03-07 DIAGNOSIS — M25551 Pain in right hip: Secondary | ICD-10-CM | POA: Diagnosis not present

## 2024-03-07 NOTE — Progress Notes (Signed)
 Office Note     CC:  follow up Requesting Provider:  Cleotilde, Virginia  E, PA  HPI: Michelle Villa is a 38 y.o. ([birth year]) female who presents for follow up left subclavian artery stent. She previously underwent left first rib resection and stenting of her left subclavian artery in March 2018 by Dr. Harvey. This was done for vascular thoracic outlet syndrome and preoperative arterial embolization of her left hand. She was last seen by Dr. Harvey in 2022 at which time she was having just minimal symptoms. She had some coolness and aching in her hand intermittently which would be exacerbated if she has a long day where she is doing a lot of heavy lifting. At one point there were discussions of possible upper extremity bypass, however with her minimal symptoms this was deferred.   Today she is here with her husband. reports overall arm is stable. She does have a labrum tear in her right hip and is actually being evaluated by [facility] today. She says this injury is aggravating her shoulder due to using crutches but otherwise the left arm has overall been stable. She says her hand still gets cold at times and she also will have pains intermittently and cramping if she uses her arm excessively. Otherwise at baseline she feels no change from when she was here 3 years ago. She continues to do her PT exercises that she was given.   Past Medical History:  Diagnosis Date   ADHD (attention deficit hyperactivity disorder)    Arterial thoracic outlet syndrome of left subclavian artery (HCC) 2018   followed by vascular--- dr fields (q82yrs);   arteriogram 09-11-2016  occluded left branchial, radial, ulnar arteries w/ multiple collaterals at the wrist and 70% narrowwing left subclavian artery;   09-28-2016   arterial thrombectomy left branchial / radial/ ulnar/ & intraosseous ;  patch angioplasty brachial artery ;  left first rib resection and left subclavian stent;   Chronic pelvic pain in female     Depression    GAD (generalized anxiety disorder)    GERD (gastroesophageal reflux disease)    watches diet   Heart palpitations    cardiologist--- dr calhoun;    stress echo w/ dobutamin 12-14-2019  normal w/ no ischemia, ef 60%   History of panic attacks    Hypertension    metoprolol    Impaired circulation 2018   compromised circulation to patient's left arm due to thoracic outlet syndrome   Multiple thyroid  nodules 2018   last ultrasound in epic 09-23-2016 shows multiple thyroid  nodules;  10-22-2016 biopsy left nodule and isthmus nodule,  both benign   Neuropathy of left hand    intermittant numbness/ tingling   Wears glasses    Wears partial dentures    upper only    Past Surgical History:  Procedure Laterality Date   AORTIC ARCH ANGIOGRAPHY N/A 09/11/2016   Procedure: Aortic Arch Angiography;  Surgeon: Carlin FORBES Harvey, MD;  Location: [facility] INVASIVE CV LAB;  Service: Cardiovascular;  Laterality: N/A;   EMBOLECTOMY Left 09/28/2016   Procedure: Left Brachial, radial, ulnar and intraosseous artery thrombectomy;  Surgeon: Carlin FORBES Harvey, MD;  Location: [facility] OR;  Service: Vascular;  Laterality: Left;   HYSTEROSCOPY N/A 12/28/2018   Procedure: HYSTEROSCOPY/D&C;  Surgeon: Rosalva Sawyer, MD;  Location: [facility] OR;  Service: Gynecology;  Laterality: N/A;   I & D EXTREMITY Left 09/28/2016   Procedure: EVACUATION HEMATOMA NECK;  Surgeon: Carlin FORBES Harvey, MD;  Location: [facility] OR;  Service: Vascular;  Laterality:  Left;   LAPAROSCOPIC VAGINAL HYSTERECTOMY WITH SALPINGO OOPHORECTOMY Bilateral 07/19/2023   Procedure: LAPAROSCOPIC ASSISTED VAGINAL HYSTERECTOMY WITH BILATERAL SALPINGECTOMY, RIGHT OOPHORECTOMY;  Surgeon: Dannielle Bouchard, DO;  Location: [facility];  Service: Gynecology;  Laterality: Bilateral;   LAPAROSCOPY Left 12/28/2018   Procedure: Operative Laparoscopy with left oophorectomy;  Surgeon: Rosalva Sawyer, MD;  Location: [facility] OR;  Service: Gynecology;  Laterality: Left;   PATCH ANGIOPLASTY Left  09/28/2016   Procedure: PATCH ANGIOPLASTY BRACHIAL ARTERY USING VIEN HARVESTED FROM PATIENT;  Surgeon: Carlin FORBES Haddock, MD;  Location: [facility] OR;  Service: Vascular;  Laterality: Left;   PERIPHERAL VASCULAR INTERVENTION Left 09/28/2016   Procedure: LEFT SUBCLAVIAN STENT;  Surgeon: Carlin FORBES Haddock, MD;  Location: [facility] OR;  Service: Vascular;  Laterality: Left;   RIB RESECTION Left 09/28/2016   Procedure: FIRST RIB RESECTION;  Surgeon: Carlin FORBES Haddock, MD;  Location: [facility] OR;  Service: Vascular;  Laterality: Left;   UPPER EXTREMITY ANGIOGRAPHY N/A 09/11/2016   Procedure: Upper Extremity Angiography - Left;  Surgeon: Carlin FORBES Haddock, MD;  Location: [facility] INVASIVE CV LAB;  Service: Cardiovascular;  Laterality: N/A;   UPPER EXTREMITY ANGIOGRAPHY Left 08/23/2019   Procedure: UPPER EXTREMITY ANGIOGRAPHY - Left;  Surgeon: Gretta Lonni PARAS, MD;  Location: [facility] INVASIVE CV LAB;  Service: Cardiovascular;  Laterality: Left;   WISDOM TOOTH EXTRACTION      Social History   Socioeconomic History   Marital status: Married    Spouse name: Not on file   Number of children: Not on file   Years of education: Not on file   Highest education level: Not on file  Occupational History   Occupation: waitress  Tobacco Use   Smoking status: Never   Smokeless tobacco: Never  Vaping Use   Vaping status: Never Used  Substance and Sexual Activity   Alcohol use: Not Currently    Comment: socially   Drug use: Never   Sexual activity: Not on file    Comment: husband had vasectomy  Other Topics Concern   Not on file  Social History Narrative   Not on file   Social Drivers of Health   Financial Resource Strain: Not on file  Food Insecurity: Not on file  Transportation Needs: Not on file  Physical Activity: Not on file  Stress: Not on file  Social Connections: Unknown (03/09/2022)   Received from [facility]   Social Network    Social Network: Not on file  Intimate Partner Violence: Unknown (03/09/2022)   Received  from [facility]   HITS    Physically Hurt: Not on file    Insult or Talk Down To: Not on file    Threaten Physical Harm: Not on file    Scream or Curse: Not on file    Family History  Problem Relation Age of Onset   Hypertension Father    Hyperlipidemia Father    Neuropathy Neg Hx     Current Outpatient Medications  Medication Sig Dispense Refill   ALPRAZolam  (XANAX ) 1 MG tablet Take 0.5-1 mg by mouth 2 (two) times daily as needed for anxiety.     amphetamine -dextroamphetamine  (ADDERALL XR) 30 MG 24 hr capsule Take 30 mg by mouth daily.     amphetamine -dextroamphetamine  (ADDERALL) 30 MG tablet Take 30 mg by mouth daily after lunch.     Biotin 5000 MCG CAPS Take 1 capsule by mouth daily.     Cyanocobalamin  (B-12) 1000 MCG CAPS Take 1 capsule by mouth daily.  diphenhydrAMINE  (BENADRYL ) 25 MG tablet Take 25 mg by mouth every 6 (six) hours as needed for itching.     hydroxypropyl methylcellulose / hypromellose (ISOPTO TEARS / GONIOVISC) 2.5 % ophthalmic solution Place 1 drop into both eyes 3 (three) times daily as needed for dry eyes.     ibuprofen  (ADVIL ) 600 MG tablet Take 1 tablet (600 mg total) by mouth every 6 (six) hours as needed. 30 tablet 0   metoprolol  succinate (TOPROL -XL) 50 MG 24 hr tablet TAKE 1 TABLET BY MOUTH DAILY WITH OR IMMEDIATELY FOLLOWING A MEAL 7 tablet 0   Multiple Vitamin (MULTIVITAMIN) capsule Take 1 capsule by mouth daily.      No current facility-administered medications for this visit.    Allergies  Allergen Reactions   Other Hives    Per pt excess of any type of CITRUS    Meloxicam Nausea Only     REVIEW OF SYSTEMS:  Negative unless noted in HPI [X]  denotes positive finding, [ ]  denotes negative finding Cardiac  Comments:  Chest pain or chest pressure:    Shortness of breath upon exertion:    Short of breath when lying flat:    Irregular heart rhythm:        Vascular    Pain in calf, thigh, or hip brought on by ambulation:    Pain in  feet at night that wakes you up from your sleep:     Blood clot in your veins:    Leg swelling:         Pulmonary    Oxygen at home:    Productive cough:     Wheezing:         Neurologic    Sudden weakness in arms or legs:     Sudden numbness in arms or legs:     Sudden onset of difficulty speaking or slurred speech:    Temporary loss of vision in one eye:     Problems with dizziness:         Gastrointestinal    Blood in stool:     Vomited blood:         Genitourinary    Burning when urinating:     Blood in urine:        Psychiatric    Major depression:         Hematologic    Bleeding problems:    Problems with blood clotting too easily:        Skin    Rashes or ulcers:        Constitutional    Fever or chills:      PHYSICAL EXAMINATION:  Vitals:   03/07/24 0903  BP: 137/89  Pulse: 84  Resp: 18  Temp: 98.4 F (36.9 C)     General:  WDWN in NAD; vital signs documented above Gait: using crutches HENT: WNL, normocephalic Pulmonary: normal non-labored breathing Cardiac: regular HR Abdomen: soft Vascular Exam/Pulses: 2+ brachial pulse, brisk radial and ulnar signals, doppler palmar arch signals Musculoskeletal: no muscle wasting or atrophy  Neurologic: A&O X 3 Psychiatric:  The pt has Normal affect.   Non-Invasive Vascular Imaging:   VAS Upper Extremity Arterial Duplex Left: 01/25/24 Summary:  Left: Patent left subclavian stent with no significant stenosis noted. Radial artery appears near-occluded in mid forearm; distal collateral flow seen.   ASSESSMENT/PLAN:: 38 y.o. female here for follow up for left subclavian artery stent. Left arm remains warm and well perfused. She does have some intermittent symptoms in  left hand but overall minimal. These have not changed since her last visit 3 years ago. - Duplex is essentially unchanged from prior in 2022. She has had known radial occlusion with distal reconstitution via the interosseus. The left subclavian  stent is patient.  - She can follow up again with left upper extremity arterial duplex in 3 years - She knows to call for earlier follow up should she develop new or concerning symptoms   Teretha Damme, PA-C [facility] [phone]  Clinic MD:   Tomie Gaskins

## 2024-03-10 DIAGNOSIS — S73191D Other sprain of right hip, subsequent encounter: Secondary | ICD-10-CM | POA: Diagnosis not present

## 2024-03-20 ENCOUNTER — Ambulatory Visit

## 2024-03-21 DIAGNOSIS — S73191A Other sprain of right hip, initial encounter: Secondary | ICD-10-CM | POA: Diagnosis not present

## 2024-04-25 DIAGNOSIS — F331 Major depressive disorder, recurrent, moderate: Secondary | ICD-10-CM | POA: Diagnosis not present

## 2024-05-05 DIAGNOSIS — F331 Major depressive disorder, recurrent, moderate: Secondary | ICD-10-CM | POA: Diagnosis not present

## 2024-05-08 DIAGNOSIS — M948X8 Other specified disorders of cartilage, other site: Secondary | ICD-10-CM | POA: Diagnosis not present

## 2024-05-08 DIAGNOSIS — M24151 Other articular cartilage disorders, right hip: Secondary | ICD-10-CM | POA: Diagnosis not present

## 2024-05-08 DIAGNOSIS — M1610 Unilateral primary osteoarthritis, unspecified hip: Secondary | ICD-10-CM | POA: Diagnosis not present

## 2024-05-08 DIAGNOSIS — M94251 Chondromalacia, right hip: Secondary | ICD-10-CM | POA: Diagnosis not present

## 2024-05-12 DIAGNOSIS — F331 Major depressive disorder, recurrent, moderate: Secondary | ICD-10-CM | POA: Diagnosis not present

## 2024-05-15 DIAGNOSIS — M25551 Pain in right hip: Secondary | ICD-10-CM | POA: Diagnosis not present

## 2024-05-15 DIAGNOSIS — M25561 Pain in right knee: Secondary | ICD-10-CM | POA: Diagnosis not present

## 2024-05-17 DIAGNOSIS — F418 Other specified anxiety disorders: Secondary | ICD-10-CM | POA: Diagnosis not present

## 2024-05-17 DIAGNOSIS — F411 Generalized anxiety disorder: Secondary | ICD-10-CM | POA: Diagnosis not present

## 2024-05-17 DIAGNOSIS — F331 Major depressive disorder, recurrent, moderate: Secondary | ICD-10-CM | POA: Diagnosis not present

## 2024-05-17 DIAGNOSIS — F902 Attention-deficit hyperactivity disorder, combined type: Secondary | ICD-10-CM | POA: Diagnosis not present

## 2024-05-17 DIAGNOSIS — G894 Chronic pain syndrome: Secondary | ICD-10-CM | POA: Diagnosis not present

## 2024-05-18 DIAGNOSIS — M25551 Pain in right hip: Secondary | ICD-10-CM | POA: Diagnosis not present

## 2024-05-18 DIAGNOSIS — M25561 Pain in right knee: Secondary | ICD-10-CM | POA: Diagnosis not present

## 2024-05-22 DIAGNOSIS — M25551 Pain in right hip: Secondary | ICD-10-CM | POA: Diagnosis not present

## 2024-05-22 DIAGNOSIS — M25561 Pain in right knee: Secondary | ICD-10-CM | POA: Diagnosis not present

## 2024-05-25 DIAGNOSIS — M25551 Pain in right hip: Secondary | ICD-10-CM | POA: Diagnosis not present

## 2024-05-25 DIAGNOSIS — M25561 Pain in right knee: Secondary | ICD-10-CM | POA: Diagnosis not present

## 2024-05-31 DIAGNOSIS — M25551 Pain in right hip: Secondary | ICD-10-CM | POA: Diagnosis not present

## 2024-05-31 DIAGNOSIS — M25561 Pain in right knee: Secondary | ICD-10-CM | POA: Diagnosis not present

## 2024-06-02 DIAGNOSIS — M25551 Pain in right hip: Secondary | ICD-10-CM | POA: Diagnosis not present

## 2024-06-02 DIAGNOSIS — M25561 Pain in right knee: Secondary | ICD-10-CM | POA: Diagnosis not present

## 2024-06-05 DIAGNOSIS — M25561 Pain in right knee: Secondary | ICD-10-CM | POA: Diagnosis not present

## 2024-06-05 DIAGNOSIS — M25551 Pain in right hip: Secondary | ICD-10-CM | POA: Diagnosis not present

## 2024-06-07 DIAGNOSIS — M25551 Pain in right hip: Secondary | ICD-10-CM | POA: Diagnosis not present

## 2024-06-07 DIAGNOSIS — M25561 Pain in right knee: Secondary | ICD-10-CM | POA: Diagnosis not present

## 2024-06-12 DIAGNOSIS — M25551 Pain in right hip: Secondary | ICD-10-CM | POA: Diagnosis not present

## 2024-06-12 DIAGNOSIS — M25561 Pain in right knee: Secondary | ICD-10-CM | POA: Diagnosis not present

## 2024-06-14 DIAGNOSIS — M25551 Pain in right hip: Secondary | ICD-10-CM | POA: Diagnosis not present

## 2024-06-14 DIAGNOSIS — M25561 Pain in right knee: Secondary | ICD-10-CM | POA: Diagnosis not present

## 2024-06-20 DIAGNOSIS — M25551 Pain in right hip: Secondary | ICD-10-CM | POA: Diagnosis not present

## 2024-06-20 DIAGNOSIS — M25561 Pain in right knee: Secondary | ICD-10-CM | POA: Diagnosis not present

## 2024-06-22 DIAGNOSIS — M25561 Pain in right knee: Secondary | ICD-10-CM | POA: Diagnosis not present

## 2024-06-22 DIAGNOSIS — M25551 Pain in right hip: Secondary | ICD-10-CM | POA: Diagnosis not present

## 2024-06-28 DIAGNOSIS — M25561 Pain in right knee: Secondary | ICD-10-CM | POA: Diagnosis not present

## 2024-06-28 DIAGNOSIS — M25551 Pain in right hip: Secondary | ICD-10-CM | POA: Diagnosis not present
